# Patient Record
Sex: Female | Born: 1955 | Race: White | Hispanic: No | Marital: Married | Smoking: Never smoker
Health system: Southern US, Community
[De-identification: ages and names within clinical notes are randomized; demographics above are authoritative.]

## PROBLEM LIST (undated history)

## (undated) DIAGNOSIS — E785 Hyperlipidemia, unspecified: Secondary | ICD-10-CM

## (undated) DIAGNOSIS — Z87442 Personal history of urinary calculi: Secondary | ICD-10-CM

## (undated) DIAGNOSIS — I10 Essential (primary) hypertension: Secondary | ICD-10-CM

## (undated) DIAGNOSIS — N6310 Unspecified lump in the right breast, unspecified quadrant: Secondary | ICD-10-CM

## (undated) DIAGNOSIS — E119 Type 2 diabetes mellitus without complications: Secondary | ICD-10-CM

## (undated) HISTORY — PX: TUBAL LIGATION: SHX77

## (undated) HISTORY — DX: Essential (primary) hypertension: I10

## (undated) HISTORY — PX: ABDOMINAL HYSTERECTOMY: SHX81

## (undated) HISTORY — PX: OTHER SURGICAL HISTORY: SHX169

## (undated) HISTORY — DX: Hyperlipidemia, unspecified: E78.5

---

## 2004-06-15 ENCOUNTER — Other Ambulatory Visit: Admission: RE | Admit: 2004-06-15 | Discharge: 2004-06-15 | Payer: Self-pay | Admitting: Obstetrics & Gynecology

## 2012-08-21 ENCOUNTER — Other Ambulatory Visit: Payer: Self-pay

## 2012-08-21 MED ORDER — HYDROCHLOROTHIAZIDE 25 MG PO TABS: 25.0000 mg | ORAL_TABLET | Freq: Every day | ORAL | Status: DC

## 2012-12-21 ENCOUNTER — Ambulatory Visit: Payer: Self-pay | Admitting: General Practice

## 2012-12-25 ENCOUNTER — Other Ambulatory Visit: Payer: Self-pay | Admitting: Family Medicine

## 2012-12-26 NOTE — Telephone Encounter (Signed)
RX called in for HCTZ for one refill Pt must be seen for further refills

## 2012-12-26 NOTE — Telephone Encounter (Signed)
This is okay refill x1. She needs to make an appointment to be seen by a provider here.

## 2012-12-26 NOTE — Telephone Encounter (Signed)
Last seen 06/25/12   DWM 

## 2013-01-22 ENCOUNTER — Other Ambulatory Visit: Payer: Self-pay | Admitting: Family Medicine

## 2013-01-24 NOTE — Telephone Encounter (Signed)
Last seen 06/25/12   DWM

## 2013-01-24 NOTE — Telephone Encounter (Signed)
The patient needs to make an appointment for followup by a provider here in this practice

## 2013-01-25 NOTE — Telephone Encounter (Signed)
[facility] notified of rx for HCTZ

## 2013-02-07 ENCOUNTER — Ambulatory Visit: Payer: Self-pay | Admitting: General Practice

## 2013-02-08 ENCOUNTER — Encounter (INDEPENDENT_AMBULATORY_CARE_PROVIDER_SITE_OTHER): Payer: Self-pay

## 2013-02-08 ENCOUNTER — Encounter: Payer: Self-pay | Admitting: General Practice

## 2013-02-08 ENCOUNTER — Ambulatory Visit (INDEPENDENT_AMBULATORY_CARE_PROVIDER_SITE_OTHER): Payer: BC Managed Care – PPO | Admitting: General Practice

## 2013-02-08 VITALS — BP 116/72 | HR 77 | Temp 97.0°F | Ht 64.0 in | Wt 234.0 lb

## 2013-02-08 DIAGNOSIS — I10 Essential (primary) hypertension: Secondary | ICD-10-CM

## 2013-02-08 DIAGNOSIS — K219 Gastro-esophageal reflux disease without esophagitis: Secondary | ICD-10-CM

## 2013-02-08 DIAGNOSIS — M5430 Sciatica, unspecified side: Secondary | ICD-10-CM

## 2013-02-08 DIAGNOSIS — E785 Hyperlipidemia, unspecified: Secondary | ICD-10-CM

## 2013-02-08 DIAGNOSIS — M5431 Sciatica, right side: Secondary | ICD-10-CM

## 2013-02-08 DIAGNOSIS — M2550 Pain in unspecified joint: Secondary | ICD-10-CM

## 2013-02-08 MED ORDER — METHYLPREDNISOLONE ACETATE 80 MG/ML IJ SUSP
80.0000 mg | Freq: Once | INTRAMUSCULAR | Status: AC
  Administered 2013-02-08: 80 mg via INTRAMUSCULAR

## 2013-02-08 MED ORDER — RAMIPRIL 10 MG PO CAPS: 10.0000 mg | ORAL_CAPSULE | Freq: Every day | ORAL | Status: DC

## 2013-02-08 MED ORDER — SIMVASTATIN 80 MG PO TABS: 40.0000 mg | ORAL_TABLET | Freq: Every day | ORAL | Status: DC

## 2013-02-08 MED ORDER — HYDROCHLOROTHIAZIDE 25 MG PO TABS
25.0000 mg | ORAL_TABLET | Freq: Every day | ORAL | Status: DC
Start: 2013-02-08 — End: 2013-07-18

## 2013-02-08 NOTE — Progress Notes (Signed)
  Subjective:    Patient ID: Alexandria Tucker, female    DOB: 1955/10/09, 57 y.o.   MRN: [id number]  HPI Patient presents today for chronic health check. She has a history of hypertension and hyperlipidemia. Reports taking medications as prescribed. Reports lower back began hurting last night. She denies known injury and denies OTC medications. Reports ibuprofen alleviates the pain and taken as directed, OTC.    Review of Systems  Constitutional: Negative for fever and chills.  HENT: Negative for congestion.   Respiratory: Negative for cough, chest tightness, shortness of breath and wheezing.   Cardiovascular: Negative for chest pain and palpitations.  Gastrointestinal: Negative for abdominal pain, diarrhea, constipation and blood in stool.  Genitourinary: Negative for dysuria, hematuria and difficulty urinating.  Musculoskeletal: Positive for back pain. Negative for neck pain and neck stiffness.       Slight pain/tenderness  Neurological: Negative for dizziness, weakness and headaches.       Objective:   Physical Exam  Constitutional: She is oriented to person, place, and time. She appears well-developed and well-nourished.  HENT:  Head: Normocephalic and atraumatic.  Right Ear: External ear normal.  Left Ear: External ear normal.  Nose: Nose normal.  Mouth/Throat: Oropharynx is clear and moist.  Eyes: EOM are normal. Pupils are equal, round, and reactive to light.  Neck: Normal range of motion. Neck supple. No thyromegaly present.  Cardiovascular: Normal rate, regular rhythm and normal heart sounds.   Pulmonary/Chest: Effort normal and breath sounds normal. No respiratory distress. She exhibits no tenderness.  Abdominal: Soft. Bowel sounds are normal. She exhibits no distension. There is no tenderness.  Musculoskeletal: She exhibits tenderness. She exhibits no edema.  Lymphadenopathy:    She has no cervical adenopathy.  Neurological: She is alert and oriented to person, place,  and time.  Skin: Skin is warm and dry.  Psychiatric: She has a normal mood and affect.          Assessment & Plan:  1. Hypertension  - CMP14+EGFR - hydrochlorothiazide (HYDRODIURIL) 25 MG tablet; Take 1 tablet (25 mg total) by mouth daily.  Dispense: 30 tablet; Refill: 3 - ramipril (ALTACE) 10 MG capsule; Take 1 capsule (10 mg total) by mouth daily.  Dispense: 30 capsule; Refill: 3  2. Other and unspecified hyperlipidemia  - NMR, lipoprofile  3. Arthritic-like pain  - Arthritis Panel - simvastatin (ZOCOR) 80 MG tablet; Take 0.5 tablets (40 mg total) by mouth at bedtime.  Dispense: 30 tablet; Refill: 3  4. Sciatica, right  - methylPREDNISolone acetate (DEPO-MEDROL) injection 80 mg; Inject 1 mL (80 mg total) into the muscle once.  5. GERD (gastroesophageal reflux disease) -Continue all current medications Labs pending F/u in 3 months Discussed exercise and diet  Patient verbalized understanding Coralie Keens, FNP-C

## 2013-02-08 NOTE — Patient Instructions (Signed)
Sciatica °Sciatica is pain, weakness, numbness, or tingling along the path of the sciatic nerve. The nerve starts in the lower back and runs down the back of each leg. The nerve controls the muscles in the lower leg and in the back of the knee, while also providing sensation to the back of the thigh, lower leg, and the sole of your foot. Sciatica is a symptom of another medical condition. For instance, nerve damage or certain conditions, such as a herniated disk or bone spur on the spine, pinch or put pressure on the sciatic nerve. This causes the pain, weakness, or other sensations normally associated with sciatica. Generally, sciatica only affects one side of the body. °CAUSES  °· Herniated or slipped disc. °· Degenerative disk disease. °· A pain disorder involving the narrow muscle in the buttocks (piriformis syndrome). °· Pelvic injury or fracture. °· Pregnancy. °· Tumor (rare). °SYMPTOMS  °Symptoms can vary from mild to very severe. The symptoms usually travel from the low back to the buttocks and down the back of the leg. Symptoms can include: °· Mild tingling or dull aches in the lower back, leg, or hip. °· Numbness in the back of the calf or sole of the foot. °· Burning sensations in the lower back, leg, or hip. °· Sharp pains in the lower back, leg, or hip. °· Leg weakness. °· Severe back pain inhibiting movement. °These symptoms may get worse with coughing, sneezing, laughing, or prolonged sitting or standing. Also, being overweight may worsen symptoms. °DIAGNOSIS  °Your caregiver will perform a physical exam to look for common symptoms of sciatica. He or she may ask you to do certain movements or activities that would trigger sciatic nerve pain. Other tests may be performed to find the cause of the sciatica. These may include: °· Blood tests. °· X-rays. °· Imaging tests, such as an MRI or CT scan. °TREATMENT  °Treatment is directed at the cause of the sciatic pain. Sometimes, treatment is not necessary  and the pain and discomfort goes away on its own. If treatment is needed, your caregiver may suggest: °· Over-the-counter medicines to relieve pain. °· Prescription medicines, such as anti-inflammatory medicine, muscle relaxants, or narcotics. °· Applying heat or ice to the painful area. °· Steroid injections to lessen pain, irritation, and inflammation around the nerve. °· Reducing activity during periods of pain. °· Exercising and stretching to strengthen your abdomen and improve flexibility of your spine. Your caregiver may suggest losing weight if the extra weight makes the back pain worse. °· Physical therapy. °· Surgery to eliminate what is pressing or pinching the nerve, such as a bone spur or part of a herniated disk. °HOME CARE INSTRUCTIONS  °· Only take over-the-counter or prescription medicines for pain or discomfort as directed by your caregiver. °· Apply ice to the affected area for 20 minutes, 3 4 times a day for the first 48 72 hours. Then try heat in the same way. °· Exercise, stretch, or perform your usual activities if these do not aggravate your pain. °· Attend physical therapy sessions as directed by your caregiver. °· Keep all follow-up appointments as directed by your caregiver. °· Do not wear high heels or shoes that do not provide proper support. °· Check your mattress to see if it is too soft. A firm mattress may lessen your pain and discomfort. °SEEK IMMEDIATE MEDICAL CARE IF:  °· You lose control of your bowel or bladder (incontinence). °· You have increasing weakness in the lower back,   pelvis, buttocks, or legs. °· You have redness or swelling of your back. °· You have a burning sensation when you urinate. °· You have pain that gets worse when you lie down or awakens you at night. °· Your pain is worse than you have experienced in the past. °· Your pain is lasting longer than 4 weeks. °· You are suddenly losing weight without reason. °MAKE SURE YOU: °· Understand these  instructions. °· Will watch your condition. °· Will get help right away if you are not doing well or get worse. °Document Released: 04/12/2001 Document Revised: 10/18/2011 Document Reviewed: 08/28/2011 °[employer]® Patient Information ©2014 [employer]. ° °

## 2013-02-10 LAB — NMR, LIPOPROFILE
Cholesterol: 168 mg/dL (ref <200)
HDL Cholesterol by NMR: 35 mg/dL — ABNORMAL LOW (ref >=40)
HDL Particle Number: 33.2 umol/L (ref >=30.5)
LP-IR Score: 83 — ABNORMAL HIGH (ref <=45)
Small LDL Particle Number: 1691 nmol/L — ABNORMAL HIGH (ref <=527)

## 2013-02-10 LAB — CMP14+EGFR
AST: 43 IU/L — ABNORMAL HIGH (ref 0–40)
Albumin/Globulin Ratio: 1.7 (ref 1.1–2.5)
Alkaline Phosphatase: 92 IU/L (ref 39–117)
BUN/Creatinine Ratio: 25 — ABNORMAL HIGH (ref 9–23)
Creatinine, Ser: 0.73 mg/dL (ref 0.57–1.00)
GFR calc non Af Amer: 93 mL/min/{1.73_m2} (ref >59)
Globulin, Total: 2.7 g/dL (ref 1.5–4.5)
Sodium: 140 mmol/L (ref 134–144)

## 2013-02-10 LAB — ARTHRITIS PANEL: Uric Acid: 5 mg/dL (ref 2.5–7.1)

## 2013-02-15 ENCOUNTER — Telehealth: Payer: Self-pay | Admitting: *Deleted

## 2013-02-15 NOTE — Telephone Encounter (Signed)
Please review labs.  Call and leave results on vm and then mail a copy to her. Thanks.

## 2013-02-19 NOTE — Telephone Encounter (Signed)
Spoke with patient and reviewed labs. Patient verbalized understanding and denies questions.

## 2013-05-07 ENCOUNTER — Telehealth: Payer: Self-pay | Admitting: General Practice

## 2013-05-07 NOTE — Telephone Encounter (Signed)
appt for Friday with Mae given

## 2013-05-10 ENCOUNTER — Ambulatory Visit (INDEPENDENT_AMBULATORY_CARE_PROVIDER_SITE_OTHER): Payer: BC Managed Care – PPO

## 2013-05-10 ENCOUNTER — Ambulatory Visit (INDEPENDENT_AMBULATORY_CARE_PROVIDER_SITE_OTHER): Payer: BC Managed Care – PPO | Admitting: General Practice

## 2013-05-10 ENCOUNTER — Encounter: Payer: Self-pay | Admitting: General Practice

## 2013-05-10 VITALS — BP 123/78 | HR 87 | Temp 97.7°F | Wt 241.0 lb

## 2013-05-10 DIAGNOSIS — M79604 Pain in right leg: Secondary | ICD-10-CM

## 2013-05-10 DIAGNOSIS — M5430 Sciatica, unspecified side: Secondary | ICD-10-CM

## 2013-05-10 DIAGNOSIS — M549 Dorsalgia, unspecified: Secondary | ICD-10-CM

## 2013-05-10 DIAGNOSIS — N390 Urinary tract infection, site not specified: Secondary | ICD-10-CM

## 2013-05-10 DIAGNOSIS — M79609 Pain in unspecified limb: Secondary | ICD-10-CM

## 2013-05-10 DIAGNOSIS — M5431 Sciatica, right side: Secondary | ICD-10-CM

## 2013-05-10 LAB — POCT UA - MICROSCOPIC ONLY
BACTERIA, U MICROSCOPIC: NEGATIVE
Casts, Ur, LPF, POC: NEGATIVE
Crystals, Ur, HPF, POC: NEGATIVE
Mucus, UA: NEGATIVE
RBC, URINE, MICROSCOPIC: NEGATIVE

## 2013-05-10 LAB — POCT URINALYSIS DIPSTICK
BILIRUBIN UA: NEGATIVE
GLUCOSE UA: 250
KETONES UA: NEGATIVE
NITRITE UA: NEGATIVE
Protein, UA: NEGATIVE
RBC UA: NEGATIVE
Spec Grav, UA: 1.025
Urobilinogen, UA: NEGATIVE
pH, UA: 7

## 2013-05-10 MED ORDER — METAXALONE 800 MG PO TABS: 800.0000 mg | ORAL_TABLET | Freq: Three times a day (TID) | ORAL | Status: DC

## 2013-05-10 MED ORDER — PREDNISONE (PAK) 10 MG PO TABS: ORAL_TABLET | ORAL | Status: DC

## 2013-05-10 MED ORDER — CIPROFLOXACIN HCL 500 MG PO TABS: 500.0000 mg | ORAL_TABLET | Freq: Two times a day (BID) | ORAL | Status: DC

## 2013-05-10 MED ORDER — METHYLPREDNISOLONE ACETATE 80 MG/ML IJ SUSP
80.0000 mg | Freq: Once | INTRAMUSCULAR | Status: AC
  Administered 2013-05-10: 80 mg via INTRAMUSCULAR

## 2013-05-10 NOTE — Patient Instructions (Addendum)
Sciatica °Sciatica is pain, weakness, numbness, or tingling along the path of the sciatic nerve. The nerve starts in the lower back and runs down the back of each leg. The nerve controls the muscles in the lower leg and in the back of the knee, while also providing sensation to the back of the thigh, lower leg, and the sole of your foot. Sciatica is a symptom of another medical condition. For instance, nerve damage or certain conditions, such as a herniated disk or bone spur on the spine, pinch or put pressure on the sciatic nerve. This causes the pain, weakness, or other sensations normally associated with sciatica. Generally, sciatica only affects one side of the body. °CAUSES  °· Herniated or slipped disc. °· Degenerative disk disease. °· A pain disorder involving the narrow muscle in the buttocks (piriformis syndrome). °· Pelvic injury or fracture. °· Pregnancy. °· Tumor (rare). °SYMPTOMS  °Symptoms can vary from mild to very severe. The symptoms usually travel from the low back to the buttocks and down the back of the leg. Symptoms can include: °· Mild tingling or dull aches in the lower back, leg, or hip. °· Numbness in the back of the calf or sole of the foot. °· Burning sensations in the lower back, leg, or hip. °· Sharp pains in the lower back, leg, or hip. °· Leg weakness. °· Severe back pain inhibiting movement. °These symptoms may get worse with coughing, sneezing, laughing, or prolonged sitting or standing. Also, being overweight may worsen symptoms. °DIAGNOSIS  °Your caregiver will perform a physical exam to look for common symptoms of sciatica. He or she may ask you to do certain movements or activities that would trigger sciatic nerve pain. Other tests may be performed to find the cause of the sciatica. These may include: °· Blood tests. °· X-rays. °· Imaging tests, such as an MRI or CT scan. °TREATMENT  °Treatment is directed at the cause of the sciatic pain. Sometimes, treatment is not necessary  and the pain and discomfort goes away on its own. If treatment is needed, your caregiver may suggest: °· Over-the-counter medicines to relieve pain. °· Prescription medicines, such as anti-inflammatory medicine, muscle relaxants, or narcotics. °· Applying heat or ice to the painful area. °· Steroid injections to lessen pain, irritation, and inflammation around the nerve. °· Reducing activity during periods of pain. °· Exercising and stretching to strengthen your abdomen and improve flexibility of your spine. Your caregiver may suggest losing weight if the extra weight makes the back pain worse. °· Physical therapy. °· Surgery to eliminate what is pressing or pinching the nerve, such as a bone spur or part of a herniated disk. °HOME CARE INSTRUCTIONS  °· Only take over-the-counter or prescription medicines for pain or discomfort as directed by your caregiver. °· Apply ice to the affected area for 20 minutes, 3 4 times a day for the first 48 72 hours. Then try heat in the same way. °· Exercise, stretch, or perform your usual activities if these do not aggravate your pain. °· Attend physical therapy sessions as directed by your caregiver. °· Keep all follow-up appointments as directed by your caregiver. °· Do not wear high heels or shoes that do not provide proper support. °· Check your mattress to see if it is too soft. A firm mattress may lessen your pain and discomfort. °SEEK IMMEDIATE MEDICAL CARE IF:  °· You lose control of your bowel or bladder (incontinence). °· You have increasing weakness in the lower back,   pelvis, buttocks, or legs. °· You have redness or swelling of your back. °· You have a burning sensation when you urinate. °· You have pain that gets worse when you lie down or awakens you at night. °· Your pain is worse than you have experienced in the past. °· Your pain is lasting longer than 4 weeks. °· You are suddenly losing weight without reason. °MAKE SURE YOU: °· Understand these  instructions. °· Will watch your condition. °· Will get help right away if you are not doing well or get worse. °Document Released: 04/12/2001 Document Revised: 10/18/2011 Document Reviewed: 08/28/2011 °[employer]® Patient Information ©2014 [employer]. ° °

## 2013-05-10 NOTE — Progress Notes (Signed)
Subjective:    Patient ID: Alexandria Tucker, female    DOB: Sep 12, 1955, 58 y.o.   MRN: [id number]  Back Pain This is a recurrent problem. The current episode started more than 1 month ago (onset 3 months ago, worse over past couple days). The problem has been waxing and waning since onset. The pain is present in the sacro-iliac. The quality of the pain is described as aching. The pain radiates to the right thigh. The pain is the same all the time. The symptoms are aggravated by twisting. Associated symptoms include leg pain. Pertinent negatives include no bladder incontinence, bowel incontinence, chest pain, dysuria or numbness. She has tried NSAIDs and heat for the symptoms.      Review of Systems  Respiratory: Negative for chest tightness and shortness of breath.   Cardiovascular: Negative for chest pain.  Gastrointestinal: Negative for bowel incontinence.  Genitourinary: Negative for bladder incontinence and dysuria.  Musculoskeletal: Positive for back pain.  Neurological: Negative for numbness.       Objective:   Physical Exam  Constitutional: She is oriented to person, place, and time. She appears well-developed and well-nourished.  Cardiovascular: Normal rate, regular rhythm and normal heart sounds.   Pulmonary/Chest: Effort normal and breath sounds normal. No respiratory distress. She exhibits no tenderness.  Abdominal: Soft. Bowel sounds are normal. She exhibits no distension. There is no tenderness.  Neurological: She is alert and oriented to person, place, and time.  Skin: Skin is warm and dry.  Psychiatric: She has a normal mood and affect.      Results for orders placed in visit on 05/10/13  POCT URINALYSIS DIPSTICK      Result Value Range   Color, UA yellow     Clarity, UA clear     Glucose, UA 250     Bilirubin, UA neg     Ketones, UA neg     Spec Grav, UA 1.025     Blood, UA neg     pH, UA 7.0     Protein, UA neg     Urobilinogen, UA negative     Nitrite, UA neg     Leukocytes, UA moderate (2+)    POCT UA - MICROSCOPIC ONLY      Result Value Range   WBC, Ur, HPF, POC 10-15     RBC, urine, microscopic neg     Bacteria, U Microscopic neg     Mucus, UA neg     Epithelial cells, urine per micros occ     Crystals, Ur, HPF, POC neg     Casts, Ur, LPF, POC neg     Yeast, UA rare     [facility] reading (PRIMARY) by Erby Pian, FNP-C, degenerative disc disease, curvature noted.      Assessment & Plan:  1. Back pain  - POCT urinalysis dipstick - POCT UA - Microscopic Only - DG Lumbar Spine Complete; Future - metaxalone (SKELAXIN) 800 MG tablet; Take 1 tablet (800 mg total) by mouth 3 (three) times daily.  Dispense: 30 tablet; Refill: 0  2. Right leg pain  - DG Lumbar Spine Complete; Future  3. Sciatica of right side  - methylPREDNISolone acetate (DEPO-MEDROL) injection 80 mg; Inject 1 mL (80 mg total) into the muscle once. - predniSONE (STERAPRED UNI-PAK) 10 MG tablet; Start on 05/11/13  Dispense: 21 tablet; Refill: 0 -if symptoms worsen or unresolved, patient to contact her ortho specialist, will refer if needed  4. UTI (urinary tract infection)  -  ciprofloxacin (CIPRO) 500 MG tablet; Take 1 tablet (500 mg total) by mouth 2 (two) times daily.  Dispense: 20 tablet; Refill: 0 -Patient education discussed and provided (UTI, Sciatica) RTO if symptoms worsen or no improvement may seek emergency medical treatment Patient verbalized understanding Erby Pian, FNP-C

## 2013-05-16 ENCOUNTER — Telehealth: Payer: Self-pay | Admitting: General Practice

## 2013-05-16 ENCOUNTER — Other Ambulatory Visit: Payer: Self-pay | Admitting: General Practice

## 2013-05-16 DIAGNOSIS — M549 Dorsalgia, unspecified: Secondary | ICD-10-CM

## 2013-05-16 DIAGNOSIS — N390 Urinary tract infection, site not specified: Secondary | ICD-10-CM

## 2013-05-16 MED ORDER — KETOROLAC TROMETHAMINE 10 MG PO TABS: 10.0000 mg | ORAL_TABLET | Freq: Four times a day (QID) | ORAL | Status: DC | PRN

## 2013-05-16 MED ORDER — NITROFURANTOIN MONOHYD MACRO 100 MG PO CAPS: 100.0000 mg | ORAL_CAPSULE | Freq: Two times a day (BID) | ORAL | Status: DC

## 2013-05-16 NOTE — Telephone Encounter (Signed)
Spoke with patient and informed that macrobid, for uti and toradol for back pain has been sent to pharmacy. Discussed referral to ortho specialist and she verbalized, she will contact ortho specialist used in the past for appointment. She will inform me if referral is needed.

## 2013-05-17 ENCOUNTER — Telehealth: Payer: Self-pay | Admitting: General Practice

## 2013-05-17 ENCOUNTER — Other Ambulatory Visit: Payer: Self-pay | Admitting: General Practice

## 2013-05-17 DIAGNOSIS — M549 Dorsalgia, unspecified: Secondary | ICD-10-CM

## 2013-05-17 MED ORDER — NAPROXEN 500 MG PO TABS: 500.0000 mg | ORAL_TABLET | Freq: Two times a day (BID) | ORAL | Status: DC

## 2013-05-17 NOTE — Telephone Encounter (Signed)
Patient aware.

## 2013-05-17 NOTE — Telephone Encounter (Signed)
Please inform naproxen called into pharmacy for pain.

## 2013-06-02 LAB — HM MAMMOGRAPHY

## 2013-06-21 ENCOUNTER — Encounter: Payer: Self-pay | Admitting: Nurse Practitioner

## 2013-06-21 ENCOUNTER — Ambulatory Visit (INDEPENDENT_AMBULATORY_CARE_PROVIDER_SITE_OTHER): Payer: BC Managed Care – PPO | Admitting: Nurse Practitioner

## 2013-06-21 VITALS — BP 121/77 | HR 81 | Temp 97.1°F | Ht 64.0 in | Wt 241.0 lb

## 2013-06-21 DIAGNOSIS — I152 Hypertension secondary to endocrine disorders: Secondary | ICD-10-CM | POA: Insufficient documentation

## 2013-06-21 DIAGNOSIS — E1159 Type 2 diabetes mellitus with other circulatory complications: Secondary | ICD-10-CM | POA: Insufficient documentation

## 2013-06-21 DIAGNOSIS — J209 Acute bronchitis, unspecified: Secondary | ICD-10-CM

## 2013-06-21 DIAGNOSIS — E1169 Type 2 diabetes mellitus with other specified complication: Secondary | ICD-10-CM | POA: Insufficient documentation

## 2013-06-21 DIAGNOSIS — I10 Essential (primary) hypertension: Secondary | ICD-10-CM

## 2013-06-21 DIAGNOSIS — E785 Hyperlipidemia, unspecified: Secondary | ICD-10-CM

## 2013-06-21 DIAGNOSIS — J0100 Acute maxillary sinusitis, unspecified: Secondary | ICD-10-CM

## 2013-06-21 MED ORDER — HYDROCODONE-HOMATROPINE 5-1.5 MG/5ML PO SYRP: 5.0000 mL | ORAL_SOLUTION | Freq: Three times a day (TID) | ORAL | Status: DC | PRN

## 2013-06-21 MED ORDER — METHYLPREDNISOLONE ACETATE 80 MG/ML IJ SUSP
80.0000 mg | Freq: Once | INTRAMUSCULAR | Status: AC
  Administered 2013-06-21: 80 mg via INTRAMUSCULAR

## 2013-06-21 MED ORDER — AZITHROMYCIN 250 MG PO TABS: ORAL_TABLET | ORAL | Status: DC

## 2013-06-21 NOTE — Patient Instructions (Signed)

## 2013-06-21 NOTE — Progress Notes (Signed)
Subjective:    Patient ID: Alexandria Tucker, female    DOB: [birth year], 58 y.o.   MRN: [id number]  HPI  Patient in today c/o bil ear pain, sinus pressure and coughing- STarted last weekend and has gradually gotten worse.    Review of Systems  Constitutional: Negative for fever and chills.  HENT: Positive for congestion, ear pain, rhinorrhea and sinus pressure. Negative for sore throat and trouble swallowing.   Respiratory: Positive for cough (nonproductive).   Cardiovascular: Negative.   Gastrointestinal: Negative.   Genitourinary: Negative.   Musculoskeletal: Negative.   All other systems reviewed and are negative.       Objective:   Physical Exam  Constitutional: She is oriented to person, place, and time. She appears well-developed and well-nourished.  HENT:  Right Ear: Hearing, tympanic membrane, external ear and ear canal normal.  Left Ear: External ear and ear canal normal. Tympanic membrane is erythematous.  Nose: Mucosal edema and rhinorrhea present. Right sinus exhibits no maxillary sinus tenderness and no frontal sinus tenderness. Left sinus exhibits maxillary sinus tenderness. Left sinus exhibits no frontal sinus tenderness.  Mouth/Throat: Uvula is midline, oropharynx is clear and moist and mucous membranes are normal.  Eyes: Pupils are equal, round, and reactive to light.  Neck: Normal range of motion. Neck supple.  Cardiovascular: Normal rate, regular rhythm and normal heart sounds.   Pulmonary/Chest: Effort normal and breath sounds normal.  Lymphadenopathy:    She has no cervical adenopathy.  Neurological: She is alert and oriented to person, place, and time.  Skin: Skin is warm.  Psychiatric: She has a normal mood and affect. Her behavior is normal. Judgment and thought content normal.    BP 121/77  Pulse 81  Temp(Src) 97.1 F (36.2 C) (Oral)  Ht 5\' 4"  (1.626 m)  Wt 241 lb (109.317 kg)  BMI 41.35 kg/m2.       Assessment & Plan:   1. Sinusitis,  acute maxillary   2. Acute bronchitis    Meds ordered this encounter  Medications  . azithromycin (ZITHROMAX Z-PAK) 250 MG tablet    Sig: As directed    Dispense:  6 each    Refill:  0    Order Specific Question:  Supervising Provider    Answer:  Chipper Herb [[id number]]  . HYDROcodone-homatropine (HYCODAN) 5-1.5 MG/5ML syrup    Sig: Take 5 mLs by mouth every 8 (eight) hours as needed for cough.    Dispense:  120 mL    Refill:  0    Order Specific Question:  Supervising Provider    Answer:  Chipper Herb [[id number]]  . methylPREDNISolone acetate (DEPO-MEDROL) injection 80 mg    Sig:    1. Take meds as prescribed 2. Use a cool mist humidifier especially during the winter months and when heat has been humid. 3. Use saline nose sprays frequently 4. Saline irrigations of the nose can be very helpful if done frequently.  * 4X daily for 1 week*  * Use of a nettie pot can be helpful with this. Follow directions with this* 5. Drink plenty of fluids 6. Keep thermostat turn down low 7.For any cough or congestion  Use plain Mucinex- regular strength or max strength is fine   * Children- consult with Pharmacist for dosing 8. For fever or aces or pains- take tylenol or ibuprofen appropriate for age and weight.  * for fevers greater than 101 orally you may alternate ibuprofen and tylenol every  3 hours.  Mary-Margaret Hassell Done, FNP

## 2013-07-18 ENCOUNTER — Other Ambulatory Visit: Payer: Self-pay | Admitting: *Deleted

## 2013-07-18 DIAGNOSIS — I10 Essential (primary) hypertension: Secondary | ICD-10-CM

## 2013-07-18 MED ORDER — HYDROCHLOROTHIAZIDE 25 MG PO TABS: 25.0000 mg | ORAL_TABLET | Freq: Every day | ORAL | Status: DC

## 2013-07-23 ENCOUNTER — Encounter: Payer: Self-pay | Admitting: *Deleted

## 2013-08-07 ENCOUNTER — Ambulatory Visit: Payer: BC Managed Care – PPO | Admitting: General Practice

## 2013-08-08 ENCOUNTER — Encounter: Payer: Self-pay | Admitting: *Deleted

## 2013-08-09 ENCOUNTER — Ambulatory Visit: Payer: BC Managed Care – PPO | Admitting: General Practice

## 2013-08-11 ENCOUNTER — Other Ambulatory Visit: Payer: Self-pay | Admitting: General Practice

## 2013-08-13 ENCOUNTER — Telehealth: Payer: Self-pay | Admitting: General Practice

## 2013-08-28 ENCOUNTER — Ambulatory Visit: Payer: BC Managed Care – PPO | Admitting: General Practice

## 2013-09-06 ENCOUNTER — Ambulatory Visit: Payer: BC Managed Care – PPO | Admitting: General Practice

## 2013-09-09 ENCOUNTER — Other Ambulatory Visit: Payer: Self-pay | Admitting: General Practice

## 2013-10-20 ENCOUNTER — Other Ambulatory Visit: Payer: Self-pay | Admitting: Nurse Practitioner

## 2013-11-05 ENCOUNTER — Other Ambulatory Visit: Payer: Self-pay | Admitting: Nurse Practitioner

## 2013-11-07 NOTE — Telephone Encounter (Signed)
Last ov 1/15. 

## 2013-11-07 NOTE — Telephone Encounter (Signed)
Patient NTBS for follow up and lab work  

## 2013-11-24 ENCOUNTER — Other Ambulatory Visit: Payer: Self-pay | Admitting: Family Medicine

## 2013-12-07 ENCOUNTER — Other Ambulatory Visit: Payer: Self-pay | Admitting: General Practice

## 2013-12-09 NOTE — Telephone Encounter (Signed)
Cannot fill statin until patient has blood work

## 2013-12-09 NOTE — Telephone Encounter (Signed)
Last seen 06/03/13  MMM Last lipid 02/08/13

## 2013-12-22 ENCOUNTER — Other Ambulatory Visit: Payer: Self-pay | Admitting: General Practice

## 2013-12-27 ENCOUNTER — Encounter: Payer: Self-pay | Admitting: Family

## 2013-12-27 ENCOUNTER — Ambulatory Visit (INDEPENDENT_AMBULATORY_CARE_PROVIDER_SITE_OTHER): Payer: BC Managed Care – PPO | Admitting: Family

## 2013-12-27 VITALS — BP 120/73 | HR 76 | Temp 97.0°F | Ht 64.0 in | Wt 236.2 lb

## 2013-12-27 DIAGNOSIS — E785 Hyperlipidemia, unspecified: Secondary | ICD-10-CM

## 2013-12-27 DIAGNOSIS — I10 Essential (primary) hypertension: Secondary | ICD-10-CM

## 2013-12-27 DIAGNOSIS — Z1321 Encounter for screening for nutritional disorder: Secondary | ICD-10-CM

## 2013-12-27 DIAGNOSIS — L719 Rosacea, unspecified: Secondary | ICD-10-CM

## 2013-12-27 MED ORDER — RAMIPRIL 10 MG PO CAPS: ORAL_CAPSULE | ORAL | Status: DC

## 2013-12-27 MED ORDER — SIMVASTATIN 40 MG PO TABS: 40.0000 mg | ORAL_TABLET | Freq: Every day | ORAL | Status: DC

## 2013-12-27 MED ORDER — METRONIDAZOLE 0.75 % EX CREA: TOPICAL_CREAM | Freq: Two times a day (BID) | CUTANEOUS | Status: DC

## 2013-12-27 MED ORDER — HYDROCHLOROTHIAZIDE 25 MG PO TABS: ORAL_TABLET | ORAL | Status: DC

## 2013-12-27 NOTE — Progress Notes (Signed)
Subjective:    Patient ID: Alexandria Tucker, female    DOB: 31-Jul-1955, 58 y.o.   MRN: [id number]  Hypertension This is a chronic problem. The current episode started more than 1 year ago. The problem has been resolved since onset. The problem is controlled. Pertinent negatives include no anxiety, headaches, malaise/fatigue, palpitations, peripheral edema or shortness of breath. Risk factors for coronary artery disease include dyslipidemia, post-menopausal state, family history and obesity. Past treatments include ACE inhibitors and diuretics. The current treatment provides significant improvement. There is no history of kidney disease, CAD/MI, CVA, heart failure or a thyroid problem. There is no history of sleep apnea.  Hyperlipidemia This is a chronic problem. The current episode started more than 1 year ago. The problem is uncontrolled. Recent lipid tests were reviewed and are high. Exacerbating diseases include obesity. She has no history of hypothyroidism or liver disease. Pertinent negatives include no leg pain, myalgias or shortness of breath. Current antihyperlipidemic treatment includes statins. The current treatment provides mild improvement of lipids. Risk factors for coronary artery disease include dyslipidemia, family history, hypertension and obesity.      Review of Systems  Constitutional: Negative.  Negative for malaise/fatigue.  HENT: Negative.   Eyes: Negative.   Respiratory: Negative.  Negative for shortness of breath.   Cardiovascular: Negative.  Negative for palpitations.  Gastrointestinal: Negative.   Endocrine: Negative.   Genitourinary: Negative.   Musculoskeletal: Negative.  Negative for myalgias.  Neurological: Negative.  Negative for headaches.  Hematological: Negative.   Psychiatric/Behavioral: Negative.   All other systems reviewed and are negative.      Objective:   Physical Exam  Vitals reviewed. Constitutional: She is oriented to person, place, and  time. She appears well-developed and well-nourished. No distress.  HENT:  Head: Normocephalic and atraumatic.  Right Ear: External ear normal.  Mouth/Throat: Oropharynx is clear and moist.  Eyes: Pupils are equal, round, and reactive to light.  Neck: Normal range of motion. Neck supple. No thyromegaly present.  Cardiovascular: Normal rate, regular rhythm, normal heart sounds and intact distal pulses.   No murmur heard. Pulmonary/Chest: Effort normal and breath sounds normal. No respiratory distress. She has no wheezes.  Abdominal: Soft. Bowel sounds are normal. She exhibits no distension. There is no tenderness.  Musculoskeletal: Normal range of motion. She exhibits no edema and no tenderness.  Neurological: She is alert and oriented to person, place, and time. She has normal reflexes. No cranial nerve deficit.  Skin: Skin is warm and dry. There is erythema.  Psychiatric: She has a normal mood and affect. Her behavior is normal. Judgment and thought content normal.    BP 120/73  Pulse 76  Temp(Src) 97 F (36.1 C) (Oral)  Ht '5\' 4"'  (1.626 m)  Wt 236 lb 3.2 oz (107.14 kg)  BMI 40.52 kg/m2       Assessment & Plan:  1. Hyperlipidemia with target LDL less than 100 - Lipid panel - simvastatin (ZOCOR) 40 MG tablet; Take 1 tablet (40 mg total) by mouth at bedtime.  Dispense: 90 tablet; Refill: 3  2. Essential hypertension - CMP14+EGFR - ramipril (ALTACE) 10 MG capsule; TAKE ONE CAPSULE BY MOUTH ONCE DAILY  Dispense: 90 capsule; Refill: 3 - hydrochlorothiazide (HYDRODIURIL) 25 MG tablet; TAKE ONE TABLET BY MOUTH ONCE DAILY  Dispense: 90 tablet; Refill: 3  3. Encounter for vitamin deficiency screening - Vit D  25 hydroxy (rtn osteoporosis monitoring)  4. Rosacea - Sunscreen metroNIDAZOLE (METROCREAM) 0.75 % cream; Apply topically 2 (  two) times daily.  Dispense: 45 g; Refill: 0   Continue all meds Labs pending Health Maintenance reviewed Diet and exercise encouraged RTO 6  months  Evelina Dun, FNP

## 2013-12-27 NOTE — Patient Instructions (Signed)
Health Maintenance Adopting a healthy lifestyle and getting preventive care can go a long way to promote health and wellness. Talk with your health care provider about what schedule of regular examinations is right for you. This is a good chance for you to check in with your provider about disease prevention and staying healthy. In between checkups, there are plenty of things you can do on your own. Experts have done a lot of research about which lifestyle changes and preventive measures are most likely to keep you healthy. Ask your health care provider for more information. WEIGHT AND DIET  Eat a healthy diet  Be sure to include plenty of vegetables, fruits, low-fat dairy products, and lean protein.  Do not eat a lot of foods high in solid fats, added sugars, or salt.  Get regular exercise. This is one of the most important things you can do for your health.  Most adults should exercise for at least 150 minutes each week. The exercise should increase your heart rate and make you sweat (moderate-intensity exercise).  Most adults should also do strengthening exercises at least twice a week. This is in addition to the moderate-intensity exercise.  Maintain a healthy weight  Body mass index (BMI) is a measurement that can be used to identify possible weight problems. It estimates body fat based on height and weight. Your health care provider can help determine your BMI and help you achieve or maintain a healthy weight.  For females 25 years of age and older:   A BMI below 18.5 is considered underweight.  A BMI of 18.5 to 24.9 is normal.  A BMI of 25 to 29.9 is considered overweight.  A BMI of 30 and above is considered obese.  Watch levels of cholesterol and blood lipids  You should start having your blood tested for lipids and cholesterol at 56 years of age, then have this test every 5 years.  You may need to have your cholesterol levels checked more often if:  Your lipid or  cholesterol levels are high.  You are older than 56 years of age.  You are at high risk for heart disease.  CANCER SCREENING   Lung Cancer  Lung cancer screening is recommended for adults 97-92 years old who are at high risk for lung cancer because of a history of smoking.  A yearly low-dose CT scan of the lungs is recommended for people who:  Currently smoke.  Have quit within the past 15 years.  Have at least a 30-pack-year history of smoking. A pack year is smoking an average of one pack of cigarettes a day for 1 year.  Yearly screening should continue until it has been 15 years since you quit.  Yearly screening should stop if you develop a health problem that would prevent you from having lung cancer treatment.  Breast Cancer  Practice breast self-awareness. This means understanding how your breasts normally appear and feel.  It also means doing regular breast self-exams. Let your health care provider know about any changes, no matter how small.  If you are in your 20s or 30s, you should have a clinical breast exam (CBE) by a health care provider every 1-3 years as part of a regular health exam.  If you are 76 or older, have a CBE every year. Also consider having a breast X-ray (mammogram) every year.  If you have a family history of breast cancer, talk to your health care provider about genetic screening.  If you are  at high risk for breast cancer, talk to your health care provider about having an MRI and a mammogram every year.  Breast cancer gene (BRCA) assessment is recommended for women who have family members with BRCA-related cancers. BRCA-related cancers include:  Breast.  Ovarian.  Tubal.  Peritoneal cancers.  Results of the assessment will determine the need for genetic counseling and BRCA1 and BRCA2 testing. Cervical Cancer Routine pelvic examinations to screen for cervical cancer are no longer recommended for nonpregnant women who are considered low  risk for cancer of the pelvic organs (ovaries, uterus, and vagina) and who do not have symptoms. A pelvic examination may be necessary if you have symptoms including those associated with pelvic infections. Ask your health care provider if a screening pelvic exam is right for you.   The Pap test is the screening test for cervical cancer for women who are considered at risk.  If you had a hysterectomy for a problem that was not cancer or a condition that could lead to cancer, then you no longer need Pap tests.  If you are older than 65 years, and you have had normal Pap tests for the past 10 years, you no longer need to have Pap tests.  If you have had past treatment for cervical cancer or a condition that could lead to cancer, you need Pap tests and screening for cancer for at least 20 years after your treatment.  If you no longer get a Pap test, assess your risk factors if they change (such as having a new sexual partner). This can affect whether you should start being screened again.  Some women have medical problems that increase their chance of getting cervical cancer. If this is the case for you, your health care provider may recommend more frequent screening and Pap tests.  The human papillomavirus (HPV) test is another test that may be used for cervical cancer screening. The HPV test looks for the virus that can cause cell changes in the cervix. The cells collected during the Pap test can be tested for HPV.  The HPV test can be used to screen women 30 years of age and older. Getting tested for HPV can extend the interval between normal Pap tests from three to five years.  An HPV test also should be used to screen women of any age who have unclear Pap test results.  After 56 years of age, women should have HPV testing as often as Pap tests.  Colorectal Cancer  This type of cancer can be detected and often prevented.  Routine colorectal cancer screening usually begins at 56 years of  age and continues through 56 years of age.  Your health care provider may recommend screening at an earlier age if you have risk factors for colon cancer.  Your health care provider may also recommend using home test kits to check for hidden blood in the stool.  A small camera at the end of a tube can be used to examine your colon directly (sigmoidoscopy or colonoscopy). This is done to check for the earliest forms of colorectal cancer.  Routine screening usually begins at age 50.  Direct examination of the colon should be repeated every 5-10 years through 56 years of age. However, you may need to be screened more often if early forms of precancerous polyps or small growths are found. Skin Cancer  Check your skin from head to toe regularly.  Tell your health care provider about any new moles or changes in   moles, especially if there is a change in a mole's shape or color.  Also tell your health care provider if you have a mole that is larger than the size of a pencil eraser.  Always use sunscreen. Apply sunscreen liberally and repeatedly throughout the day.  Protect yourself by wearing long sleeves, pants, a wide-brimmed hat, and sunglasses whenever you are outside. HEART DISEASE, DIABETES, AND HIGH BLOOD PRESSURE   Have your blood pressure checked at least every 1-2 years. High blood pressure causes heart disease and increases the risk of stroke.  If you are between 75 years and 42 years old, ask your health care provider if you should take aspirin to prevent strokes.  Have regular diabetes screenings. This involves taking a blood sample to check your fasting blood sugar level.  If you are at a normal weight and have a low risk for diabetes, have this test once every three years after 56 years of age.  If you are overweight and have a high risk for diabetes, consider being tested at a younger age or more often. PREVENTING INFECTION  Hepatitis B  If you have a higher risk for  hepatitis B, you should be screened for this virus. You are considered at high risk for hepatitis B if:  You were born in a country where hepatitis B is common. Ask your health care provider which countries are considered high risk.  Your parents were born in a high-risk country, and you have not been immunized against hepatitis B (hepatitis B vaccine).  You have HIV or AIDS.  You use needles to inject street drugs.  You live with someone who has hepatitis B.  You have had sex with someone who has hepatitis B.  You get hemodialysis treatment.  You take certain medicines for conditions, including cancer, organ transplantation, and autoimmune conditions. Hepatitis C  Blood testing is recommended for:  Everyone born from [birth year] through 1963.  Anyone with known risk factors for hepatitis C. Sexually transmitted infections (STIs)  You should be screened for sexually transmitted infections (STIs) including gonorrhea and chlamydia if:  You are sexually active and are younger than 56 years of age.  You are older than 56 years of age and your health care provider tells you that you are at risk for this type of infection.  Your sexual activity has changed since you were last screened and you are at an increased risk for chlamydia or gonorrhea. Ask your health care provider if you are at risk.  If you do not have HIV, but are at risk, it may be recommended that you take a prescription medicine daily to prevent HIV infection. This is called pre-exposure prophylaxis (PrEP). You are considered at risk if:  You are sexually active and do not regularly use condoms or know the HIV status of your partner(s).  You take drugs by injection.  You are sexually active with a partner who has HIV. Talk with your health care provider about whether you are at high risk of being infected with HIV. If you choose to begin PrEP, you should first be tested for HIV. You should then be tested every 3 months for  as long as you are taking PrEP.  PREGNANCY   If you are premenopausal and you may become pregnant, ask your health care provider about preconception counseling.  If you may become pregnant, take 400 to 800 micrograms (mcg) of folic acid every day.  If you want to prevent pregnancy, talk to your  health care provider about birth control (contraception). OSTEOPOROSIS AND MENOPAUSE   Osteoporosis is a disease in which the bones lose minerals and strength with aging. This can result in serious bone fractures. Your risk for osteoporosis can be identified using a bone density scan.  If you are 68 years of age or older, or if you are at risk for osteoporosis and fractures, ask your health care provider if you should be screened.  Ask your health care provider whether you should take a calcium or vitamin D supplement to lower your risk for osteoporosis.  Menopause may have certain physical symptoms and risks.  Hormone replacement therapy may reduce some of these symptoms and risks. Talk to your health care provider about whether hormone replacement therapy is right for you.  HOME CARE INSTRUCTIONS   Schedule regular health, dental, and eye exams.  Stay current with your immunizations.   Do not use any tobacco products including cigarettes, chewing tobacco, or electronic cigarettes.  If you are pregnant, do not drink alcohol.  If you are breastfeeding, limit how much and how often you drink alcohol.  Limit alcohol intake to no more than 1 drink per day for nonpregnant women. One drink equals 12 ounces of beer, 5 ounces of wine, or 1 ounces of hard liquor.  Do not use street drugs.  Do not share needles.  Ask your health care provider for help if you need support or information about quitting drugs.  Tell your health care provider if you often feel depressed.  Tell your health care provider if you have ever been abused or do not feel safe at home. Document Released: 11/01/2010  Document Revised: 09/02/2013 Document Reviewed: 03/20/2013 [facility] Patient Information 2015 [place], [place]. This information is not intended to replace advice given to you by your health care provider. Make sure you discuss any questions you have with your health care provider. Rosacea Rosacea is a long-term (chronic) condition that affects the skin of the face (cheeks, nose, brow, and chin) and sometimes the eyes. Rosacea causes the blood vessels near the surface of the skin to enlarge, resulting in redness. This condition usually begins after age 101. It occurs most often in light-skinned women. Without treatment, rosacea tends to get worse over time. There is no cure for rosacea, but treatment can help control your symptoms. CAUSES  The cause is unknown. It is thought that some people may inherit a tendency to develop rosacea. Certain triggers can make your rosacea worse, including:  Hot baths.  Exercise.  Sunlight.  Very hot or cold temperatures.  Hot or spicy foods and drinks.  Drinking alcohol.  Stress.  Taking blood pressure medicine.  Long-term use of topical steroids on the face. SYMPTOMS   Redness of the face.  Red bumps or pimples on the face.  Red, enlarged nose (rhinophyma).  Blushing easily.  Red lines on the skin.  Irritated or burning feeling in the eyes.  Swollen eyelids. DIAGNOSIS  Your caregiver can usually tell what is wrong by asking about your symptoms and performing a physical exam. TREATMENT  Avoiding triggers is an important part of treatment. You will also need to see a skin specialist (dermatologist) who can develop a treatment plan for you. The goals of treatment are to control your condition and to improve the appearance of your skin. It may take several weeks or months of treatment before you notice an improvement in your skin. Even after your skin improves, you will likely need to continue treatment  to prevent your rosacea from coming back.  Treatment methods may include:  Using sunscreen or sunblock daily to protect the skin.  Antibiotic medicine, such as metronidazole, applied directly to the skin.  Antibiotics taken by mouth. This is usually prescribed if you have eye problems from your rosacea.  Laser surgery to improve the appearance of the skin. This surgery can reduce the appearance of red lines on the skin and can remove excess tissue from the nose to reduce its size. HOME CARE INSTRUCTIONS  Avoid things that seem to trigger your flare-ups.  If you are given antibiotics, take them as directed. Finish them even if you start to feel better.  Use a gentle facial cleanser that does not contain alcohol.  You may use a mild facial moisturizer.  Use a sunscreen or sunblock with SPF 30 or greater.  Wear a green-tinted foundation powder to conceal redness, if needed. Choose cosmetics that are noncomedogenic. This means they do not block your pores.  If your eyelids are affected, apply warm compresses to the eyelids. Do this up to 4 times a day or as directed by your caregiver. SEEK MEDICAL CARE IF:  Your skin problems get worse.  You feel depressed.  You lose your appetite.  You have trouble concentrating.  You have problems with your eyes, such as redness or itching. MAKE SURE YOU:  Understand these instructions.  Will watch your condition.  Will get help right away if you are not doing well or get worse. Document Released: 05/26/2004 Document Revised: 10/18/2011 Document Reviewed: 03/29/2011 [facility] Patient Information 2015 [place], [place]. This information is not intended to replace advice given to you by your health care provider. Make sure you discuss any questions you have with your health care provider.

## 2013-12-28 LAB — LIPID PANEL
CHOL/HDL RATIO: 6.3 ratio — AB (ref 0.0–4.4)
Cholesterol, Total: 165 mg/dL (ref 100–199)
HDL: 26 mg/dL — ABNORMAL LOW (ref >39)
LDL Calculated: 100 mg/dL — ABNORMAL HIGH (ref 0–99)
Triglycerides: 196 mg/dL — ABNORMAL HIGH (ref 0–149)
VLDL CHOLESTEROL CAL: 39 mg/dL (ref 5–40)

## 2013-12-28 LAB — CMP14+EGFR
ALK PHOS: 87 IU/L (ref 39–117)
ALT: 82 IU/L — ABNORMAL HIGH (ref 0–32)
AST: 54 IU/L — ABNORMAL HIGH (ref 0–40)
Albumin/Globulin Ratio: 1.8 (ref 1.1–2.5)
Albumin: 4.4 g/dL (ref 3.5–5.5)
BILIRUBIN TOTAL: 0.5 mg/dL (ref 0.0–1.2)
BUN / CREAT RATIO: 29 — AB (ref 9–23)
BUN: 19 mg/dL (ref 6–24)
CHLORIDE: 93 mmol/L — AB (ref 97–108)
CO2: 24 mmol/L (ref 18–29)
Calcium: 9.4 mg/dL (ref 8.7–10.2)
Creatinine, Ser: 0.65 mg/dL (ref 0.57–1.00)
GFR, EST AFRICAN AMERICAN: 115 mL/min/{1.73_m2} (ref >59)
GFR, EST NON AFRICAN AMERICAN: 100 mL/min/{1.73_m2} (ref >59)
Globulin, Total: 2.4 g/dL (ref 1.5–4.5)
Glucose: 248 mg/dL — ABNORMAL HIGH (ref 65–99)
Potassium: 3.6 mmol/L (ref 3.5–5.2)
Sodium: 135 mmol/L (ref 134–144)
Total Protein: 6.8 g/dL (ref 6.0–8.5)

## 2013-12-28 LAB — VITAMIN D 25 HYDROXY (VIT D DEFICIENCY, FRACTURES): Vit D, 25-Hydroxy: 42.4 ng/mL (ref 30.0–100.0)

## 2013-12-30 ENCOUNTER — Other Ambulatory Visit: Payer: Self-pay | Admitting: Family

## 2013-12-30 DIAGNOSIS — R739 Hyperglycemia, unspecified: Secondary | ICD-10-CM

## 2013-12-30 MED ORDER — ATORVASTATIN CALCIUM 40 MG PO TABS: 40.0000 mg | ORAL_TABLET | Freq: Every day | ORAL | Status: DC

## 2013-12-31 ENCOUNTER — Other Ambulatory Visit (INDEPENDENT_AMBULATORY_CARE_PROVIDER_SITE_OTHER): Payer: BC Managed Care – PPO

## 2013-12-31 DIAGNOSIS — R739 Hyperglycemia, unspecified: Secondary | ICD-10-CM

## 2013-12-31 DIAGNOSIS — R7309 Other abnormal glucose: Secondary | ICD-10-CM

## 2013-12-31 LAB — POCT GLYCOSYLATED HEMOGLOBIN (HGB A1C): Hemoglobin A1C: 8.7

## 2013-12-31 NOTE — Progress Notes (Signed)
Lab only 

## 2014-01-02 ENCOUNTER — Other Ambulatory Visit: Payer: Self-pay | Admitting: Family

## 2014-01-02 MED ORDER — METFORMIN HCL 500 MG PO TABS: 500.0000 mg | ORAL_TABLET | Freq: Two times a day (BID) | ORAL | Status: DC

## 2014-05-13 ENCOUNTER — Encounter: Payer: Self-pay | Admitting: *Deleted

## 2014-07-04 ENCOUNTER — Encounter: Payer: Self-pay | Admitting: Family

## 2014-07-04 ENCOUNTER — Ambulatory Visit (INDEPENDENT_AMBULATORY_CARE_PROVIDER_SITE_OTHER): Payer: Managed Care, Other (non HMO) | Admitting: Family

## 2014-07-04 VITALS — BP 119/66 | HR 73 | Temp 96.5°F | Ht 64.0 in | Wt 226.0 lb

## 2014-07-04 DIAGNOSIS — Z23 Encounter for immunization: Secondary | ICD-10-CM

## 2014-07-04 DIAGNOSIS — J069 Acute upper respiratory infection, unspecified: Secondary | ICD-10-CM | POA: Diagnosis not present

## 2014-07-04 DIAGNOSIS — E785 Hyperlipidemia, unspecified: Secondary | ICD-10-CM | POA: Diagnosis not present

## 2014-07-04 DIAGNOSIS — Z1321 Encounter for screening for nutritional disorder: Secondary | ICD-10-CM

## 2014-07-04 DIAGNOSIS — I10 Essential (primary) hypertension: Secondary | ICD-10-CM

## 2014-07-04 DIAGNOSIS — E119 Type 2 diabetes mellitus without complications: Secondary | ICD-10-CM

## 2014-07-04 LAB — POCT UA - MICROALBUMIN: Microalbumin Ur, POC: 20 mg/L

## 2014-07-04 LAB — POCT GLYCOSYLATED HEMOGLOBIN (HGB A1C): Hemoglobin A1C: 7.4

## 2014-07-04 MED ORDER — ATORVASTATIN CALCIUM 40 MG PO TABS: 40.0000 mg | ORAL_TABLET | Freq: Every day | ORAL | Status: DC

## 2014-07-04 MED ORDER — RAMIPRIL 10 MG PO CAPS: ORAL_CAPSULE | ORAL | Status: DC

## 2014-07-04 MED ORDER — BENZONATATE 200 MG PO CAPS: 200.0000 mg | ORAL_CAPSULE | Freq: Three times a day (TID) | ORAL | Status: DC | PRN

## 2014-07-04 MED ORDER — AZITHROMYCIN 250 MG PO TABS: ORAL_TABLET | ORAL | Status: DC

## 2014-07-04 MED ORDER — METFORMIN HCL 500 MG PO TABS: 500.0000 mg | ORAL_TABLET | Freq: Two times a day (BID) | ORAL | Status: DC

## 2014-07-04 MED ORDER — FEXOFENADINE HCL 180 MG PO TABS
180.0000 mg | ORAL_TABLET | Freq: Every day | ORAL | Status: DC
Start: 2014-07-04 — End: 2016-01-27

## 2014-07-04 MED ORDER — HYDROCHLOROTHIAZIDE 25 MG PO TABS: ORAL_TABLET | ORAL | Status: DC

## 2014-07-04 NOTE — Progress Notes (Signed)
Subjective:    Patient ID: Alexandria Tucker, female    DOB: Mar 24, 1956, 59 y.o.   MRN: [id number]  Hypertension This is a chronic problem. The current episode started more than 1 year ago. The problem has been resolved since onset. The problem is controlled. Pertinent negatives include no anxiety, blurred vision, headaches, malaise/fatigue, palpitations, peripheral edema or shortness of breath. Risk factors for coronary artery disease include dyslipidemia, obesity, post-menopausal state and family history. Past treatments include ACE inhibitors and diuretics. The current treatment provides significant improvement. There is no history of kidney disease, CAD/MI, CVA, heart failure or a thyroid problem. There is no history of sleep apnea.  Hyperlipidemia This is a chronic problem. The current episode started more than 1 year ago. The problem is controlled. Recent lipid tests were reviewed and are normal. Exacerbating diseases include diabetes. She has no history of hypothyroidism. Factors aggravating her hyperlipidemia include fatty foods. Pertinent negatives include no leg pain, myalgias or shortness of breath. Current antihyperlipidemic treatment includes statins. The current treatment provides moderate improvement of lipids. Risk factors for coronary artery disease include diabetes mellitus, dyslipidemia, family history, hypertension, obesity and a sedentary lifestyle.  Diabetes She presents for her follow-up diabetic visit. She has type 2 diabetes mellitus. Her disease course has been stable. Pertinent negatives for hypoglycemia include no confusion, headaches, mood changes or pallor. Pertinent negatives for diabetes include no blurred vision, no foot paresthesias, no foot ulcerations and no visual change. Pertinent negatives for hypoglycemia complications include no blackouts and no hospitalization. Symptoms are stable. Pertinent negatives for diabetic complications include no CVA, heart disease,  nephropathy or peripheral neuropathy. Risk factors for coronary artery disease include diabetes mellitus, dyslipidemia, family history, hypertension, obesity and post-menopausal. Current diabetic treatment includes oral agent (monotherapy). She is compliant with treatment all of the time. Her breakfast blood glucose range is generally 110-130 mg/dl. An ACE inhibitor/angiotensin II receptor blocker is being taken.  Cough This is a new problem. The current episode started in the past 7 days. The problem has been unchanged. The problem occurs constantly. The cough is productive of sputum. Pertinent negatives include no headaches, myalgias or shortness of breath. The treatment provided moderate relief. There is no history of asthma or COPD.      Review of Systems  Constitutional: Negative.  Negative for malaise/fatigue.  HENT: Negative.   Eyes: Negative.  Negative for blurred vision.  Respiratory: Positive for cough. Negative for shortness of breath.   Cardiovascular: Negative.  Negative for palpitations.  Gastrointestinal: Negative.   Endocrine: Negative.   Genitourinary: Negative.   Musculoskeletal: Negative.  Negative for myalgias.  Skin: Negative for pallor.  Neurological: Negative.  Negative for headaches.  Hematological: Negative.   Psychiatric/Behavioral: Negative.  Negative for confusion.  All other systems reviewed and are negative.      Objective:   Physical Exam  Constitutional: She is oriented to person, place, and time. She appears well-developed and well-nourished. No distress.  HENT:  Head: Normocephalic and atraumatic.  Right Ear: External ear normal.  Left Ear: External ear normal.  Nose: Nose normal.  Mouth/Throat: Oropharynx is clear and moist.  Eyes: Pupils are equal, round, and reactive to light.  Neck: Normal range of motion. Neck supple. No thyromegaly present.  Cardiovascular: Normal rate, regular rhythm, normal heart sounds and intact distal pulses.   No  murmur heard. Pulmonary/Chest: Effort normal and breath sounds normal. No respiratory distress. She has no wheezes.  Abdominal: Soft. Bowel sounds are normal. She exhibits  no distension. There is no tenderness.  Musculoskeletal: Normal range of motion. She exhibits no edema or tenderness.  Neurological: She is alert and oriented to person, place, and time. She has normal reflexes. No cranial nerve deficit.  Skin: Skin is warm and dry.  Psychiatric: She has a normal mood and affect. Her behavior is normal. Judgment and thought content normal.  Vitals reviewed.   See diabetic foot note  BP 119/66 mmHg  Pulse 73  Temp(Src) 96.5 F (35.8 C) (Oral)  Ht 5' 4" (1.626 m)  Wt 226 lb (102.513 kg)  BMI 38.77 kg/m2     Assessment & Plan:  1. Essential hypertension - CMP14+EGFR - ramipril (ALTACE) 10 MG capsule; TAKE ONE CAPSULE BY MOUTH ONCE DAILY  Dispense: 90 capsule; Refill: 3 - hydrochlorothiazide (HYDRODIURIL) 25 MG tablet; TAKE ONE TABLET BY MOUTH ONCE DAILY  Dispense: 90 tablet; Refill: 3  2. Hyperlipidemia with target LDL less than 100 - CMP14+EGFR - Lipid panel - atorvastatin (LIPITOR) 40 MG tablet; Take 1 tablet (40 mg total) by mouth daily.  Dispense: 90 tablet; Refill: 3  3. Type 2 diabetes mellitus without complication - POCT glycosylated hemoglobin (Hb A1C) - CMP14+EGFR - POCT UA - Microalbumin - metFORMIN (GLUCOPHAGE) 500 MG tablet; Take 1 tablet (500 mg total) by mouth 2 (two) times daily with a meal.  Dispense: 180 tablet; Refill: 3  4. Encounter for vitamin deficiency screening - Vit D  25 hydroxy (rtn osteoporosis monitoring)   Continue all meds Labs pending Health Maintenance reviewed-Flu and pneumonia vaccine given today Diet and exercise encouraged RTO 6 months  Evelina Dun, FNP

## 2014-07-04 NOTE — Addendum Note (Signed)
Addended by: Earlene Plater on: 07/04/2014 05:12 PM   Modules accepted: Orders

## 2014-07-04 NOTE — Patient Instructions (Signed)

## 2014-07-04 NOTE — Addendum Note (Signed)
Addended by: Rolena Infante on: 07/04/2014 12:10 PM   Modules accepted: Orders

## 2014-07-05 ENCOUNTER — Other Ambulatory Visit: Payer: Self-pay | Admitting: Family

## 2014-07-05 LAB — CMP14+EGFR
A/G RATIO: 1.4 (ref 1.1–2.5)
ALBUMIN: 4.1 g/dL (ref 3.5–5.5)
ALT: 23 IU/L (ref 0–32)
AST: 20 IU/L (ref 0–40)
Alkaline Phosphatase: 102 IU/L (ref 39–117)
BUN/Creatinine Ratio: 27 — ABNORMAL HIGH (ref 9–23)
BUN: 18 mg/dL (ref 6–24)
Bilirubin Total: 0.6 mg/dL (ref 0.0–1.2)
CO2: 25 mmol/L (ref 18–29)
Calcium: 9.7 mg/dL (ref 8.7–10.2)
Chloride: 99 mmol/L (ref 97–108)
Creatinine, Ser: 0.66 mg/dL (ref 0.57–1.00)
GFR calc Af Amer: 113 mL/min/{1.73_m2} (ref >59)
GFR calc non Af Amer: 98 mL/min/{1.73_m2} (ref >59)
GLOBULIN, TOTAL: 3 g/dL (ref 1.5–4.5)
Glucose: 117 mg/dL — ABNORMAL HIGH (ref 65–99)
Potassium: 4.4 mmol/L (ref 3.5–5.2)
Sodium: 141 mmol/L (ref 134–144)
Total Protein: 7.1 g/dL (ref 6.0–8.5)

## 2014-07-05 LAB — LIPID PANEL
CHOL/HDL RATIO: 3.9 ratio (ref 0.0–4.4)
Cholesterol, Total: 138 mg/dL (ref 100–199)
HDL: 35 mg/dL — ABNORMAL LOW (ref >39)
LDL CALC: 78 mg/dL (ref 0–99)
Triglycerides: 123 mg/dL (ref 0–149)
VLDL Cholesterol Cal: 25 mg/dL (ref 5–40)

## 2014-07-05 LAB — MICROALBUMIN, URINE: Microalbumin, Urine: <3 ug/mL (ref 0.0–17.0)

## 2014-07-05 LAB — VITAMIN D 25 HYDROXY (VIT D DEFICIENCY, FRACTURES): Vit D, 25-Hydroxy: 41.7 ng/mL (ref 30.0–100.0)

## 2014-07-05 MED ORDER — CANAGLIFLOZIN 100 MG PO TABS: 100.0000 mg | ORAL_TABLET | Freq: Every day | ORAL | Status: DC

## 2014-07-07 ENCOUNTER — Encounter: Payer: Self-pay | Admitting: Family

## 2014-07-09 ENCOUNTER — Telehealth: Payer: Self-pay | Admitting: Family

## 2014-07-09 NOTE — Telephone Encounter (Signed)
Called pt with results Verbalizes understanding Copy of labs mailed to pt

## 2014-09-05 LAB — HM DIABETES EYE EXAM

## 2014-09-22 ENCOUNTER — Encounter: Payer: Self-pay | Admitting: *Deleted

## 2014-11-24 ENCOUNTER — Encounter: Payer: Self-pay | Admitting: Family Medicine

## 2014-11-24 ENCOUNTER — Ambulatory Visit (INDEPENDENT_AMBULATORY_CARE_PROVIDER_SITE_OTHER): Payer: Managed Care, Other (non HMO) | Admitting: Family Medicine

## 2014-11-24 VITALS — BP 112/64 | HR 90 | Temp 97.0°F | Ht 64.0 in | Wt 222.0 lb

## 2014-11-24 DIAGNOSIS — J302 Other seasonal allergic rhinitis: Secondary | ICD-10-CM | POA: Diagnosis not present

## 2014-11-24 DIAGNOSIS — H6982 Other specified disorders of Eustachian tube, left ear: Secondary | ICD-10-CM

## 2014-11-24 MED ORDER — FLUTICASONE PROPIONATE 50 MCG/ACT NA SUSP: NASAL | Status: DC

## 2014-11-24 MED ORDER — AZITHROMYCIN 250 MG PO TABS: ORAL_TABLET | ORAL | Status: DC

## 2014-11-24 NOTE — Progress Notes (Signed)
Subjective:    Patient ID: Alexandria Tucker, female    DOB: 20-Nov-1955, 59 y.o.   MRN: [id number]  HPI Patient here tonight for ear pain. The left is worse than the right.        Patient Active Problem List   Diagnosis Date Noted  . Diabetes mellitus 07/04/2014  . Hyperlipidemia with target LDL less than 100 06/21/2013  . Hypertension 06/21/2013   Outpatient Encounter Prescriptions as of 11/24/2014  Medication Sig  . aspirin EC 81 MG tablet Take 81 mg by mouth daily.  Marland Kitchen atorvastatin (LIPITOR) 40 MG tablet Take 1 tablet (40 mg total) by mouth daily.  . canagliflozin (INVOKANA) 100 MG TABS tablet Take 1 tablet (100 mg total) by mouth daily.  . fexofenadine (ALLEGRA) 180 MG tablet Take 1 tablet (180 mg total) by mouth daily.  . hydrochlorothiazide (HYDRODIURIL) 25 MG tablet TAKE ONE TABLET BY MOUTH ONCE DAILY  . metFORMIN (GLUCOPHAGE) 500 MG tablet Take 1 tablet (500 mg total) by mouth 2 (two) times daily with a meal.  . ramipril (ALTACE) 10 MG capsule TAKE ONE CAPSULE BY MOUTH ONCE DAILY  . [DISCONTINUED] azithromycin (ZITHROMAX) 250 MG tablet Take 500 mg once, then 250 mg for four days  . [DISCONTINUED] benzonatate (TESSALON) 200 MG capsule Take 1 capsule (200 mg total) by mouth 3 (three) times daily as needed.  . [DISCONTINUED] etodolac (LODINE) 400 MG tablet   . [DISCONTINUED] metroNIDAZOLE (METROCREAM) 0.75 % cream Apply topically 2 (two) times daily.   No facility-administered encounter medications on file as of 11/24/2014.     Review of Systems  Constitutional: Negative.   HENT: Positive for ear pain (left is worse than right -- pain and pressure).   Eyes: Negative.   Respiratory: Negative.   Cardiovascular: Negative.   Gastrointestinal: Negative.   Endocrine: Negative.   Genitourinary: Negative.   Musculoskeletal: Negative.   Skin: Negative.   Allergic/Immunologic: Negative.   Neurological: Negative.   Hematological: Negative.   Psychiatric/Behavioral: Negative.         Objective:   Physical Exam  Constitutional: She is oriented to person, place, and time. She appears well-developed and well-nourished. No distress.  HENT:  Head: Atraumatic.  Right Ear: External ear normal.  Left Ear: External ear normal.  Mouth/Throat: Oropharynx is clear and moist. No oropharyngeal exudate.  No sinus tenderness to palpation Nasal congestion left greater than right  Eyes: Conjunctivae and EOM are normal. Pupils are equal, round, and reactive to light. Right eye exhibits no discharge. Left eye exhibits no discharge. No scleral icterus.  Neck: Normal range of motion. Neck supple. No thyromegaly present.  Slight anterior cervical tenderness on the left  Cardiovascular: Normal rate, regular rhythm and normal heart sounds.   No murmur heard. Pulmonary/Chest: Effort normal and breath sounds normal. No respiratory distress. She has no wheezes. She has no rales. She exhibits no tenderness.  Abdominal: Bowel sounds are normal.  Musculoskeletal: Normal range of motion.  Lymphadenopathy:    She has no cervical adenopathy.  Neurological: She is alert and oriented to person, place, and time.  Skin: Skin is warm and dry. No rash noted.  Psychiatric: She has a normal mood and affect. Her behavior is normal. Judgment and thought content normal.  Nursing note and vitals reviewed.  BP 112/64 mmHg  Pulse 90  Temp(Src) 97 F (36.1 C) (Oral)  Ht 5\' 4"  (1.626 m)  Wt 222 lb (100.699 kg)  BMI 38.09 kg/m2  Assessment & Plan:  1. Other seasonal allergic rhinitis -Continue with nasal saline and Allegra - fluticasone (FLONASE) 50 MCG/ACT nasal spray; One to 2 sprays each nostril at bedtime  Dispense: 16 g; Refill: 6  2. Eustachian tube dysfunction, left -Continue with nasal saline and Allegra - azithromycin (ZITHROMAX) 250 MG tablet; 2 pills the first day then one daily for infection until completed  Dispense: 6 tablet; Refill: 0  Patient Instructions  Continue  with Allegra as you have been doing Continue with nasal saline except use it in each nostril 3 or 4 times daily Start Flonase 1-2 sprays each nostril at bedtime Take antibiotic as directed   Arrie Senate MD

## 2014-11-24 NOTE — Patient Instructions (Signed)
Continue with Allegra as you have been doing Continue with nasal saline except use it in each nostril 3 or 4 times daily Start Flonase 1-2 sprays each nostril at bedtime Take antibiotic as directed

## 2014-12-03 ENCOUNTER — Encounter: Payer: Self-pay | Admitting: *Deleted

## 2014-12-31 ENCOUNTER — Other Ambulatory Visit: Payer: Self-pay | Admitting: *Deleted

## 2014-12-31 DIAGNOSIS — E785 Hyperlipidemia, unspecified: Secondary | ICD-10-CM

## 2014-12-31 DIAGNOSIS — E119 Type 2 diabetes mellitus without complications: Secondary | ICD-10-CM

## 2014-12-31 MED ORDER — METFORMIN HCL 500 MG PO TABS: 500.0000 mg | ORAL_TABLET | Freq: Two times a day (BID) | ORAL | Status: DC

## 2014-12-31 MED ORDER — ATORVASTATIN CALCIUM 40 MG PO TABS: 40.0000 mg | ORAL_TABLET | Freq: Every day | ORAL | Status: DC

## 2015-01-02 ENCOUNTER — Encounter: Payer: Self-pay | Admitting: Family

## 2015-01-02 ENCOUNTER — Ambulatory Visit (INDEPENDENT_AMBULATORY_CARE_PROVIDER_SITE_OTHER): Payer: Managed Care, Other (non HMO) | Admitting: Family

## 2015-01-02 VITALS — BP 121/74 | HR 76 | Temp 97.0°F | Ht 64.0 in | Wt 221.6 lb

## 2015-01-02 DIAGNOSIS — I10 Essential (primary) hypertension: Secondary | ICD-10-CM

## 2015-01-02 DIAGNOSIS — J309 Allergic rhinitis, unspecified: Secondary | ICD-10-CM | POA: Diagnosis not present

## 2015-01-02 DIAGNOSIS — E785 Hyperlipidemia, unspecified: Secondary | ICD-10-CM

## 2015-01-02 DIAGNOSIS — L689 Hypertrichosis, unspecified: Secondary | ICD-10-CM | POA: Diagnosis not present

## 2015-01-02 DIAGNOSIS — R5383 Other fatigue: Secondary | ICD-10-CM

## 2015-01-02 DIAGNOSIS — E119 Type 2 diabetes mellitus without complications: Secondary | ICD-10-CM | POA: Diagnosis not present

## 2015-01-02 LAB — POCT GLYCOSYLATED HEMOGLOBIN (HGB A1C): Hemoglobin A1C: 6.5

## 2015-01-02 MED ORDER — ATORVASTATIN CALCIUM 40 MG PO TABS: 40.0000 mg | ORAL_TABLET | Freq: Every day | ORAL | Status: DC

## 2015-01-02 MED ORDER — CANAGLIFLOZIN 100 MG PO TABS: 100.0000 mg | ORAL_TABLET | Freq: Every day | ORAL | Status: DC

## 2015-01-02 MED ORDER — METFORMIN HCL 500 MG PO TABS: 500.0000 mg | ORAL_TABLET | Freq: Two times a day (BID) | ORAL | Status: DC

## 2015-01-02 MED ORDER — HYDROCHLOROTHIAZIDE 25 MG PO TABS: ORAL_TABLET | ORAL | Status: DC

## 2015-01-02 MED ORDER — RAMIPRIL 10 MG PO CAPS: ORAL_CAPSULE | ORAL | Status: DC

## 2015-01-02 NOTE — Patient Instructions (Signed)

## 2015-01-02 NOTE — Progress Notes (Signed)
Subjective:    Patient ID: Alexandria Tucker, female    DOB: 1956-01-09, 59 y.o.   MRN: [id number]  Pt presents to the office today for chronic follow up.  Diabetes She presents for her follow-up diabetic visit. She has type 2 diabetes mellitus. Her disease course has been stable. Pertinent negatives for hypoglycemia include no confusion, mood changes or pallor. Pertinent negatives for diabetes include no blurred vision, no foot paresthesias, no foot ulcerations and no visual change. Pertinent negatives for hypoglycemia complications include no blackouts and no hospitalization. Symptoms are stable. Pertinent negatives for diabetic complications include no CVA, heart disease, nephropathy or peripheral neuropathy. Risk factors for coronary artery disease include diabetes mellitus, dyslipidemia, family history, hypertension, obesity and post-menopausal. Current diabetic treatment includes oral agent (monotherapy). She is compliant with treatment all of the time. (Pt does not test blood sugars ) An ACE inhibitor/angiotensin II receptor blocker is being taken. Eye exam is current (May 2016).  Hypertension This is a chronic problem. The current episode started more than 1 year ago. The problem has been resolved since onset. The problem is controlled. Pertinent negatives include no anxiety, blurred vision, malaise/fatigue, palpitations or peripheral edema. Risk factors for coronary artery disease include dyslipidemia, obesity, post-menopausal state and family history. Past treatments include ACE inhibitors and diuretics. The current treatment provides significant improvement. There is no history of kidney disease, CAD/MI, CVA, heart failure or a thyroid problem. There is no history of sleep apnea.  Hyperlipidemia This is a chronic problem. The current episode started more than 1 year ago. The problem is controlled. Recent lipid tests were reviewed and are normal. Exacerbating diseases include diabetes. She has  no history of hypothyroidism. Factors aggravating her hyperlipidemia include fatty foods. Pertinent negatives include no leg pain. Current antihyperlipidemic treatment includes statins. The current treatment provides moderate improvement of lipids. Risk factors for coronary artery disease include diabetes mellitus, dyslipidemia, family history, hypertension, obesity and a sedentary lifestyle.      Review of Systems  Constitutional: Negative.  Negative for malaise/fatigue.  HENT: Negative.   Eyes: Negative.  Negative for blurred vision.  Cardiovascular: Negative.  Negative for palpitations.  Gastrointestinal: Negative.   Endocrine: Negative.   Genitourinary: Negative.   Musculoskeletal: Negative.   Skin: Negative for pallor.  Neurological: Negative.   Hematological: Negative.   Psychiatric/Behavioral: Negative.  Negative for confusion.  All other systems reviewed and are negative.      Objective:   Physical Exam  Constitutional: She is oriented to person, place, and time. She appears well-developed and well-nourished. No distress.  HENT:  Head: Normocephalic and atraumatic.  Right Ear: External ear normal.  Mouth/Throat: Oropharynx is clear and moist.  Eyes: Pupils are equal, round, and reactive to light.  Neck: Normal range of motion. Neck supple. No thyromegaly present.  Cardiovascular: Normal rate, regular rhythm, normal heart sounds and intact distal pulses.   No murmur heard. Pulmonary/Chest: Effort normal and breath sounds normal. No respiratory distress. She has no wheezes.  Abdominal: Soft. Bowel sounds are normal. She exhibits no distension. There is no tenderness.  Musculoskeletal: Normal range of motion. She exhibits no edema or tenderness.  Neurological: She is alert and oriented to person, place, and time. She has normal reflexes. No cranial nerve deficit.  Skin: Skin is warm and dry.  Psychiatric: She has a normal mood and affect. Her behavior is normal. Judgment  and thought content normal.  Vitals reviewed.     BP 121/74 mmHg  Pulse 76  Temp(Src) 97 F (36.1 C) (Oral)  Ht '5\' 4"'  (1.626 m)  Wt 221 lb 9.6 oz (100.517 kg)  BMI 38.02 kg/m2     Assessment & Plan:  1. Hyperlipidemia with target LDL less than 100 - CMP14+EGFR - Lipid panel - atorvastatin (LIPITOR) 40 MG tablet; Take 1 tablet (40 mg total) by mouth daily.  Dispense: 90 tablet; Refill: 3  2. Type 2 diabetes mellitus without complication - POCT glycosylated hemoglobin (Hb A1C) - CMP14+EGFR - metFORMIN (GLUCOPHAGE) 500 MG tablet; Take 1 tablet (500 mg total) by mouth 2 (two) times daily with a meal.  Dispense: 180 tablet; Refill: 3 - canagliflozin (INVOKANA) 100 MG TABS tablet; Take 1 tablet (100 mg total) by mouth daily.  Dispense: 90 tablet; Refill: 4  3. Essential hypertension - CMP14+EGFR - ramipril (ALTACE) 10 MG capsule; TAKE ONE CAPSULE BY MOUTH ONCE DAILY  Dispense: 90 capsule; Refill: 3 - hydrochlorothiazide (HYDRODIURIL) 25 MG tablet; TAKE ONE TABLET BY MOUTH ONCE DAILY  Dispense: 90 tablet; Refill: 3  4. Allergic rhinitis, unspecified allergic rhinitis type - CMP14+EGFR  5. Other fatigue - CMP14+EGFR - Thyroid Panel With TSH - Vit D  25 hydroxy (rtn osteoporosis monitoring) - FSH/LH  6. Excessive hair growth - FSH/LH   Continue all meds Labs pending Health Maintenance reviewed Diet and exercise encouraged RTO 6 months   Evelina Dun, FNP

## 2015-01-03 LAB — CMP14+EGFR
A/G RATIO: 1.7 (ref 1.1–2.5)
ALT: 24 IU/L (ref 0–32)
AST: 18 IU/L (ref 0–40)
Albumin: 4.5 g/dL (ref 3.5–5.5)
Alkaline Phosphatase: 84 IU/L (ref 39–117)
BUN/Creatinine Ratio: 31 — ABNORMAL HIGH (ref 9–23)
BUN: 21 mg/dL (ref 6–24)
Bilirubin Total: 0.6 mg/dL (ref 0.0–1.2)
CALCIUM: 9.6 mg/dL (ref 8.7–10.2)
CO2: 24 mmol/L (ref 18–29)
Chloride: 98 mmol/L (ref 97–108)
Creatinine, Ser: 0.67 mg/dL (ref 0.57–1.00)
GFR, EST AFRICAN AMERICAN: 113 mL/min/{1.73_m2} (ref >59)
GFR, EST NON AFRICAN AMERICAN: 98 mL/min/{1.73_m2} (ref >59)
Globulin, Total: 2.6 g/dL (ref 1.5–4.5)
Glucose: 112 mg/dL — ABNORMAL HIGH (ref 65–99)
POTASSIUM: 4.5 mmol/L (ref 3.5–5.2)
Sodium: 139 mmol/L (ref 134–144)
Total Protein: 7.1 g/dL (ref 6.0–8.5)

## 2015-01-03 LAB — LIPID PANEL
CHOL/HDL RATIO: 4.1 ratio (ref 0.0–4.4)
Cholesterol, Total: 144 mg/dL (ref 100–199)
HDL: 35 mg/dL — AB (ref >39)
LDL CALC: 83 mg/dL (ref 0–99)
Triglycerides: 131 mg/dL (ref 0–149)
VLDL CHOLESTEROL CAL: 26 mg/dL (ref 5–40)

## 2015-01-03 LAB — FSH/LH
FSH: 60.5 m[IU]/mL
LH: 28.9 m[IU]/mL

## 2015-01-03 LAB — THYROID PANEL WITH TSH
Free Thyroxine Index: 1.9 (ref 1.2–4.9)
T3 Uptake Ratio: 22 % — ABNORMAL LOW (ref 24–39)
T4, Total: 8.6 ug/dL (ref 4.5–12.0)
TSH: 3.71 u[IU]/mL (ref 0.450–4.500)

## 2015-01-03 LAB — VITAMIN D 25 HYDROXY (VIT D DEFICIENCY, FRACTURES): Vit D, 25-Hydroxy: 43.6 ng/mL (ref 30.0–100.0)

## 2015-01-09 ENCOUNTER — Ambulatory Visit: Payer: Managed Care, Other (non HMO) | Admitting: Family

## 2015-03-16 ENCOUNTER — Telehealth: Payer: Self-pay

## 2015-03-16 NOTE — Telephone Encounter (Signed)
Invokana non preferred for her insurance   Must try Janumet Jeuntoduo XR Januvia, and Byetta

## 2015-03-17 MED ORDER — SITAGLIPTIN PHOSPHATE 100 MG PO TABS: 100.0000 mg | ORAL_TABLET | Freq: Every day | ORAL | Status: DC

## 2015-03-17 NOTE — Telephone Encounter (Signed)
Insurance denied Invokana. Januvia Prescription sent to pharmacy

## 2015-03-17 NOTE — Telephone Encounter (Signed)
x

## 2015-03-17 NOTE — Telephone Encounter (Signed)
Left detailed message stating new rx sent to pharmacy and to [facility] with any further questions or concerns.

## 2015-03-31 ENCOUNTER — Telehealth: Payer: Self-pay | Admitting: Family

## 2015-04-10 ENCOUNTER — Ambulatory Visit (INDEPENDENT_AMBULATORY_CARE_PROVIDER_SITE_OTHER): Payer: BLUE CROSS/BLUE SHIELD

## 2015-04-10 DIAGNOSIS — Z23 Encounter for immunization: Secondary | ICD-10-CM

## 2015-04-10 NOTE — Telephone Encounter (Signed)
scheduled

## 2015-05-03 HISTORY — PX: BREAST EXCISIONAL BIOPSY: SUR124

## 2015-07-03 ENCOUNTER — Encounter: Payer: Self-pay | Admitting: Family

## 2015-07-03 ENCOUNTER — Ambulatory Visit (INDEPENDENT_AMBULATORY_CARE_PROVIDER_SITE_OTHER): Payer: BLUE CROSS/BLUE SHIELD | Admitting: Family

## 2015-07-03 VITALS — BP 132/82 | HR 82 | Temp 97.0°F | Ht 64.0 in | Wt 234.0 lb

## 2015-07-03 DIAGNOSIS — Z8249 Family history of ischemic heart disease and other diseases of the circulatory system: Secondary | ICD-10-CM | POA: Diagnosis not present

## 2015-07-03 DIAGNOSIS — E785 Hyperlipidemia, unspecified: Secondary | ICD-10-CM

## 2015-07-03 DIAGNOSIS — J309 Allergic rhinitis, unspecified: Secondary | ICD-10-CM | POA: Diagnosis not present

## 2015-07-03 DIAGNOSIS — I10 Essential (primary) hypertension: Secondary | ICD-10-CM | POA: Diagnosis not present

## 2015-07-03 DIAGNOSIS — E119 Type 2 diabetes mellitus without complications: Secondary | ICD-10-CM | POA: Diagnosis not present

## 2015-07-03 LAB — POCT GLYCOSYLATED HEMOGLOBIN (HGB A1C): HEMOGLOBIN A1C: 6.6

## 2015-07-03 NOTE — Addendum Note (Signed)
Addended by: Shelbie Ammons on: 07/03/2015 08:59 AM   Modules accepted: Orders, SmartSet

## 2015-07-03 NOTE — Patient Instructions (Signed)
Health Maintenance, Female Adopting a healthy lifestyle and getting preventive care can go a long way to promote health and wellness. Talk with your health care provider about what schedule of regular examinations is right for you. This is a good chance for you to check in with your provider about disease prevention and staying healthy. In between checkups, there are plenty of things you can do on your own. Experts have done a lot of research about which lifestyle changes and preventive measures are most likely to keep you healthy. Ask your health care provider for more information. WEIGHT AND DIET  Eat a healthy diet  Be sure to include plenty of vegetables, fruits, low-fat dairy products, and lean protein.  Do not eat a lot of foods high in solid fats, added sugars, or salt.  Get regular exercise. This is one of the most important things you can do for your health.  Most adults should exercise for at least 150 minutes each week. The exercise should increase your heart rate and make you sweat (moderate-intensity exercise).  Most adults should also do strengthening exercises at least twice a week. This is in addition to the moderate-intensity exercise.  Maintain a healthy weight  Body mass index (BMI) is a measurement that can be used to identify possible weight problems. It estimates body fat based on height and weight. Your health care provider can help determine your BMI and help you achieve or maintain a healthy weight.  For females 20 years of age and older:   A BMI below 18.5 is considered underweight.  A BMI of 18.5 to 24.9 is normal.  A BMI of 25 to 29.9 is considered overweight.  A BMI of 30 and above is considered obese.  Watch levels of cholesterol and blood lipids  You should start having your blood tested for lipids and cholesterol at 58 years of age, then have this test every 5 years.  You may need to have your cholesterol levels checked more often if:  Your lipid  or cholesterol levels are high.  You are older than 58 years of age.  You are at high risk for heart disease.  CANCER SCREENING   Lung Cancer  Lung cancer screening is recommended for adults 55-80 years old who are at high risk for lung cancer because of a history of smoking.  A yearly low-dose CT scan of the lungs is recommended for people who:  Currently smoke.  Have quit within the past 15 years.  Have at least a 30-pack-year history of smoking. A pack year is smoking an average of one pack of cigarettes a day for 1 year.  Yearly screening should continue until it has been 15 years since you quit.  Yearly screening should stop if you develop a health problem that would prevent you from having lung cancer treatment.  Breast Cancer  Practice breast self-awareness. This means understanding how your breasts normally appear and feel.  It also means doing regular breast self-exams. Let your health care provider know about any changes, no matter how small.  If you are in your 20s or 30s, you should have a clinical breast exam (CBE) by a health care provider every 1-3 years as part of a regular health exam.  If you are 40 or older, have a CBE every year. Also consider having a breast X-ray (mammogram) every year.  If you have a family history of breast cancer, talk to your health care provider about genetic screening.  If you   are at high risk for breast cancer, talk to your health care provider about having an MRI and a mammogram every year.  Breast cancer gene (BRCA) assessment is recommended for women who have family members with BRCA-related cancers. BRCA-related cancers include:  Breast.  Ovarian.  Tubal.  Peritoneal cancers.  Results of the assessment will determine the need for genetic counseling and BRCA1 and BRCA2 testing. Cervical Cancer Your health care provider may recommend that you be screened regularly for cancer of the pelvic organs (ovaries, uterus, and  vagina). This screening involves a pelvic examination, including checking for microscopic changes to the surface of your cervix (Pap test). You may be encouraged to have this screening done every 3 years, beginning at age 21.  For women ages 30-65, health care providers may recommend pelvic exams and Pap testing every 3 years, or they may recommend the Pap and pelvic exam, combined with testing for human papilloma virus (HPV), every 5 years. Some types of HPV increase your risk of cervical cancer. Testing for HPV may also be done on women of any age with unclear Pap test results.  Other health care providers may not recommend any screening for nonpregnant women who are considered low risk for pelvic cancer and who do not have symptoms. Ask your health care provider if a screening pelvic exam is right for you.  If you have had past treatment for cervical cancer or a condition that could lead to cancer, you need Pap tests and screening for cancer for at least 20 years after your treatment. If Pap tests have been discontinued, your risk factors (such as having a new sexual partner) need to be reassessed to determine if screening should resume. Some women have medical problems that increase the chance of getting cervical cancer. In these cases, your health care provider may recommend more frequent screening and Pap tests. Colorectal Cancer  This type of cancer can be detected and often prevented.  Routine colorectal cancer screening usually begins at 58 years of age and continues through 58 years of age.  Your health care provider may recommend screening at an earlier age if you have risk factors for colon cancer.  Your health care provider may also recommend using home test kits to check for hidden blood in the stool.  A small camera at the end of a tube can be used to examine your colon directly (sigmoidoscopy or colonoscopy). This is done to check for the earliest forms of colorectal  cancer.  Routine screening usually begins at age 50.  Direct examination of the colon should be repeated every 5-10 years through 58 years of age. However, you may need to be screened more often if early forms of precancerous polyps or small growths are found. Skin Cancer  Check your skin from head to toe regularly.  Tell your health care provider about any new moles or changes in moles, especially if there is a change in a mole's shape or color.  Also tell your health care provider if you have a mole that is larger than the size of a pencil eraser.  Always use sunscreen. Apply sunscreen liberally and repeatedly throughout the day.  Protect yourself by wearing long sleeves, pants, a wide-brimmed hat, and sunglasses whenever you are outside. HEART DISEASE, DIABETES, AND HIGH BLOOD PRESSURE   High blood pressure causes heart disease and increases the risk of stroke. High blood pressure is more likely to develop in:  People who have blood pressure in the high end   of the normal range (130-139/85-89 mm Hg).  People who are overweight or obese.  People who are African American.  If you are 38-23 years of age, have your blood pressure checked every 3-5 years. If you are 61 years of age or older, have your blood pressure checked every year. You should have your blood pressure measured twice--once when you are at a hospital or clinic, and once when you are not at a hospital or clinic. Record the average of the two measurements. To check your blood pressure when you are not at a hospital or clinic, you can use:  An automated blood pressure machine at a pharmacy.  A home blood pressure monitor.  If you are between 45 years and 39 years old, ask your health care provider if you should take aspirin to prevent strokes.  Have regular diabetes screenings. This involves taking a blood sample to check your fasting blood sugar level.  If you are at a normal weight and have a low risk for diabetes,  have this test once every three years after 58 years of age.  If you are overweight and have a high risk for diabetes, consider being tested at a younger age or more often. PREVENTING INFECTION  Hepatitis B  If you have a higher risk for hepatitis B, you should be screened for this virus. You are considered at high risk for hepatitis B if:  You were born in a country where hepatitis B is common. Ask your health care provider which countries are considered high risk.  Your parents were born in a high-risk country, and you have not been immunized against hepatitis B (hepatitis B vaccine).  You have HIV or AIDS.  You use needles to inject street drugs.  You live with someone who has hepatitis B.  You have had sex with someone who has hepatitis B.  You get hemodialysis treatment.  You take certain medicines for conditions, including cancer, organ transplantation, and autoimmune conditions. Hepatitis C  Blood testing is recommended for:  Everyone born from [birth year] through 1963.  Anyone with known risk factors for hepatitis C. Sexually transmitted infections (STIs)  You should be screened for sexually transmitted infections (STIs) including gonorrhea and chlamydia if:  You are sexually active and are younger than 58 years of age.  You are older than 58 years of age and your health care provider tells you that you are at risk for this type of infection.  Your sexual activity has changed since you were last screened and you are at an increased risk for chlamydia or gonorrhea. Ask your health care provider if you are at risk.  If you do not have HIV, but are at risk, it may be recommended that you take a prescription medicine daily to prevent HIV infection. This is called pre-exposure prophylaxis (PrEP). You are considered at risk if:  You are sexually active and do not regularly use condoms or know the HIV status of your partner(s).  You take drugs by injection.  You are sexually  active with a partner who has HIV. Talk with your health care provider about whether you are at high risk of being infected with HIV. If you choose to begin PrEP, you should first be tested for HIV. You should then be tested every 3 months for as long as you are taking PrEP.  PREGNANCY   If you are premenopausal and you may become pregnant, ask your health care provider about preconception counseling.  If you may  become pregnant, take 400 to 800 micrograms (mcg) of folic acid every day.  If you want to prevent pregnancy, talk to your health care provider about birth control (contraception). OSTEOPOROSIS AND MENOPAUSE   Osteoporosis is a disease in which the bones lose minerals and strength with aging. This can result in serious bone fractures. Your risk for osteoporosis can be identified using a bone density scan.  If you are 61 years of age or older, or if you are at risk for osteoporosis and fractures, ask your health care provider if you should be screened.  Ask your health care provider whether you should take a calcium or vitamin D supplement to lower your risk for osteoporosis.  Menopause may have certain physical symptoms and risks.  Hormone replacement therapy may reduce some of these symptoms and risks. Talk to your health care provider about whether hormone replacement therapy is right for you.  HOME CARE INSTRUCTIONS   Schedule regular health, dental, and eye exams.  Stay current with your immunizations.   Do not use any tobacco products including cigarettes, chewing tobacco, or electronic cigarettes.  If you are pregnant, do not drink alcohol.  If you are breastfeeding, limit how much and how often you drink alcohol.  Limit alcohol intake to no more than 1 drink per day for nonpregnant women. One drink equals 12 ounces of beer, 5 ounces of wine, or 1 ounces of hard liquor.  Do not use street drugs.  Do not share needles.  Ask your health care provider for help if  you need support or information about quitting drugs.  Tell your health care provider if you often feel depressed.  Tell your health care provider if you have ever been abused or do not feel safe at home.   This information is not intended to replace advice given to you by your health care provider. Make sure you discuss any questions you have with your health care provider.   Document Released: 11/01/2010 Document Revised: 05/09/2014 Document Reviewed: 03/20/2013 Elsevier Interactive Patient Education [employer].

## 2015-07-03 NOTE — Progress Notes (Signed)
Subjective:    Patient ID: Alexandria Tucker, female    DOB: 09-15-1955, 60 y.o.   MRN: [id number]  Pt presents to the office today for chronic follow up.  Diabetes She presents for her follow-up diabetic visit. She has type 2 diabetes mellitus. Her disease course has been stable. Pertinent negatives for hypoglycemia include no confusion, mood changes or pallor. Pertinent negatives for diabetes include no blurred vision, no foot paresthesias, no foot ulcerations and no visual change. Pertinent negatives for hypoglycemia complications include no blackouts and no hospitalization. Symptoms are stable. Pertinent negatives for diabetic complications include no CVA, heart disease, nephropathy or peripheral neuropathy. Risk factors for coronary artery disease include diabetes mellitus, dyslipidemia, family history, hypertension, obesity and post-menopausal. Current diabetic treatment includes oral agent (monotherapy). She is compliant with treatment all of the time. (Pt does not test blood sugars ) An ACE inhibitor/angiotensin II receptor blocker is being taken. Eye exam is current (May 2016).  Hypertension This is a chronic problem. The current episode started more than 1 year ago. The problem has been resolved since onset. The problem is controlled. Pertinent negatives include no anxiety, blurred vision, malaise/fatigue, palpitations or peripheral edema. Risk factors for coronary artery disease include dyslipidemia, obesity, post-menopausal state and family history. Past treatments include ACE inhibitors and diuretics. The current treatment provides significant improvement. There is no history of kidney disease, CAD/MI, CVA, heart failure or a thyroid problem. There is no history of sleep apnea.  Hyperlipidemia This is a chronic problem. The current episode started more than 1 year ago. The problem is controlled. Recent lipid tests were reviewed and are normal. Exacerbating diseases include diabetes. She has  no history of hypothyroidism. Factors aggravating her hyperlipidemia include fatty foods. Pertinent negatives include no leg pain. Current antihyperlipidemic treatment includes statins. The current treatment provides moderate improvement of lipids. Risk factors for coronary artery disease include diabetes mellitus, dyslipidemia, family history, hypertension, obesity and a sedentary lifestyle.      Review of Systems  Constitutional: Negative.  Negative for malaise/fatigue.  HENT: Negative.   Eyes: Negative.  Negative for blurred vision.  Cardiovascular: Negative.  Negative for palpitations.  Gastrointestinal: Negative.   Endocrine: Negative.   Genitourinary: Negative.   Musculoskeletal: Negative.   Skin: Negative for pallor.  Neurological: Negative.   Hematological: Negative.   Psychiatric/Behavioral: Negative.  Negative for confusion.  All other systems reviewed and are negative.      Objective:   Physical Exam  Constitutional: She is oriented to person, place, and time. She appears well-developed and well-nourished. No distress.  HENT:  Head: Normocephalic and atraumatic.  Right Ear: External ear normal.  Left Ear: External ear normal.  Nose: Nose normal.  Mouth/Throat: Oropharynx is clear and moist.  Eyes: Pupils are equal, round, and reactive to light.  Neck: Normal range of motion. Neck supple. No thyromegaly present.  Cardiovascular: Normal rate, regular rhythm, normal heart sounds and intact distal pulses.   No murmur heard. Pulmonary/Chest: Effort normal and breath sounds normal. No respiratory distress. She has no wheezes.  Abdominal: Soft. Bowel sounds are normal. She exhibits no distension. There is no tenderness.  Musculoskeletal: Normal range of motion. She exhibits no edema or tenderness.  Neurological: She is alert and oriented to person, place, and time. She has normal reflexes. No cranial nerve deficit.  Skin: Skin is warm and dry.  Psychiatric: She has a  normal mood and affect. Her behavior is normal. Judgment and thought content normal.  Vitals reviewed.  EKG-   BP 132/82 mmHg  Pulse 82  Temp(Src) 97 F (36.1 C) (Oral)  Ht '5\' 4"'  (1.626 m)  Wt 234 lb (106.142 kg)  BMI 40.15 kg/m2     Assessment & Plan:  1. Essential hypertension - CMP14+EGFR - EKG 12-Lead - Ambulatory referral to Cardiology  2. Allergic rhinitis, unspecified allergic rhinitis type - CMP14+EGFR  3. Type 2 diabetes mellitus without complication, without long-term current use of insulin (HCC) - CMP14+EGFR - POCT glycosylated hemoglobin (Hb A1C) - EKG 12-Lead - Ambulatory referral to Cardiology  4. Hyperlipidemia with target LDL less than 100 - CMP14+EGFR - Lipid panel - EKG 12-Lead - Ambulatory referral to Cardiology  5. Family history of heart disease - Ambulatory referral to Cardiology   Continue all meds Labs pending Health Maintenance reviewed Diet and exercise encouraged RTO 6 months  Evelina Dun, FNP

## 2015-07-04 LAB — CMP14+EGFR
ALT: 41 IU/L — AB (ref 0–32)
AST: 28 IU/L (ref 0–40)
Albumin/Globulin Ratio: 1.7 (ref 1.1–2.5)
Albumin: 4.5 g/dL (ref 3.5–5.5)
Alkaline Phosphatase: 79 IU/L (ref 39–117)
BILIRUBIN TOTAL: 0.5 mg/dL (ref 0.0–1.2)
BUN/Creatinine Ratio: 24 — ABNORMAL HIGH (ref 9–23)
BUN: 17 mg/dL (ref 6–24)
CALCIUM: 9.6 mg/dL (ref 8.7–10.2)
CHLORIDE: 98 mmol/L (ref 96–106)
CO2: 21 mmol/L (ref 18–29)
Creatinine, Ser: 0.71 mg/dL (ref 0.57–1.00)
GFR calc non Af Amer: 94 mL/min/{1.73_m2} (ref >59)
GFR, EST AFRICAN AMERICAN: 109 mL/min/{1.73_m2} (ref >59)
Globulin, Total: 2.6 g/dL (ref 1.5–4.5)
Glucose: 124 mg/dL — ABNORMAL HIGH (ref 65–99)
Potassium: 4.3 mmol/L (ref 3.5–5.2)
Sodium: 141 mmol/L (ref 134–144)
TOTAL PROTEIN: 7.1 g/dL (ref 6.0–8.5)

## 2015-07-04 LAB — LIPID PANEL
Chol/HDL Ratio: 6.8 ratio units — ABNORMAL HIGH (ref 0.0–4.4)
Cholesterol, Total: 216 mg/dL — ABNORMAL HIGH (ref 100–199)
HDL: 32 mg/dL — AB (ref >39)
LDL Calculated: 146 mg/dL — ABNORMAL HIGH (ref 0–99)
TRIGLYCERIDES: 188 mg/dL — AB (ref 0–149)
VLDL CHOLESTEROL CAL: 38 mg/dL (ref 5–40)

## 2015-07-06 ENCOUNTER — Telehealth: Payer: Self-pay | Admitting: Family

## 2015-07-06 NOTE — Telephone Encounter (Signed)
lmtcb

## 2015-07-07 MED ORDER — SIMVASTATIN 20 MG PO TABS: 20.0000 mg | ORAL_TABLET | Freq: Every day | ORAL | Status: DC

## 2015-07-07 NOTE — Telephone Encounter (Signed)
Patient had not taken any cholesterol medications since December.  The lipitor had caused leg pain and weakness so she stopped taking it.   With the new lab results, are you going to put her on another cholesterol medication?   Willing to try crestor or other suggestions unless cost or side effects are an issue. Please let her know if a new script is sent to pharmacy.

## 2015-07-07 NOTE — Telephone Encounter (Signed)
Patient informed that Zocor was sent to [facility] and advised her to contact us if she develops any myalgias.  We also mailed patient a [place] coupon she requested for her husband.

## 2015-07-07 NOTE — Telephone Encounter (Signed)
-----   Message from Sharion Balloon, [place] sent at 07/06/2015  9:33 AM EST ----- HgbA1C WNL Kidney and liver function stable Cholesterol levels elevated- PT needs to be on low fat diet and exercise, will increase medication if elevated on next lab work

## 2015-07-07 NOTE — Telephone Encounter (Signed)
Zocor rx sent to pharmacy. PT needs to be on low fat diet and exercise. Pt to call if she develops muscle aches or side effects.

## 2015-08-07 ENCOUNTER — Ambulatory Visit (INDEPENDENT_AMBULATORY_CARE_PROVIDER_SITE_OTHER): Payer: BLUE CROSS/BLUE SHIELD | Admitting: Cardiology

## 2015-08-07 ENCOUNTER — Encounter: Payer: Self-pay | Admitting: Cardiology

## 2015-08-07 VITALS — BP 128/78 | HR 84 | Ht 64.0 in | Wt 236.0 lb

## 2015-08-07 DIAGNOSIS — I10 Essential (primary) hypertension: Secondary | ICD-10-CM

## 2015-08-07 DIAGNOSIS — R072 Precordial pain: Secondary | ICD-10-CM | POA: Diagnosis not present

## 2015-08-07 DIAGNOSIS — E785 Hyperlipidemia, unspecified: Secondary | ICD-10-CM | POA: Diagnosis not present

## 2015-08-07 NOTE — Patient Instructions (Addendum)
Your physician recommends that you schedule a follow-up appointment in: 1 month   Your physician has requested that you have an exercise tolerance test. For further information please visit [email or web address]. Please also follow instruction sheet, as given.  Hold Januvia and metformin the morning of test  Your physician recommends that you continue on your current medications as directed. Please refer to the Current Medication list given to you today.   Thank you for choosing [facility] !

## 2015-08-07 NOTE — Progress Notes (Signed)
Patient ID: Alexandria Tucker, female   DOB: [birth year], 58 y.o.   MRN: [id number]     Clinical Summary Alexandria Tucker is a 60 y.o.female seen today as a new patient, she is referred by NP Evelina Dun  60 y.o.  1. Chest pain - off and on for 1 year. Can occur at rest or with exertion. Occurs left chest, tingling left arm. 8-9/10 in severity. No other associated symptoms. Lasts for 5-10 minutes. Occurs 1-2 times a month. Notes some increase frequency, no change or severity - can have some DOE which is new. - can get a different pain while bending over, left breast. Better with sitting up.   CAD risk factors: DM2, HTN, hyperlipidemia, smokeing 8 years and quit 32 years ago. Father MI 73, brother has a defibrillator, one brother with 4 stents age 32.  2. HTN - compliant with meds  3. Hyperlipidemia - lipitor caused leg pains. Recently changed go zocor.      Past Medical History  Diagnosis Date  . Hypertension   . Hyperlipidemia      Allergies  Allergen Reactions  . Augmentin [Amoxicillin-Pot Clavulanate]     Thrush  . Niaspan [Niacin Er]     Burning, itching skin.     Current Outpatient Prescriptions  Medication Sig Dispense Refill  . aspirin EC 81 MG tablet Take 81 mg by mouth daily.    . fexofenadine (ALLEGRA) 180 MG tablet Take 1 tablet (180 mg total) by mouth daily. 90 tablet 4  . fluticasone (FLONASE) 50 MCG/ACT nasal spray One to 2 sprays each nostril at bedtime 16 g 6  . hydrochlorothiazide (HYDRODIURIL) 25 MG tablet TAKE ONE TABLET BY MOUTH ONCE DAILY 90 tablet 3  . metFORMIN (GLUCOPHAGE) 500 MG tablet Take 1 tablet (500 mg total) by mouth 2 (two) times daily with a meal. 180 tablet 3  . ramipril (ALTACE) 10 MG capsule TAKE ONE CAPSULE BY MOUTH ONCE DAILY 90 capsule 3  . simvastatin (ZOCOR) 20 MG tablet Take 1 tablet (20 mg total) by mouth at bedtime. 90 tablet 3  . sitaGLIPtin (JANUVIA) 100 MG tablet Take 1 tablet (100 mg total) by mouth daily. 90 tablet 1   No current  facility-administered medications for this visit.     Past Surgical History  Procedure Laterality Date  . Kidney stones    . Cesarean section    . Tubal ligation    . Abdominal hysterectomy       Allergies  Allergen Reactions  . Augmentin [Amoxicillin-Pot Clavulanate]     Thrush  . Niaspan [Niacin Er]     Burning, itching skin.      Family History  Problem Relation Age of Onset  . Hypertension Mother   . Heart disease Father   . Heart disease Brother   . Hypertension Brother   . Hypertension Brother      Social History Ms. Milkovich reports that she has never smoked. She does not have any smokeless tobacco history on file. Ms. Schurman reports that she does not drink alcohol.   Review of Systems CONSTITUTIONAL: No weight loss, fever, chills, weakness or fatigue.  HEENT: Eyes: No visual loss, blurred vision, double vision or yellow sclerae.No hearing loss, sneezing, congestion, runny nose or sore throat.  SKIN: No rash or itching.  CARDIOVASCULAR: per HPI RESPIRATORY: No shortness of breath, cough or sputum.  GASTROINTESTINAL: No anorexia, nausea, vomiting or diarrhea. No abdominal pain or blood.  GENITOURINARY: No burning on urination, no polyuria NEUROLOGICAL: No headache, dizziness,  syncope, paralysis, ataxia, numbness or tingling in the extremities. No change in bowel or bladder control.  MUSCULOSKELETAL: No muscle, back pain, joint pain or stiffness.  LYMPHATICS: No enlarged nodes. No history of splenectomy.  PSYCHIATRIC: No history of depression or anxiety.  ENDOCRINOLOGIC: No reports of sweating, cold or heat intolerance. No polyuria or polydipsia.  Marland Kitchen   Physical Examination Filed Vitals:   08/07/15 0914  BP: 128/78  Pulse: 84   Filed Vitals:   08/07/15 0914  Height: 5\' 4"  (1.626 m)  Weight: 236 lb (107.049 kg)    Gen: resting comfortably, no acute distress HEENT: no scleral icterus, pupils equal round and reactive, no palptable cervical  adenopathy,  CV: RRR, no m/r/g, no jvd Resp: Clear to auscultation bilaterally GI: abdomen is soft, non-tender, non-distended, normal bowel sounds, no hepatosplenomegaly MSK: extremities are warm, no edema.  Skin: warm, no rash Neuro:  no focal deficits Psych: appropriate affect      Assessment and Plan  1. Chest pain - multiple CAD risk factors. Baseline EKG shows NSR, no ischemic changes  - we will plan for a GXT to better evaluate   2. HTN - at goal, continue current meds  3. Hyperlipidemia - muscle aches on lipitor, seems to be tolerating simvastatin 20mg  daily. Continue current statin        Arnoldo Lenis, M.D.

## 2015-08-28 ENCOUNTER — Ambulatory Visit (HOSPITAL_COMMUNITY)
Admission: RE | Admit: 2015-08-28 | Discharge: 2015-08-28 | Disposition: A | Discharge status: Home / Self Care | Payer: BLUE CROSS/BLUE SHIELD | Source: Ambulatory Visit | Attending: Cardiology | Admitting: Cardiology

## 2015-08-28 DIAGNOSIS — Z6839 Body mass index (BMI) 39.0-39.9, adult: Secondary | ICD-10-CM | POA: Diagnosis not present

## 2015-08-28 DIAGNOSIS — R9439 Abnormal result of other cardiovascular function study: Secondary | ICD-10-CM | POA: Insufficient documentation

## 2015-08-28 DIAGNOSIS — R072 Precordial pain: Secondary | ICD-10-CM

## 2015-08-28 DIAGNOSIS — Z01419 Encounter for gynecological examination (general) (routine) without abnormal findings: Secondary | ICD-10-CM | POA: Diagnosis not present

## 2015-08-28 DIAGNOSIS — Z1231 Encounter for screening mammogram for malignant neoplasm of breast: Secondary | ICD-10-CM | POA: Diagnosis not present

## 2015-08-28 DIAGNOSIS — R32 Unspecified urinary incontinence: Secondary | ICD-10-CM | POA: Diagnosis not present

## 2015-08-28 LAB — EXERCISE TOLERANCE TEST
CSEPED: 6 min
CSEPEDS: 24 s
CSEPPHR: 151 {beats}/min
Estimated workload: 8.2 METS
MPHR: 162 {beats}/min
Percent HR: 93 %
RPE: 13
Rest HR: 77 {beats}/min

## 2015-08-31 ENCOUNTER — Telehealth: Payer: Self-pay

## 2015-08-31 DIAGNOSIS — R072 Precordial pain: Secondary | ICD-10-CM

## 2015-08-31 NOTE — Telephone Encounter (Signed)
Pt made aware,lexiscan ordered,pt aware to Hold all diabetic meds the am of test

## 2015-08-31 NOTE — Telephone Encounter (Signed)
-----   Message from Arnoldo Lenis, MD sent at 08/31/2015 11:37 AM EDT ----- Stress test showed mixed findings, does not conclusively show the presence or absence of significant coronary artery disease. Would like to do a follow up test to get more detailed information, please order a lexiscan, does not need to hold any meds prior  Zandra Abts MD

## 2015-09-04 ENCOUNTER — Other Ambulatory Visit: Payer: Self-pay | Admitting: Obstetrics

## 2015-09-04 DIAGNOSIS — R928 Other abnormal and inconclusive findings on diagnostic imaging of breast: Secondary | ICD-10-CM

## 2015-09-11 ENCOUNTER — Encounter (HOSPITAL_COMMUNITY)
Admission: RE | Admit: 2015-09-11 | Discharge: 2015-09-11 | Disposition: A | Discharge status: Home / Self Care | Payer: BLUE CROSS/BLUE SHIELD | Source: Ambulatory Visit | Attending: Cardiology | Admitting: Cardiology

## 2015-09-11 ENCOUNTER — Ambulatory Visit
Admission: RE | Admit: 2015-09-11 | Discharge: 2015-09-11 | Disposition: A | Discharge status: Home / Self Care | Payer: BLUE CROSS/BLUE SHIELD | Source: Ambulatory Visit | Attending: Obstetrics | Admitting: Obstetrics

## 2015-09-11 ENCOUNTER — Inpatient Hospital Stay (HOSPITAL_COMMUNITY): Admission: RE | Admit: 2015-09-11 | Payer: BLUE CROSS/BLUE SHIELD | Source: Ambulatory Visit

## 2015-09-11 ENCOUNTER — Other Ambulatory Visit: Payer: Self-pay | Admitting: Obstetrics

## 2015-09-11 ENCOUNTER — Encounter (HOSPITAL_COMMUNITY): Payer: Self-pay

## 2015-09-11 DIAGNOSIS — R072 Precordial pain: Secondary | ICD-10-CM | POA: Diagnosis not present

## 2015-09-11 DIAGNOSIS — R928 Other abnormal and inconclusive findings on diagnostic imaging of breast: Secondary | ICD-10-CM

## 2015-09-11 DIAGNOSIS — I5189 Other ill-defined heart diseases: Secondary | ICD-10-CM | POA: Diagnosis not present

## 2015-09-11 DIAGNOSIS — N63 Unspecified lump in breast: Secondary | ICD-10-CM | POA: Diagnosis not present

## 2015-09-11 HISTORY — DX: Type 2 diabetes mellitus without complications: E11.9

## 2015-09-11 LAB — NM MYOCAR MULTI W/SPECT W/WALL MOTION / EF
CHL CUP NUCLEAR SDS: 3
CHL CUP NUCLEAR SRS: 0
CHL CUP NUCLEAR SSS: 3
CHL CUP RESTING HR STRESS: 76 {beats}/min
LV sys vol: 35 mL
LVDIAVOL: 62 mL (ref 46–106)
Peak HR: 116 {beats}/min
RATE: 0.41
TID: 1.07

## 2015-09-11 MED ORDER — REGADENOSON 0.4 MG/5ML IV SOLN
INTRAVENOUS | Status: AC
  Administered 2015-09-11: 0.4 mg via INTRAVENOUS
  Filled 2015-09-11: qty 5

## 2015-09-11 MED ORDER — TECHNETIUM TC 99M SESTAMIBI - CARDIOLITE
10.0000 | Freq: Once | INTRAVENOUS | Status: AC | PRN
  Administered 2015-09-11: 08:00:00 10.4 via INTRAVENOUS

## 2015-09-11 MED ORDER — SODIUM CHLORIDE 0.9% FLUSH
INTRAVENOUS | Status: AC
  Administered 2015-09-11: 10 mL via INTRAVENOUS
  Filled 2015-09-11: qty 10

## 2015-09-11 MED ORDER — TECHNETIUM TC 99M SESTAMIBI GENERIC - CARDIOLITE
30.0000 | Freq: Once | INTRAVENOUS | Status: AC | PRN
  Administered 2015-09-11: 31.5 via INTRAVENOUS

## 2015-09-15 ENCOUNTER — Telehealth: Payer: Self-pay | Admitting: *Deleted

## 2015-09-15 DIAGNOSIS — I10 Essential (primary) hypertension: Secondary | ICD-10-CM

## 2015-09-15 NOTE — Telephone Encounter (Signed)
Called patient with test results. No answer. Left message to call back.  

## 2015-09-15 NOTE — Telephone Encounter (Signed)
-----   Message from Arnoldo Lenis, MD sent at 09/14/2015  3:40 PM EDT ----- Stress test shows no evidence of any artery blockages. The test does suggest that the pumping function of her heart may be a little bit decrease. While this test is only average at measuring heart pumping function, we should get an echo to better evaluate to verify that her heart pumping function is normal.   Zandra Abts MD

## 2015-09-18 ENCOUNTER — Ambulatory Visit (HOSPITAL_COMMUNITY)
Admission: RE | Admit: 2015-09-18 | Discharge: 2015-09-18 | Disposition: A | Discharge status: Home / Self Care | Payer: BLUE CROSS/BLUE SHIELD | Source: Ambulatory Visit | Attending: Cardiology | Admitting: Cardiology

## 2015-09-18 DIAGNOSIS — E119 Type 2 diabetes mellitus without complications: Secondary | ICD-10-CM | POA: Insufficient documentation

## 2015-09-18 DIAGNOSIS — I10 Essential (primary) hypertension: Secondary | ICD-10-CM | POA: Diagnosis not present

## 2015-09-18 DIAGNOSIS — E785 Hyperlipidemia, unspecified: Secondary | ICD-10-CM | POA: Diagnosis not present

## 2015-09-26 ENCOUNTER — Other Ambulatory Visit: Payer: Self-pay | Admitting: Family

## 2015-10-03 ENCOUNTER — Encounter: Payer: Self-pay | Admitting: Family

## 2015-10-03 ENCOUNTER — Ambulatory Visit (INDEPENDENT_AMBULATORY_CARE_PROVIDER_SITE_OTHER): Payer: BLUE CROSS/BLUE SHIELD | Admitting: Family

## 2015-10-03 VITALS — BP 125/72 | HR 81 | Temp 96.8°F | Ht 64.0 in | Wt 236.4 lb

## 2015-10-03 DIAGNOSIS — H6122 Impacted cerumen, left ear: Secondary | ICD-10-CM

## 2015-10-03 NOTE — Patient Instructions (Signed)
Cerumen Impaction The structures of the external ear canal secrete a waxy substance known as cerumen. Excess cerumen can build up in the ear canal, causing a condition known as cerumen impaction. Cerumen impaction can cause ear pain and disrupt the function of the ear. The rate of cerumen production differs for each individual. In certain individuals, the configuration of the ear canal may decrease his or her ability to naturally remove cerumen. CAUSES Cerumen impaction is caused by excessive cerumen production or buildup. RISK FACTORS  Frequent use of swabs to clean ears.  Having narrow ear canals.  Having eczema.  Being dehydrated. SIGNS AND SYMPTOMS  Diminished hearing.  Ear drainage.  Ear pain.  Ear itch. TREATMENT Treatment may involve:  Over-the-counter or prescription ear drops to soften the cerumen.  Removal of cerumen by a health care provider. This may be done with:  Irrigation with warm water. This is the most common method of removal.  Ear curettes and other instruments.  Surgery. This may be done in severe cases. HOME CARE INSTRUCTIONS  Take medicines only as directed by your health care provider.  Do not insert objects into the ear with the intent of cleaning the ear. PREVENTION  Do not insert objects into the ear, even with the intent of cleaning the ear. Removing cerumen as a part of normal hygiene is not necessary, and the use of swabs in the ear canal is not recommended.  Drink enough water to keep your urine clear or pale yellow.  Control your eczema if you have it. SEEK MEDICAL CARE IF:  You develop ear pain.  You develop bleeding from the ear.  The cerumen does not clear after you use ear drops as directed.   This information is not intended to replace advice given to you by your health care provider. Make sure you discuss any questions you have with your health care provider.   Document Released: 05/26/2004 Document Revised: 05/09/2014  Document Reviewed: 12/03/2014 Elsevier Interactive Patient Education 2016 [employer]  

## 2015-10-03 NOTE — Progress Notes (Signed)
   Subjective:    Patient ID: Alexandria Tucker, female    DOB: 05-May-1955, 60 y.o.   MRN: [id number]  Ear Fullness  There is pain in both ears. This is a new problem. The current episode started in the past 7 days. The problem occurs constantly. The problem has been unchanged. There has been no fever. The pain is at a severity of 4/10. The pain is mild. Associated symptoms include hearing loss. Pertinent negatives include no coughing, ear discharge, headaches or sore throat. She has tried ear drops for the symptoms. The treatment provided mild relief.  Sinus Problem Pertinent negatives include no coughing, headaches, shortness of breath or sore throat.      Review of Systems  Constitutional: Negative.   HENT: Positive for hearing loss. Negative for ear discharge and sore throat.   Eyes: Negative.   Respiratory: Negative.  Negative for cough and shortness of breath.   Cardiovascular: Negative.  Negative for palpitations.  Gastrointestinal: Negative.   Endocrine: Negative.   Genitourinary: Negative.   Musculoskeletal: Negative.   Neurological: Negative.  Negative for headaches.  Hematological: Negative.   Psychiatric/Behavioral: Negative.   All other systems reviewed and are negative.      Objective:   Physical Exam  Constitutional: She is oriented to person, place, and time. She appears well-developed and well-nourished. No distress.  HENT:  Head: Normocephalic and atraumatic.  Right Ear: External ear normal.  Mouth/Throat: Oropharynx is clear and moist.  Left ear mild cerumen impaction    Eyes: Pupils are equal, round, and reactive to light.  Neck: Normal range of motion. Neck supple. No thyromegaly present.  Cardiovascular: Normal rate, regular rhythm, normal heart sounds and intact distal pulses.   No murmur heard. Pulmonary/Chest: Effort normal and breath sounds normal. No respiratory distress. She has no wheezes.  Abdominal: Soft. Bowel sounds are normal. She exhibits no  distension. There is no tenderness.  Musculoskeletal: Normal range of motion. She exhibits no edema or tenderness.  Neurological: She is alert and oriented to person, place, and time.  Skin: Skin is warm and dry.  Psychiatric: She has a normal mood and affect. Her behavior is normal. Judgment and thought content normal.  Vitals reviewed.   Left ear cleaned with peroxide and water, TM WNL  BP 125/72 mmHg  Pulse 81  Temp(Src) 96.8 F (36 C) (Oral)  Ht 5\' 4"  (1.626 m)  Wt 236 lb 6.4 oz (107.23 kg)  BMI 40.56 kg/m2     Assessment & Plan:  1. Cerumen impaction, left -Keep ears clean and dry -Continue Flonase daily -Wax drops OTC -RTO Prn   Evelina Dun, FNP

## 2015-10-07 ENCOUNTER — Telehealth: Payer: Self-pay | Admitting: Family

## 2015-10-07 MED ORDER — AZITHROMYCIN 250 MG PO TABS: ORAL_TABLET | ORAL | Status: DC

## 2015-10-07 NOTE — Telephone Encounter (Signed)
Prescription sent to pharmacy.

## 2015-10-07 NOTE — Telephone Encounter (Signed)
Detailed message left that rx has been sent to pharmacy.  

## 2015-10-16 ENCOUNTER — Ambulatory Visit: Payer: BLUE CROSS/BLUE SHIELD | Admitting: Cardiology

## 2015-10-30 ENCOUNTER — Ambulatory Visit: Payer: BLUE CROSS/BLUE SHIELD | Admitting: Cardiology

## 2015-11-28 ENCOUNTER — Other Ambulatory Visit: Payer: Self-pay | Admitting: Obstetrics

## 2015-11-28 DIAGNOSIS — N6452 Nipple discharge: Secondary | ICD-10-CM

## 2015-12-04 ENCOUNTER — Ambulatory Visit
Admission: RE | Admit: 2015-12-04 | Discharge: 2015-12-04 | Disposition: A | Discharge status: Home / Self Care | Payer: Commercial Managed Care - PPO | Source: Ambulatory Visit | Attending: Obstetrics | Admitting: Obstetrics

## 2015-12-04 ENCOUNTER — Other Ambulatory Visit: Payer: Self-pay | Admitting: Obstetrics

## 2015-12-04 DIAGNOSIS — N6452 Nipple discharge: Secondary | ICD-10-CM

## 2015-12-04 DIAGNOSIS — N63 Unspecified lump in unspecified breast: Secondary | ICD-10-CM

## 2015-12-11 ENCOUNTER — Other Ambulatory Visit: Payer: Commercial Managed Care - PPO

## 2015-12-18 ENCOUNTER — Ambulatory Visit
Admission: RE | Admit: 2015-12-18 | Discharge: 2015-12-18 | Disposition: A | Discharge status: Home / Self Care | Payer: Commercial Managed Care - PPO | Source: Ambulatory Visit | Attending: Obstetrics | Admitting: Obstetrics

## 2015-12-18 ENCOUNTER — Other Ambulatory Visit: Payer: Self-pay | Admitting: Obstetrics

## 2015-12-18 ENCOUNTER — Other Ambulatory Visit: Payer: Commercial Managed Care - PPO

## 2015-12-18 DIAGNOSIS — N63 Unspecified lump in unspecified breast: Secondary | ICD-10-CM

## 2015-12-24 ENCOUNTER — Other Ambulatory Visit: Payer: Self-pay | Admitting: Family

## 2016-01-01 ENCOUNTER — Other Ambulatory Visit: Payer: Self-pay | Admitting: Family

## 2016-01-01 NOTE — Telephone Encounter (Signed)
Patient aware that we do not have samples.

## 2016-01-03 ENCOUNTER — Other Ambulatory Visit: Payer: Self-pay | Admitting: Family

## 2016-01-03 DIAGNOSIS — E119 Type 2 diabetes mellitus without complications: Secondary | ICD-10-CM

## 2016-01-05 NOTE — Telephone Encounter (Signed)
Last refill without being seen 

## 2016-01-22 ENCOUNTER — Ambulatory Visit (INDEPENDENT_AMBULATORY_CARE_PROVIDER_SITE_OTHER): Payer: Commercial Managed Care - PPO | Admitting: Family

## 2016-01-22 ENCOUNTER — Encounter: Payer: Self-pay | Admitting: Family

## 2016-01-22 VITALS — BP 124/78 | HR 83 | Temp 97.0°F | Ht 64.0 in | Wt 239.6 lb

## 2016-01-22 DIAGNOSIS — E669 Obesity, unspecified: Secondary | ICD-10-CM | POA: Insufficient documentation

## 2016-01-22 DIAGNOSIS — E785 Hyperlipidemia, unspecified: Secondary | ICD-10-CM | POA: Diagnosis not present

## 2016-01-22 DIAGNOSIS — M2550 Pain in unspecified joint: Secondary | ICD-10-CM

## 2016-01-22 DIAGNOSIS — J309 Allergic rhinitis, unspecified: Secondary | ICD-10-CM | POA: Diagnosis not present

## 2016-01-22 DIAGNOSIS — E663 Overweight: Secondary | ICD-10-CM | POA: Insufficient documentation

## 2016-01-22 DIAGNOSIS — I10 Essential (primary) hypertension: Secondary | ICD-10-CM | POA: Diagnosis not present

## 2016-01-22 DIAGNOSIS — E119 Type 2 diabetes mellitus without complications: Secondary | ICD-10-CM

## 2016-01-22 DIAGNOSIS — Z23 Encounter for immunization: Secondary | ICD-10-CM | POA: Diagnosis not present

## 2016-01-22 LAB — BAYER DCA HB A1C WAIVED: HB A1C: 6.9 % (ref <7.0)

## 2016-01-22 NOTE — Progress Notes (Signed)
Subjective:    Patient ID: Alexandria Tucker, female    DOB: [birth year], 58 y.o.   MRN: [id number]  Pt presents to the office today for chronic follow up. PT is scheduled to have a benign mass removed in right breast in next month. PT complaining of generalized joint pain in bilateral knees, elbow, back, and shoulder. PT states she has intermittent pain of 10 out 10.  Diabetes  She presents for her follow-up diabetic visit. She has type 2 diabetes mellitus. Her disease course has been stable. Pertinent negatives for hypoglycemia include no confusion, mood changes or pallor. Pertinent negatives for diabetes include no blurred vision, no foot paresthesias, no foot ulcerations and no visual change. Pertinent negatives for hypoglycemia complications include no blackouts and no hospitalization. Symptoms are stable. Pertinent negatives for diabetic complications include no CVA, heart disease, nephropathy or peripheral neuropathy. Risk factors for coronary artery disease include diabetes mellitus, dyslipidemia, family history, hypertension, obesity and post-menopausal. Current diabetic treatment includes oral agent (monotherapy). She is compliant with treatment all of the time. (Pt does not test blood sugars ) An ACE inhibitor/angiotensin II receptor blocker is being taken. Eye exam is current (May 2016).  Hypertension  This is a chronic problem. The current episode started more than 1 year ago. The problem has been resolved since onset. The problem is controlled. Pertinent negatives include no anxiety, blurred vision, malaise/fatigue, palpitations or peripheral edema. Risk factors for coronary artery disease include dyslipidemia, obesity, post-menopausal state and family history. Past treatments include ACE inhibitors and diuretics. The current treatment provides significant improvement. There is no history of kidney disease, CAD/MI, CVA, heart failure or a thyroid problem. There is no history of sleep apnea.    Hyperlipidemia  This is a chronic problem. The current episode started more than 1 year ago. The problem is uncontrolled. Recent lipid tests were reviewed and are high. Exacerbating diseases include diabetes and obesity. She has no history of hypothyroidism. Factors aggravating her hyperlipidemia include fatty foods. Pertinent negatives include no leg pain. Current antihyperlipidemic treatment includes statins. The current treatment provides moderate improvement of lipids. Risk factors for coronary artery disease include diabetes mellitus, dyslipidemia, family history, hypertension, obesity and a sedentary lifestyle.      Review of Systems  Constitutional: Negative.  Negative for malaise/fatigue.  HENT: Negative.   Eyes: Negative.  Negative for blurred vision.  Cardiovascular: Negative.  Negative for palpitations.  Gastrointestinal: Negative.   Endocrine: Negative.   Genitourinary: Negative.   Musculoskeletal: Negative.   Skin: Negative for pallor.  Neurological: Negative.   Hematological: Negative.   Psychiatric/Behavioral: Negative.  Negative for confusion.  All other systems reviewed and are negative.      Objective:   Physical Exam  Constitutional: She is oriented to person, place, and time. She appears well-developed and well-nourished. No distress.  Morbid obesity   HENT:  Head: Normocephalic and atraumatic.  Right Ear: External ear normal.  Left Ear: External ear normal.  Nose: Nose normal.  Mouth/Throat: Oropharynx is clear and moist.  Eyes: Pupils are equal, round, and reactive to light.  Neck: Normal range of motion. Neck supple. No thyromegaly present.  Cardiovascular: Normal rate, regular rhythm, normal heart sounds and intact distal pulses.   No murmur heard. Pulmonary/Chest: Effort normal and breath sounds normal. No respiratory distress. She has no wheezes.  Abdominal: Soft. Bowel sounds are normal. She exhibits no distension. There is no tenderness.   Musculoskeletal: Normal range of motion. She exhibits no edema or tenderness.  Neurological: She is alert and oriented to person, place, and time. She has normal reflexes. No cranial nerve deficit.  Skin: Skin is warm and dry.  Psychiatric: She has a normal mood and affect. Her behavior is normal. Judgment and thought content normal.  Vitals reviewed.    BP 124/78   Pulse 83   Temp 97 F (36.1 C) (Oral)   Ht _0  (1.626 m)   Wt 239 lb 9.6 oz (108.7 kg)   BMI 41.13 kg/m      Assessment & Plan:  1. Essential hypertension - CMP14+EGFR  2. Allergic rhinitis, unspecified allergic rhinitis type - CMP14+EGFR  3. Type 2 diabetes mellitus without complication, without long-term current use of insulin (HCC) - Bayer DCA Hb A1c Waived - CMP14+EGFR - Microalbumin / creatinine urine ratio  4. Hyperlipidemia with target LDL less than 100 - CMP14+EGFR - Lipid panel  5. Obesity, morbid, BMI 40.0-49.9 (HCC) - CMP14+EGFR  6. Joint pain - Arthritis Panel   Continue all meds Labs pending Health Maintenance reviewed Diet and exercise encouraged RTO 3 months  Evelina Dun, FNP

## 2016-01-22 NOTE — Patient Instructions (Signed)
Health Maintenance, Female Adopting a healthy lifestyle and getting preventive care can go a long way to promote health and wellness. Talk with your health care provider about what schedule of regular examinations is right for you. This is a good chance for you to check in with your provider about disease prevention and staying healthy. In between checkups, there are plenty of things you can do on your own. Experts have done a lot of research about which lifestyle changes and preventive measures are most likely to keep you healthy. Ask your health care provider for more information. WEIGHT AND DIET  Eat a healthy diet  Be sure to include plenty of vegetables, fruits, low-fat dairy products, and lean protein.  Do not eat a lot of foods high in solid fats, added sugars, or salt.  Get regular exercise. This is one of the most important things you can do for your health.  Most adults should exercise for at least 150 minutes each week. The exercise should increase your heart rate and make you sweat (moderate-intensity exercise).  Most adults should also do strengthening exercises at least twice a week. This is in addition to the moderate-intensity exercise.  Maintain a healthy weight  Body mass index (BMI) is a measurement that can be used to identify possible weight problems. It estimates body fat based on height and weight. Your health care provider can help determine your BMI and help you achieve or maintain a healthy weight.  For females 20 years of age and older:   A BMI below 18.5 is considered underweight.  A BMI of 18.5 to 24.9 is normal.  A BMI of 25 to 29.9 is considered overweight.  A BMI of 30 and above is considered obese.  Watch levels of cholesterol and blood lipids  You should start having your blood tested for lipids and cholesterol at 58 years of age, then have this test every 5 years.  You may need to have your cholesterol levels checked more often if:  Your lipid  or cholesterol levels are high.  You are older than 58 years of age.  You are at high risk for heart disease.  CANCER SCREENING   Lung Cancer  Lung cancer screening is recommended for adults 55-80 years old who are at high risk for lung cancer because of a history of smoking.  A yearly low-dose CT scan of the lungs is recommended for people who:  Currently smoke.  Have quit within the past 15 years.  Have at least a 30-pack-year history of smoking. A pack year is smoking an average of one pack of cigarettes a day for 1 year.  Yearly screening should continue until it has been 15 years since you quit.  Yearly screening should stop if you develop a health problem that would prevent you from having lung cancer treatment.  Breast Cancer  Practice breast self-awareness. This means understanding how your breasts normally appear and feel.  It also means doing regular breast self-exams. Let your health care provider know about any changes, no matter how small.  If you are in your 20s or 30s, you should have a clinical breast exam (CBE) by a health care provider every 1-3 years as part of a regular health exam.  If you are 40 or older, have a CBE every year. Also consider having a breast X-ray (mammogram) every year.  If you have a family history of breast cancer, talk to your health care provider about genetic screening.  If you   are at high risk for breast cancer, talk to your health care provider about having an MRI and a mammogram every year.  Breast cancer gene (BRCA) assessment is recommended for women who have family members with BRCA-related cancers. BRCA-related cancers include:  Breast.  Ovarian.  Tubal.  Peritoneal cancers.  Results of the assessment will determine the need for genetic counseling and BRCA1 and BRCA2 testing. Cervical Cancer Your health care provider may recommend that you be screened regularly for cancer of the pelvic organs (ovaries, uterus, and  vagina). This screening involves a pelvic examination, including checking for microscopic changes to the surface of your cervix (Pap test). You may be encouraged to have this screening done every 3 years, beginning at age 21.  For women ages 30-65, health care providers may recommend pelvic exams and Pap testing every 3 years, or they may recommend the Pap and pelvic exam, combined with testing for human papilloma virus (HPV), every 5 years. Some types of HPV increase your risk of cervical cancer. Testing for HPV may also be done on women of any age with unclear Pap test results.  Other health care providers may not recommend any screening for nonpregnant women who are considered low risk for pelvic cancer and who do not have symptoms. Ask your health care provider if a screening pelvic exam is right for you.  If you have had past treatment for cervical cancer or a condition that could lead to cancer, you need Pap tests and screening for cancer for at least 20 years after your treatment. If Pap tests have been discontinued, your risk factors (such as having a new sexual partner) need to be reassessed to determine if screening should resume. Some women have medical problems that increase the chance of getting cervical cancer. In these cases, your health care provider may recommend more frequent screening and Pap tests. Colorectal Cancer  This type of cancer can be detected and often prevented.  Routine colorectal cancer screening usually begins at 58 years of age and continues through 58 years of age.  Your health care provider may recommend screening at an earlier age if you have risk factors for colon cancer.  Your health care provider may also recommend using home test kits to check for hidden blood in the stool.  A small camera at the end of a tube can be used to examine your colon directly (sigmoidoscopy or colonoscopy). This is done to check for the earliest forms of colorectal  cancer.  Routine screening usually begins at age 50.  Direct examination of the colon should be repeated every 5-10 years through 58 years of age. However, you may need to be screened more often if early forms of precancerous polyps or small growths are found. Skin Cancer  Check your skin from head to toe regularly.  Tell your health care provider about any new moles or changes in moles, especially if there is a change in a mole's shape or color.  Also tell your health care provider if you have a mole that is larger than the size of a pencil eraser.  Always use sunscreen. Apply sunscreen liberally and repeatedly throughout the day.  Protect yourself by wearing long sleeves, pants, a wide-brimmed hat, and sunglasses whenever you are outside. HEART DISEASE, DIABETES, AND HIGH BLOOD PRESSURE   High blood pressure causes heart disease and increases the risk of stroke. High blood pressure is more likely to develop in:  People who have blood pressure in the high end   of the normal range (130-139/85-89 mm Hg).  People who are overweight or obese.  People who are African American.  If you are 38-23 years of age, have your blood pressure checked every 3-5 years. If you are 61 years of age or older, have your blood pressure checked every year. You should have your blood pressure measured twice--once when you are at a hospital or clinic, and once when you are not at a hospital or clinic. Record the average of the two measurements. To check your blood pressure when you are not at a hospital or clinic, you can use:  An automated blood pressure machine at a pharmacy.  A home blood pressure monitor.  If you are between 45 years and 39 years old, ask your health care provider if you should take aspirin to prevent strokes.  Have regular diabetes screenings. This involves taking a blood sample to check your fasting blood sugar level.  If you are at a normal weight and have a low risk for diabetes,  have this test once every three years after 58 years of age.  If you are overweight and have a high risk for diabetes, consider being tested at a younger age or more often. PREVENTING INFECTION  Hepatitis B  If you have a higher risk for hepatitis B, you should be screened for this virus. You are considered at high risk for hepatitis B if:  You were born in a country where hepatitis B is common. Ask your health care provider which countries are considered high risk.  Your parents were born in a high-risk country, and you have not been immunized against hepatitis B (hepatitis B vaccine).  You have HIV or AIDS.  You use needles to inject street drugs.  You live with someone who has hepatitis B.  You have had sex with someone who has hepatitis B.  You get hemodialysis treatment.  You take certain medicines for conditions, including cancer, organ transplantation, and autoimmune conditions. Hepatitis C  Blood testing is recommended for:  Everyone born from [birth year] through 1963.  Anyone with known risk factors for hepatitis C. Sexually transmitted infections (STIs)  You should be screened for sexually transmitted infections (STIs) including gonorrhea and chlamydia if:  You are sexually active and are younger than 58 years of age.  You are older than 58 years of age and your health care provider tells you that you are at risk for this type of infection.  Your sexual activity has changed since you were last screened and you are at an increased risk for chlamydia or gonorrhea. Ask your health care provider if you are at risk.  If you do not have HIV, but are at risk, it may be recommended that you take a prescription medicine daily to prevent HIV infection. This is called pre-exposure prophylaxis (PrEP). You are considered at risk if:  You are sexually active and do not regularly use condoms or know the HIV status of your partner(s).  You take drugs by injection.  You are sexually  active with a partner who has HIV. Talk with your health care provider about whether you are at high risk of being infected with HIV. If you choose to begin PrEP, you should first be tested for HIV. You should then be tested every 3 months for as long as you are taking PrEP.  PREGNANCY   If you are premenopausal and you may become pregnant, ask your health care provider about preconception counseling.  If you may  become pregnant, take 400 to 800 micrograms (mcg) of folic acid every day.  If you want to prevent pregnancy, talk to your health care provider about birth control (contraception). OSTEOPOROSIS AND MENOPAUSE   Osteoporosis is a disease in which the bones lose minerals and strength with aging. This can result in serious bone fractures. Your risk for osteoporosis can be identified using a bone density scan.  If you are 61 years of age or older, or if you are at risk for osteoporosis and fractures, ask your health care provider if you should be screened.  Ask your health care provider whether you should take a calcium or vitamin D supplement to lower your risk for osteoporosis.  Menopause may have certain physical symptoms and risks.  Hormone replacement therapy may reduce some of these symptoms and risks. Talk to your health care provider about whether hormone replacement therapy is right for you.  HOME CARE INSTRUCTIONS   Schedule regular health, dental, and eye exams.  Stay current with your immunizations.   Do not use any tobacco products including cigarettes, chewing tobacco, or electronic cigarettes.  If you are pregnant, do not drink alcohol.  If you are breastfeeding, limit how much and how often you drink alcohol.  Limit alcohol intake to no more than 1 drink per day for nonpregnant women. One drink equals 12 ounces of beer, 5 ounces of wine, or 1 ounces of hard liquor.  Do not use street drugs.  Do not share needles.  Ask your health care provider for help if  you need support or information about quitting drugs.  Tell your health care provider if you often feel depressed.  Tell your health care provider if you have ever been abused or do not feel safe at home.   This information is not intended to replace advice given to you by your health care provider. Make sure you discuss any questions you have with your health care provider.   Document Released: 11/01/2010 Document Revised: 05/09/2014 Document Reviewed: 03/20/2013 Elsevier Interactive Patient Education [employer].

## 2016-01-23 LAB — ARTHRITIS PANEL
BASOS ABS: 0 10*3/uL (ref 0.0–0.2)
Basos: 1 %
EOS (ABSOLUTE): 0.3 10*3/uL (ref 0.0–0.4)
Eos: 3 %
Hematocrit: 38.6 % (ref 34.0–46.6)
Hemoglobin: 13 g/dL (ref 11.1–15.9)
IMMATURE GRANS (ABS): 0.1 10*3/uL (ref 0.0–0.1)
IMMATURE GRANULOCYTES: 1 %
LYMPHS: 18 %
Lymphocytes Absolute: 1.6 10*3/uL (ref 0.7–3.1)
MCH: 29.5 pg (ref 26.6–33.0)
MCHC: 33.7 g/dL (ref 31.5–35.7)
MCV: 88 fL (ref 79–97)
MONOS ABS: 0.5 10*3/uL (ref 0.1–0.9)
Monocytes: 6 %
NEUTROS PCT: 71 %
Neutrophils Absolute: 6.4 10*3/uL (ref 1.4–7.0)
PLATELETS: 317 10*3/uL (ref 150–379)
RBC: 4.41 x10E6/uL (ref 3.77–5.28)
RDW: 14.4 % (ref 12.3–15.4)
Sed Rate: 6 mm/hr (ref 0–40)
URIC ACID: 5.4 mg/dL (ref 2.5–7.1)
WBC: 8.8 10*3/uL (ref 3.4–10.8)

## 2016-01-23 LAB — LIPID PANEL
CHOLESTEROL TOTAL: 167 mg/dL (ref 100–199)
Chol/HDL Ratio: 5.1 ratio units — ABNORMAL HIGH (ref 0.0–4.4)
HDL: 33 mg/dL — AB (ref >39)
LDL CALC: 92 mg/dL (ref 0–99)
TRIGLYCERIDES: 208 mg/dL — AB (ref 0–149)
VLDL Cholesterol Cal: 42 mg/dL — ABNORMAL HIGH (ref 5–40)

## 2016-01-23 LAB — CMP14+EGFR
ALK PHOS: 76 IU/L (ref 39–117)
ALT: 99 IU/L — AB (ref 0–32)
AST: 79 IU/L — AB (ref 0–40)
Albumin/Globulin Ratio: 1.5 (ref 1.2–2.2)
Albumin: 4.3 g/dL (ref 3.5–5.5)
BUN/Creatinine Ratio: 24 — ABNORMAL HIGH (ref 9–23)
BUN: 16 mg/dL (ref 6–24)
Bilirubin Total: 0.5 mg/dL (ref 0.0–1.2)
CALCIUM: 9.6 mg/dL (ref 8.7–10.2)
CO2: 25 mmol/L (ref 18–29)
CREATININE: 0.67 mg/dL (ref 0.57–1.00)
Chloride: 97 mmol/L (ref 96–106)
GFR calc Af Amer: 112 mL/min/{1.73_m2} (ref >59)
GFR, EST NON AFRICAN AMERICAN: 97 mL/min/{1.73_m2} (ref >59)
GLOBULIN, TOTAL: 2.9 g/dL (ref 1.5–4.5)
Glucose: 142 mg/dL — ABNORMAL HIGH (ref 65–99)
POTASSIUM: 4.1 mmol/L (ref 3.5–5.2)
SODIUM: 137 mmol/L (ref 134–144)
Total Protein: 7.2 g/dL (ref 6.0–8.5)

## 2016-01-23 LAB — MICROALBUMIN / CREATININE URINE RATIO
Creatinine, Urine: 55 mg/dL
MICROALB/CREAT RATIO: 9.6 mg/g{creat} (ref 0.0–30.0)
Microalbumin, Urine: 5.3 ug/mL

## 2016-01-26 ENCOUNTER — Other Ambulatory Visit: Payer: Self-pay | Admitting: Family

## 2016-01-26 DIAGNOSIS — R748 Abnormal levels of other serum enzymes: Secondary | ICD-10-CM

## 2016-01-27 ENCOUNTER — Other Ambulatory Visit: Payer: Self-pay

## 2016-01-27 ENCOUNTER — Telehealth: Payer: Self-pay | Admitting: Family

## 2016-01-27 DIAGNOSIS — J302 Other seasonal allergic rhinitis: Secondary | ICD-10-CM

## 2016-01-27 DIAGNOSIS — I10 Essential (primary) hypertension: Secondary | ICD-10-CM

## 2016-01-27 DIAGNOSIS — E119 Type 2 diabetes mellitus without complications: Secondary | ICD-10-CM

## 2016-01-27 MED ORDER — RAMIPRIL 10 MG PO CAPS: ORAL_CAPSULE | ORAL | 1 refills | Status: DC

## 2016-01-27 MED ORDER — SIMVASTATIN 20 MG PO TABS: 20.0000 mg | ORAL_TABLET | Freq: Every day | ORAL | 1 refills | Status: DC

## 2016-01-27 MED ORDER — HYDROCHLOROTHIAZIDE 25 MG PO TABS: ORAL_TABLET | ORAL | 1 refills | Status: DC

## 2016-01-27 MED ORDER — FLUTICASONE PROPIONATE 50 MCG/ACT NA SUSP: NASAL | 5 refills | Status: DC

## 2016-01-27 MED ORDER — SITAGLIPTIN PHOSPHATE 100 MG PO TABS: 100.0000 mg | ORAL_TABLET | Freq: Every day | ORAL | 1 refills | Status: DC

## 2016-01-27 MED ORDER — METFORMIN HCL 500 MG PO TABS: 500.0000 mg | ORAL_TABLET | Freq: Two times a day (BID) | ORAL | 1 refills | Status: DC

## 2016-01-27 MED ORDER — FEXOFENADINE HCL 180 MG PO TABS: 180.0000 mg | ORAL_TABLET | Freq: Every day | ORAL | 1 refills | Status: AC

## 2016-01-27 NOTE — Telephone Encounter (Signed)
All meds refilled to pharmacy per patients request

## 2016-01-29 ENCOUNTER — Ambulatory Visit: Payer: Self-pay | Admitting: General Surgery

## 2016-01-29 DIAGNOSIS — D241 Benign neoplasm of right breast: Secondary | ICD-10-CM

## 2016-02-02 ENCOUNTER — Ambulatory Visit (HOSPITAL_COMMUNITY): Payer: Commercial Managed Care - PPO

## 2016-02-05 ENCOUNTER — Ambulatory Visit (HOSPITAL_COMMUNITY)
Admission: RE | Admit: 2016-02-05 | Discharge: 2016-02-05 | Disposition: A | Discharge status: Home / Self Care | Payer: Commercial Managed Care - PPO | Source: Ambulatory Visit | Attending: Family | Admitting: Family

## 2016-02-05 DIAGNOSIS — R748 Abnormal levels of other serum enzymes: Secondary | ICD-10-CM | POA: Insufficient documentation

## 2016-02-05 DIAGNOSIS — K760 Fatty (change of) liver, not elsewhere classified: Secondary | ICD-10-CM | POA: Insufficient documentation

## 2016-02-05 DIAGNOSIS — K8020 Calculus of gallbladder without cholecystitis without obstruction: Secondary | ICD-10-CM | POA: Insufficient documentation

## 2016-02-18 ENCOUNTER — Other Ambulatory Visit: Payer: Self-pay | Admitting: General Surgery

## 2016-02-18 DIAGNOSIS — N6310 Unspecified lump in the right breast, unspecified quadrant: Secondary | ICD-10-CM

## 2016-02-18 DIAGNOSIS — D241 Benign neoplasm of right breast: Secondary | ICD-10-CM

## 2016-02-25 ENCOUNTER — Encounter: Payer: Self-pay | Admitting: *Deleted

## 2016-03-23 ENCOUNTER — Encounter: Payer: Self-pay | Admitting: Family

## 2016-03-23 ENCOUNTER — Ambulatory Visit (INDEPENDENT_AMBULATORY_CARE_PROVIDER_SITE_OTHER): Payer: Commercial Managed Care - PPO | Admitting: Family

## 2016-03-23 VITALS — BP 129/70 | HR 91 | Temp 97.4°F | Ht 64.0 in | Wt 238.0 lb

## 2016-03-23 DIAGNOSIS — J0100 Acute maxillary sinusitis, unspecified: Secondary | ICD-10-CM | POA: Diagnosis not present

## 2016-03-23 MED ORDER — DOXYCYCLINE HYCLATE 100 MG PO TABS: 100.0000 mg | ORAL_TABLET | Freq: Two times a day (BID) | ORAL | 0 refills | Status: DC

## 2016-03-23 NOTE — Patient Instructions (Signed)

## 2016-03-23 NOTE — Progress Notes (Signed)
   Subjective:    Patient ID: Alexandria Tucker, female    DOB: 1955/11/08, 60 y.o.   MRN: [id number]  Sinusitis  This is a new problem. The current episode started in the past 7 days. The problem has been gradually worsening since onset. There has been no fever. Her pain is at a severity of 8/10. The pain is mild. Associated symptoms include congestion, coughing, headaches, a hoarse voice, sinus pressure, sneezing, a sore throat and swollen glands. Pertinent negatives include no ear pain. Past treatments include oral decongestants, lying down and acetaminophen. The treatment provided mild relief.  Cough  Associated symptoms include headaches and a sore throat. Pertinent negatives include no ear pain.      Review of Systems  HENT: Positive for congestion, hoarse voice, sinus pressure, sneezing and sore throat. Negative for ear pain.   Respiratory: Positive for cough.   Neurological: Positive for headaches.  All other systems reviewed and are negative.      Objective:   Physical Exam  Constitutional: She is oriented to person, place, and time. She appears well-developed and well-nourished. No distress.  HENT:  Head: Normocephalic and atraumatic.  Right Ear: External ear normal.  Left Ear: External ear normal.  Nose: Mucosal edema and rhinorrhea present. Right sinus exhibits maxillary sinus tenderness. Left sinus exhibits maxillary sinus tenderness.  Mouth/Throat: Posterior oropharyngeal erythema present.  Eyes: Pupils are equal, round, and reactive to light.  Neck: Normal range of motion. Neck supple. No thyromegaly present.  Cardiovascular: Normal rate, regular rhythm, normal heart sounds and intact distal pulses.   No murmur heard. Pulmonary/Chest: Effort normal and breath sounds normal. No respiratory distress. She has no wheezes.  Abdominal: Soft. Bowel sounds are normal. She exhibits no distension. There is no tenderness.  Musculoskeletal: Normal range of motion. She exhibits no  edema or tenderness.  Neurological: She is alert and oriented to person, place, and time.  Skin: Skin is warm and dry.  Psychiatric: She has a normal mood and affect. Her behavior is normal. Judgment and thought content normal.  Vitals reviewed.     BP 129/70   Pulse 91   Temp 97.4 F (36.3 C) (Oral)   Ht 5\' 4"  (1.626 m)   Wt 238 lb (108 kg)   BMI 40.85 kg/m      Assessment & Plan:  1. Acute maxillary sinusitis, recurrence not specified -- Take meds as prescribed - Use a cool mist humidifier  -Use saline nose sprays frequently -Saline irrigations of the nose can be very helpful if done frequently.  * 4X daily for 1 week*  * Use of a nettie pot can be helpful with this. Follow directions with this* -Force fluids -For any cough or congestion  Use plain Mucinex- regular strength or max strength is fine   * Children- consult with Pharmacist for dosing -For fever or aces or pains- take tylenol or ibuprofen appropriate for age and weight.  * for fevers greater than 101 orally you may alternate ibuprofen and tylenol every  3 hours. -Throat lozenges if help - doxycycline (VIBRA-TABS) 100 MG tablet; Take 1 tablet (100 mg total) by mouth 2 (two) times daily.  Dispense: 20 tablet; Refill: 0  Evelina Dun, FNP

## 2016-03-29 ENCOUNTER — Encounter (HOSPITAL_BASED_OUTPATIENT_CLINIC_OR_DEPARTMENT_OTHER)
Admission: RE | Admit: 2016-03-29 | Discharge: 2016-03-29 | Disposition: A | Discharge status: Home / Self Care | Payer: Commercial Managed Care - PPO | Source: Ambulatory Visit | Attending: General Surgery | Admitting: General Surgery

## 2016-03-29 ENCOUNTER — Encounter (HOSPITAL_BASED_OUTPATIENT_CLINIC_OR_DEPARTMENT_OTHER): Payer: Self-pay | Admitting: *Deleted

## 2016-03-29 DIAGNOSIS — D241 Benign neoplasm of right breast: Secondary | ICD-10-CM | POA: Insufficient documentation

## 2016-03-29 DIAGNOSIS — E7800 Pure hypercholesterolemia, unspecified: Secondary | ICD-10-CM | POA: Diagnosis not present

## 2016-03-29 DIAGNOSIS — Z7982 Long term (current) use of aspirin: Secondary | ICD-10-CM | POA: Diagnosis not present

## 2016-03-29 DIAGNOSIS — Z7951 Long term (current) use of inhaled steroids: Secondary | ICD-10-CM | POA: Diagnosis not present

## 2016-03-29 DIAGNOSIS — N63 Unspecified lump in unspecified breast: Secondary | ICD-10-CM | POA: Diagnosis present

## 2016-03-29 DIAGNOSIS — Z87891 Personal history of nicotine dependence: Secondary | ICD-10-CM | POA: Diagnosis not present

## 2016-03-29 DIAGNOSIS — I10 Essential (primary) hypertension: Secondary | ICD-10-CM | POA: Diagnosis not present

## 2016-03-29 DIAGNOSIS — Z79899 Other long term (current) drug therapy: Secondary | ICD-10-CM | POA: Diagnosis not present

## 2016-03-29 DIAGNOSIS — Z6841 Body Mass Index (BMI) 40.0 and over, adult: Secondary | ICD-10-CM | POA: Diagnosis not present

## 2016-03-29 DIAGNOSIS — E119 Type 2 diabetes mellitus without complications: Secondary | ICD-10-CM | POA: Diagnosis not present

## 2016-03-29 DIAGNOSIS — Z7984 Long term (current) use of oral hypoglycemic drugs: Secondary | ICD-10-CM | POA: Diagnosis not present

## 2016-03-29 LAB — BASIC METABOLIC PANEL
ANION GAP: 9 (ref 5–15)
BUN: 15 mg/dL (ref 6–20)
CHLORIDE: 101 mmol/L (ref 101–111)
CO2: 28 mmol/L (ref 22–32)
Calcium: 9.5 mg/dL (ref 8.9–10.3)
Creatinine, Ser: 0.73 mg/dL (ref 0.44–1.00)
GFR calc Af Amer: >60 mL/min (ref >60)
GFR calc non Af Amer: >60 mL/min (ref >60)
GLUCOSE: 156 mg/dL — AB (ref 65–99)
POTASSIUM: 3.4 mmol/L — AB (ref 3.5–5.1)
Sodium: 138 mmol/L (ref 135–145)

## 2016-03-29 NOTE — Progress Notes (Signed)
Pt instructed to drink bottle of water before 0400 with teach back on day of surgery.

## 2016-03-31 ENCOUNTER — Ambulatory Visit
Admission: RE | Admit: 2016-03-31 | Discharge: 2016-03-31 | Disposition: A | Discharge status: Home / Self Care | Payer: Commercial Managed Care - PPO | Source: Ambulatory Visit | Attending: General Surgery | Admitting: General Surgery

## 2016-03-31 DIAGNOSIS — D241 Benign neoplasm of right breast: Secondary | ICD-10-CM

## 2016-03-31 NOTE — H&P (Signed)
History of Present Illness  The patient is a 60 year old female who presents with a breast mass. Patient is a pleasant 60 year old female referred by Dr. Shelly Bombard for recent abnormal imaging and biopsy revealing papilloma. The patient had normal mammograms in May of this year. She has no personal history of breast disease or family history of breast cancer. A couple of months ago she began having intermittent bloody right nipple discharge. This is usually just when expressing that has been spontaneous occasionally. This prompted repeat imaging including mammogram that was negative but ultrasound revealing a 5 mm retro-areolar intraductal mass at the 9 o'clock position. Large core needle biopsy was recommended and performed which has revealed intraductal papilloma with usual ductal hyperplasia. The patient is referred to consider excision of this area.   Other Problem Diabetes Mellitus High blood pressure Hypercholesterolemia  Past Surgical History  Breast Biopsy Right. Cesarean Section - 1  Diagnostic Studies History  Colonoscopy 1-5 years ago Mammogram within last year Pap Smear 1-5 years ago  Allergies Augmentin *PENICILLINS* Niaspan *ANTIHYPERLIPIDEMICS*  Medication History  Aspirin (81MG  Tablet Chewable, Oral) Active. MetFORMIN HCl (500MG  Tablet, Oral) Active. Ramipril (10MG  Capsule, Oral) Active. Simvastatin (20MG  Tablet, Oral) Active. Januvia (100MG  Tablet, Oral) Active. HydroCHLOROthiazide (25MG  Tablet, Oral) Active. Allegra (180MG  Tablet, Oral) Active. Flonase (50MCG/ACT Suspension, Nasal as needed) Active. Medications Reconciled  Social History  Alcohol use Occasional alcohol use. No caffeine use No drug use Tobacco use Former smoker.  Family History Heart Disease Brother, Father. Heart disease in female family member before age 34 Hypertension Brother, Father, Mother.  Pregnancy / Birth History  Age at menarche 14 years. Age of  menopause 49-50 Gravida 1 Irregular periods Maternal age 35-25 Para 1    Review of Systems  General Not Present- Appetite Loss, Chills, Fatigue, Fever, Night Sweats, Weight Gain and Weight Loss. Skin Not Present- Change in Wart/Mole, Dryness, Hives, Jaundice, New Lesions, Non-Healing Wounds, Rash and Ulcer. HEENT Present- Seasonal Allergies, Sinus Pain and Wears glasses/contact lenses. Not Present- Earache, Hearing Loss, Hoarseness, Nose Bleed, Oral Ulcers, Ringing in the Ears, Sore Throat, Visual Disturbances and Yellow Eyes. Breast Present- Nipple Discharge. Not Present- Breast Mass, Breast Pain and Skin Changes. Cardiovascular Present- Leg Cramps. Not Present- Chest Pain, Difficulty Breathing Lying Down, Palpitations, Rapid Heart Rate, Shortness of Breath and Swelling of Extremities. Female Genitourinary Not Present- Frequency, Nocturia, Painful Urination, Pelvic Pain and Urgency. Musculoskeletal Present- Joint Pain and Joint Stiffness. Not Present- Back Pain, Muscle Pain, Muscle Weakness and Swelling of Extremities.  Vitals   Weight: 234 lb Height: 64in Body Surface Area: 2.09 m Body Mass Index: 40.17 kg/m  Temp.: 98.19F(Temporal)  Pulse: 90 (Regular)  BP: 132/80 (Sitting, Left Arm, Standard)       Physical Exam  The physical exam findings are as follows: Note:General: Alert, moderately obese Caucasian female, in no distress Skin: Warm and dry without rash or infection. HEENT: No palpable masses or thyromegaly. Sclera nonicteric. Pupils equal round and reactive. Lymph nodes: No cervical, supraclavicular, nodes palpable. Lungs: Breath sounds clear and equal. No wheezing or increased work of breathing. Cardiovascular: Regular rate and rhythm without murmer. No JVD or edema. Extremities: No edema or joint swelling or deformity. No chronic venous stasis changes. Neurologic: Alert and fully oriented. Gait normal. No focal weakness. Psychiatric: Normal mood  and affect. Thought content appropriate with normal judgement and insight    Assessment & Plan  INTRADUCTAL PAPILLOMA OF BREAST, RIGHT (D24.1) Impression: Right nipple discharge with 5 mm mass and  biopsy showing intraductal papilloma. I discussed the diagnosis with the patient and her husband. We discussed that standard recommendation would be excisional biopsy to rule out the small chance of underlying malignancy. I discussed the option of continued observation. We discussed the surgery in detail including its nature and expected recovery as well as risks of bleeding, infection, anesthetic complications or possible need for further surgery a stone in the final diagnosis. All questions were answered. They understand and would like to proceed. Current Plans Pt Education - [facility] Breast Biopsy HCI: discussed with patient and provided information. Radioactive seed localized right breast lumpectomy under general anesthesia as an outpatient

## 2016-04-01 ENCOUNTER — Encounter (HOSPITAL_BASED_OUTPATIENT_CLINIC_OR_DEPARTMENT_OTHER): Payer: Self-pay | Admitting: *Deleted

## 2016-04-01 ENCOUNTER — Ambulatory Visit (HOSPITAL_BASED_OUTPATIENT_CLINIC_OR_DEPARTMENT_OTHER): Payer: Commercial Managed Care - PPO | Admitting: Anesthesiology

## 2016-04-01 ENCOUNTER — Encounter (HOSPITAL_BASED_OUTPATIENT_CLINIC_OR_DEPARTMENT_OTHER)
Admission: RE | Disposition: A | Discharge status: Home / Self Care | Payer: Self-pay | Source: Ambulatory Visit | Attending: General Surgery

## 2016-04-01 ENCOUNTER — Ambulatory Visit
Admission: RE | Admit: 2016-04-01 | Discharge: 2016-04-01 | Disposition: A | Discharge status: Home / Self Care | Payer: Commercial Managed Care - PPO | Source: Ambulatory Visit | Attending: General Surgery | Admitting: General Surgery

## 2016-04-01 ENCOUNTER — Ambulatory Visit (HOSPITAL_BASED_OUTPATIENT_CLINIC_OR_DEPARTMENT_OTHER)
Admission: RE | Admit: 2016-04-01 | Discharge: 2016-04-01 | Disposition: A | Discharge status: Home / Self Care | Payer: Commercial Managed Care - PPO | Source: Ambulatory Visit | Attending: General Surgery | Admitting: General Surgery

## 2016-04-01 DIAGNOSIS — Z7951 Long term (current) use of inhaled steroids: Secondary | ICD-10-CM | POA: Insufficient documentation

## 2016-04-01 DIAGNOSIS — D241 Benign neoplasm of right breast: Secondary | ICD-10-CM | POA: Diagnosis not present

## 2016-04-01 DIAGNOSIS — Z7982 Long term (current) use of aspirin: Secondary | ICD-10-CM | POA: Insufficient documentation

## 2016-04-01 DIAGNOSIS — E7800 Pure hypercholesterolemia, unspecified: Secondary | ICD-10-CM | POA: Insufficient documentation

## 2016-04-01 DIAGNOSIS — Z79899 Other long term (current) drug therapy: Secondary | ICD-10-CM | POA: Insufficient documentation

## 2016-04-01 DIAGNOSIS — Z7984 Long term (current) use of oral hypoglycemic drugs: Secondary | ICD-10-CM | POA: Insufficient documentation

## 2016-04-01 DIAGNOSIS — I10 Essential (primary) hypertension: Secondary | ICD-10-CM | POA: Insufficient documentation

## 2016-04-01 DIAGNOSIS — E119 Type 2 diabetes mellitus without complications: Secondary | ICD-10-CM | POA: Insufficient documentation

## 2016-04-01 DIAGNOSIS — Z87891 Personal history of nicotine dependence: Secondary | ICD-10-CM | POA: Insufficient documentation

## 2016-04-01 DIAGNOSIS — Z6841 Body Mass Index (BMI) 40.0 and over, adult: Secondary | ICD-10-CM | POA: Insufficient documentation

## 2016-04-01 DIAGNOSIS — N6310 Unspecified lump in the right breast, unspecified quadrant: Secondary | ICD-10-CM

## 2016-04-01 HISTORY — DX: Unspecified lump in the right breast, unspecified quadrant: N63.10

## 2016-04-01 HISTORY — PX: BREAST LUMPECTOMY WITH RADIOACTIVE SEED LOCALIZATION: SHX6424

## 2016-04-01 LAB — GLUCOSE, CAPILLARY
GLUCOSE-CAPILLARY: 149 mg/dL — AB (ref 65–99)
Glucose-Capillary: 154 mg/dL — ABNORMAL HIGH (ref 65–99)

## 2016-04-01 SURGERY — BREAST LUMPECTOMY WITH RADIOACTIVE SEED LOCALIZATION
Anesthesia: General | Site: Breast | Laterality: Right

## 2016-04-01 MED ORDER — BUPIVACAINE-EPINEPHRINE (PF) 0.25% -1:200000 IJ SOLN
INTRAMUSCULAR | Status: DC | PRN
  Administered 2016-04-01: 10 mL

## 2016-04-01 MED ORDER — ACETAMINOPHEN 500 MG PO TABS
1000.0000 mg | ORAL_TABLET | ORAL | Status: AC
  Administered 2016-04-01: 1000 mg via ORAL

## 2016-04-01 MED ORDER — GABAPENTIN 300 MG PO CAPS
300.0000 mg | ORAL_CAPSULE | ORAL | Status: AC
  Administered 2016-04-01: 300 mg via ORAL

## 2016-04-01 MED ORDER — MIDAZOLAM HCL 2 MG/2ML IJ SOLN
INTRAMUSCULAR | Status: AC
  Filled 2016-04-01: qty 2

## 2016-04-01 MED ORDER — GABAPENTIN 300 MG PO CAPS
ORAL_CAPSULE | ORAL | Status: AC
  Filled 2016-04-01: qty 1

## 2016-04-01 MED ORDER — DEXAMETHASONE SODIUM PHOSPHATE 4 MG/ML IJ SOLN
INTRAMUSCULAR | Status: DC | PRN
  Administered 2016-04-01: 5 mg via INTRAVENOUS

## 2016-04-01 MED ORDER — FENTANYL CITRATE (PF) 100 MCG/2ML IJ SOLN
INTRAMUSCULAR | Status: AC
  Filled 2016-04-01: qty 2

## 2016-04-01 MED ORDER — MIDAZOLAM HCL 2 MG/2ML IJ SOLN
1.0000 mg | INTRAMUSCULAR | Status: DC | PRN
  Administered 2016-04-01: 2 mg via INTRAVENOUS

## 2016-04-01 MED ORDER — ONDANSETRON HCL 4 MG/2ML IJ SOLN
INTRAMUSCULAR | Status: AC
  Filled 2016-04-01: qty 2

## 2016-04-01 MED ORDER — OXYCODONE HCL 5 MG PO TABS: 5.0000 mg | ORAL_TABLET | Freq: Once | ORAL | Status: DC | PRN

## 2016-04-01 MED ORDER — PROPOFOL 500 MG/50ML IV EMUL
INTRAVENOUS | Status: AC
  Filled 2016-04-01: qty 50

## 2016-04-01 MED ORDER — OXYCODONE HCL 5 MG/5ML PO SOLN: 5.0000 mg | Freq: Once | ORAL | Status: DC | PRN

## 2016-04-01 MED ORDER — CHLORHEXIDINE GLUCONATE CLOTH 2 % EX PADS: 6.0000 | MEDICATED_PAD | Freq: Once | CUTANEOUS | Status: DC

## 2016-04-01 MED ORDER — PHENYLEPHRINE HCL 10 MG/ML IJ SOLN
INTRAMUSCULAR | Status: DC | PRN
  Administered 2016-04-01: 80 ug via INTRAVENOUS

## 2016-04-01 MED ORDER — ACETAMINOPHEN 500 MG PO TABS
ORAL_TABLET | ORAL | Status: AC
  Filled 2016-04-01: qty 2

## 2016-04-01 MED ORDER — KETOROLAC TROMETHAMINE 30 MG/ML IJ SOLN
30.0000 mg | Freq: Once | INTRAMUSCULAR | Status: DC | PRN
Start: 2016-04-01 — End: 2016-04-01

## 2016-04-01 MED ORDER — HYDROCODONE-ACETAMINOPHEN 5-325 MG PO TABS: 1.0000 | ORAL_TABLET | ORAL | 0 refills | Status: DC | PRN

## 2016-04-01 MED ORDER — CELECOXIB 400 MG PO CAPS
400.0000 mg | ORAL_CAPSULE | ORAL | Status: AC
  Administered 2016-04-01: 400 mg via ORAL

## 2016-04-01 MED ORDER — LACTATED RINGERS IV SOLN
INTRAVENOUS | Status: DC
  Administered 2016-04-01: 07:00:00 via INTRAVENOUS

## 2016-04-01 MED ORDER — PROPOFOL 10 MG/ML IV BOLUS
INTRAVENOUS | Status: DC | PRN
  Administered 2016-04-01: 200 mg via INTRAVENOUS

## 2016-04-01 MED ORDER — CIPROFLOXACIN IN D5W 400 MG/200ML IV SOLN
400.0000 mg | INTRAVENOUS | Status: AC
  Administered 2016-04-01: 400 mg via INTRAVENOUS

## 2016-04-01 MED ORDER — LIDOCAINE 2% (20 MG/ML) 5 ML SYRINGE
INTRAMUSCULAR | Status: DC | PRN
  Administered 2016-04-01: 60 mg via INTRAVENOUS

## 2016-04-01 MED ORDER — FENTANYL CITRATE (PF) 100 MCG/2ML IJ SOLN
50.0000 ug | INTRAMUSCULAR | Status: DC | PRN
  Administered 2016-04-01: 100 ug via INTRAVENOUS

## 2016-04-01 MED ORDER — ONDANSETRON HCL 4 MG/2ML IJ SOLN
INTRAMUSCULAR | Status: DC | PRN
  Administered 2016-04-01: 4 mg via INTRAVENOUS

## 2016-04-01 MED ORDER — DEXAMETHASONE SODIUM PHOSPHATE 10 MG/ML IJ SOLN
INTRAMUSCULAR | Status: AC
  Filled 2016-04-01: qty 1

## 2016-04-01 MED ORDER — FENTANYL CITRATE (PF) 100 MCG/2ML IJ SOLN
25.0000 ug | INTRAMUSCULAR | Status: DC | PRN
  Administered 2016-04-01 (×3): 25 ug via INTRAVENOUS

## 2016-04-01 MED ORDER — SCOPOLAMINE 1 MG/3DAYS TD PT72: 1.0000 | MEDICATED_PATCH | Freq: Once | TRANSDERMAL | Status: DC | PRN

## 2016-04-01 MED ORDER — BUPIVACAINE-EPINEPHRINE (PF) 0.25% -1:200000 IJ SOLN
INTRAMUSCULAR | Status: AC
  Filled 2016-04-01: qty 30

## 2016-04-01 MED ORDER — PROMETHAZINE HCL 25 MG/ML IJ SOLN: 6.2500 mg | INTRAMUSCULAR | Status: DC | PRN

## 2016-04-01 MED ORDER — BUPIVACAINE HCL (PF) 0.25 % IJ SOLN
INTRAMUSCULAR | Status: AC
  Filled 2016-04-01: qty 60

## 2016-04-01 MED ORDER — CELECOXIB 200 MG PO CAPS
ORAL_CAPSULE | ORAL | Status: AC
  Filled 2016-04-01: qty 2

## 2016-04-01 MED ORDER — CIPROFLOXACIN IN D5W 400 MG/200ML IV SOLN
INTRAVENOUS | Status: AC
Start: 2016-04-01 — End: 2016-04-01
  Filled 2016-04-01: qty 200

## 2016-04-01 SURGICAL SUPPLY — 52 items
ADH SKN CLS APL DERMABOND .7 (GAUZE/BANDAGES/DRESSINGS) ×1
BINDER BREAST LRG (GAUZE/BANDAGES/DRESSINGS) IMPLANT
BINDER BREAST MEDIUM (GAUZE/BANDAGES/DRESSINGS) IMPLANT
BINDER BREAST XLRG (GAUZE/BANDAGES/DRESSINGS) IMPLANT
BINDER BREAST XXLRG (GAUZE/BANDAGES/DRESSINGS) ×1 IMPLANT
BLADE SURG 15 STRL LF DISP TIS (BLADE) ×1 IMPLANT
BLADE SURG 15 STRL SS (BLADE) ×2
CANISTER SUC SOCK COL 7IN (MISCELLANEOUS) IMPLANT
CANISTER SUCT 1200ML W/VALVE (MISCELLANEOUS) IMPLANT
CHLORAPREP W/TINT 26ML (MISCELLANEOUS) ×2 IMPLANT
CLIP TI WIDE RED SMALL 6 (CLIP) ×1 IMPLANT
COVER BACK TABLE 60X90IN (DRAPES) ×2 IMPLANT
COVER MAYO STAND STRL (DRAPES) ×2 IMPLANT
COVER PROBE W GEL 5X96 (DRAPES) ×2 IMPLANT
DECANTER SPIKE VIAL GLASS SM (MISCELLANEOUS) IMPLANT
DERMABOND ADVANCED (GAUZE/BANDAGES/DRESSINGS) ×1
DERMABOND ADVANCED .7 DNX12 (GAUZE/BANDAGES/DRESSINGS) ×1 IMPLANT
DEVICE DUBIN W/COMP PLATE 8390 (MISCELLANEOUS) ×2 IMPLANT
DRAPE LAPAROSCOPIC ABDOMINAL (DRAPES) ×2 IMPLANT
DRAPE UTILITY XL STRL (DRAPES) ×2 IMPLANT
ELECT COATED BLADE 2.86 ST (ELECTRODE) ×2 IMPLANT
ELECT REM PT RETURN 9FT ADLT (ELECTROSURGICAL) ×2
ELECTRODE REM PT RTRN 9FT ADLT (ELECTROSURGICAL) ×1 IMPLANT
GLOVE BIOGEL PI IND STRL 7.0 (GLOVE) IMPLANT
GLOVE BIOGEL PI IND STRL 8 (GLOVE) ×1 IMPLANT
GLOVE BIOGEL PI INDICATOR 7.0 (GLOVE) ×1
GLOVE BIOGEL PI INDICATOR 8 (GLOVE) ×2
GLOVE ECLIPSE 7.5 STRL STRAW (GLOVE) ×3 IMPLANT
GLOVE SURG SYN 7.5  E (GLOVE) ×1
GLOVE SURG SYN 7.5 E (GLOVE) ×1 IMPLANT
GLOVE SURG SYN 7.5 PF PI (GLOVE) IMPLANT
GOWN STRL REUS W/ TWL LRG LVL3 (GOWN DISPOSABLE) ×1 IMPLANT
GOWN STRL REUS W/ TWL XL LVL3 (GOWN DISPOSABLE) ×1 IMPLANT
GOWN STRL REUS W/TWL 2XL LVL3 (GOWN DISPOSABLE) ×1 IMPLANT
GOWN STRL REUS W/TWL LRG LVL3 (GOWN DISPOSABLE)
GOWN STRL REUS W/TWL XL LVL3 (GOWN DISPOSABLE) ×2
ILLUMINATOR WAVEGUIDE N/F (MISCELLANEOUS) IMPLANT
KIT MARKER MARGIN INK (KITS) ×2 IMPLANT
NDL HYPO 25X1 1.5 SAFETY (NEEDLE) ×1 IMPLANT
NEEDLE HYPO 25X1 1.5 SAFETY (NEEDLE) ×2 IMPLANT
NS IRRIG 1000ML POUR BTL (IV SOLUTION) IMPLANT
PACK BASIN DAY SURGERY FS (CUSTOM PROCEDURE TRAY) ×2 IMPLANT
PENCIL BUTTON HOLSTER BLD 10FT (ELECTRODE) ×2 IMPLANT
SLEEVE SCD COMPRESS KNEE MED (MISCELLANEOUS) ×2 IMPLANT
SPONGE LAP 4X18 X RAY DECT (DISPOSABLE) ×2 IMPLANT
SUT MON AB 5-0 PS2 18 (SUTURE) ×2 IMPLANT
SUT VICRYL 3-0 CR8 SH (SUTURE) ×2 IMPLANT
SYR CONTROL 10ML LL (SYRINGE) ×2 IMPLANT
TOWEL OR 17X24 6PK STRL BLUE (TOWEL DISPOSABLE) ×2 IMPLANT
TOWEL OR NON WOVEN STRL DISP B (DISPOSABLE) ×2 IMPLANT
TUBE CONNECTING 20X1/4 (TUBING) ×1 IMPLANT
YANKAUER SUCT BULB TIP NO VENT (SUCTIONS) ×1 IMPLANT

## 2016-04-01 NOTE — Discharge Instructions (Signed)
[facility] °Office Phone Number [phone] ° °BREAST BIOPSY/ PARTIAL MASTECTOMY: POST OP INSTRUCTIONS ° °Always review your discharge instruction sheet given to you by the facility where your surgery was performed. ° °IF YOU HAVE DISABILITY OR FAMILY LEAVE FORMS, YOU MUST BRING THEM TO THE OFFICE FOR PROCESSING.  DO NOT GIVE THEM TO YOUR DOCTOR. ° °1. A prescription for pain medication may be given to you upon discharge.  Take your pain medication as prescribed, if needed.  If narcotic pain medicine is not needed, then you may take acetaminophen (Tylenol) or ibuprofen (Advil) as needed. °2. Take your usually prescribed medications unless otherwise directed °3. If you need a refill on your pain medication, please contact your pharmacy.  They will contact our office to request authorization.  Prescriptions will not be filled after 5pm or on week-ends. °4. You should eat very light the first 24 hours after surgery, such as soup, crackers, pudding, etc.  Resume your normal diet the day after surgery. °5. Most patients will experience some swelling and bruising in the breast.  Ice packs and a good support bra will help.  Swelling and bruising can take several days to resolve.  °6. It is common to experience some constipation if taking pain medication after surgery.  Increasing fluid intake and taking a stool softener will usually help or prevent this problem from occurring.  A mild laxative (Milk of Magnesia or Miralax) should be taken according to package directions if there are no bowel movements after 48 hours. °7. Unless discharge instructions indicate otherwise, you may remove your bandages 24-48 hours after surgery, and you may shower at that time.  You may have steri-strips (small skin tapes) in place directly over the incision.  These strips should be left on the skin for 7-10 days.  If your surgeon used skin glue on the incision, you may shower in 24 hours.  The glue will flake off over the  next 2-3 weeks.  Any sutures or staples will be removed at the office during your follow-up visit. °8. ACTIVITIES:  You may resume regular daily activities (gradually increasing) beginning the next day.  Wearing a good support bra or sports bra minimizes pain and swelling.  You may have sexual intercourse when it is comfortable. °a. You may drive when you no longer are taking prescription pain medication, you can comfortably wear a seatbelt, and you can safely maneuver your car and apply brakes. °b. RETURN TO WORK:  ______________________________________________________________________________________ °9. You should see your doctor in the office for a follow-up appointment approximately two weeks after your surgery.  Your doctor’s nurse will typically make your follow-up appointment when she calls you with your pathology report.  Expect your pathology report 2-3 business days after your surgery.  You may call to check if you do not hear from us after three days. °10. OTHER INSTRUCTIONS: _______________________________________________________________________________________________ _____________________________________________________________________________________________________________________________________ °_____________________________________________________________________________________________________________________________________ °_____________________________________________________________________________________________________________________________________ ° °WHEN TO CALL YOUR DOCTOR: °1. Fever over 101.0 °2. Nausea and/or vomiting. °3. Extreme swelling or bruising. °4. Continued bleeding from incision. °5. Increased pain, redness, or drainage from the incision. ° °The clinic staff is available to answer your questions during regular business hours.  Please don’t hesitate to call and ask to speak to one of the nurses for clinical concerns.  If you have a medical emergency, go to the nearest  emergency room or call 911.  A surgeon from [facility] is always on call at the hospital. ° °For further questions, please visit [email or web address]  ° ° ° °  Post Anesthesia Home Care Instructions ° °Activity: °Get plenty of rest for the remainder of the day. A responsible adult should stay with you for 24 hours following the procedure.  °For the next 24 hours, DO NOT: °-Drive a car °-Operate machinery °-Drink alcoholic beverages °-Take any medication unless instructed by your physician °-Make any legal decisions or sign important papers. ° °Meals: °Start with liquid foods such as gelatin or soup. Progress to regular foods as tolerated. Avoid greasy, spicy, heavy foods. If nausea and/or vomiting occur, drink only clear liquids until the nausea and/or vomiting subsides. Call your physician if vomiting continues. ° °Special Instructions/Symptoms: °Your throat may feel dry or sore from the anesthesia or the breathing tube placed in your throat during surgery. If this causes discomfort, gargle with warm salt water. The discomfort should disappear within 24 hours. ° °If you had a scopolamine patch placed behind your ear for the management of post- operative nausea and/or vomiting: ° °1. The medication in the patch is effective for 72 hours, after which it should be removed.  Wrap patch in a tissue and discard in the trash. Wash hands thoroughly with soap and water. °2. You may remove the patch earlier than 72 hours if you experience unpleasant side effects which may include dry mouth, dizziness or visual disturbances. °3. Avoid touching the patch. Wash your hands with soap and water after contact with the patch. °  ° °

## 2016-04-01 NOTE — Transfer of Care (Signed)
Immediate Anesthesia Transfer of Care Note  Patient: Alexandria Tucker  Procedure(s) Performed: Procedure(s): RIGHT BREAST LUMPECTOMY WITH RADIOACTIVE SEED LOCALIZATION (Right)  Patient Location: PACU  Anesthesia Type:General  Level of Consciousness: awake, alert  and oriented  Airway & Oxygen Therapy: Patient Spontanous Breathing and Patient connected to face mask oxygen  Post-op Assessment: Report given to RN and Post -op Vital signs reviewed and stable  Post vital signs: Reviewed and stable  Last Vitals:  Vitals:   04/01/16 0636  BP: 132/70  Pulse: 72  Resp: 18  Temp: 36.6 C    Last Pain:  Vitals:   04/01/16 0636  TempSrc: Oral      Patients Stated Pain Goal: 0 ([id number])  Complications: No apparent anesthesia complications

## 2016-04-01 NOTE — Interval H&P Note (Signed)
History and Physical Interval Note:  04/01/2016 7:22 AM  Alexandria Tucker  has presented today for surgery, with the diagnosis of RIGHT BREAST PAPILLOMA  The various methods of treatment have been discussed with the patient and family. After consideration of risks, benefits and other options for treatment, the patient has consented to  Procedure(s): RIGHT BREAST LUMPECTOMY WITH RADIOACTIVE SEED LOCALIZATION (Right) as a surgical intervention .  The patient's history has been reviewed, patient examined, no change in status, stable for surgery.  I have reviewed the patient's chart and labs.  Questions were answered to the patient's satisfaction.     [name] T

## 2016-04-01 NOTE — Anesthesia Procedure Notes (Signed)
Procedure Name: LMA Insertion Date/Time: 04/01/2016 7:34 AM Performed by: Maryella Shivers Pre-anesthesia Checklist: Patient identified, Emergency Drugs available, Suction available and Patient being monitored Patient Re-evaluated:Patient Re-evaluated prior to inductionOxygen Delivery Method: Circle system utilized Preoxygenation: Pre-oxygenation with 100% oxygen Intubation Type: IV induction Ventilation: Mask ventilation without difficulty LMA: LMA inserted LMA Size: 4.0 Number of attempts: 1 Airway Equipment and Method: Bite block Placement Confirmation: positive ETCO2 Tube secured with: Tape Dental Injury: Teeth and Oropharynx as per pre-operative assessment

## 2016-04-01 NOTE — Anesthesia Postprocedure Evaluation (Signed)
Anesthesia Post Note  Patient: Alexandria Tucker  Procedure(s) Performed: Procedure(s) (LRB): RIGHT BREAST LUMPECTOMY WITH RADIOACTIVE SEED LOCALIZATION (Right)  Patient location during evaluation: PACU Anesthesia Type: General Level of consciousness: awake and alert Pain management: pain level controlled Vital Signs Assessment: post-procedure vital signs reviewed and stable Respiratory status: spontaneous breathing, nonlabored ventilation, respiratory function stable and patient connected to nasal cannula oxygen Cardiovascular status: blood pressure returned to baseline and stable Postop Assessment: no signs of nausea or vomiting Anesthetic complications: no    Last Vitals:  Vitals:   04/01/16 0900 04/01/16 0909  BP: 105/61 102/60  Pulse: 75 71  Resp: (!) 23 (!) 24  Temp:  36.4 C    Last Pain:  Vitals:   04/01/16 0909  TempSrc:   PainSc: 2                  Giuliana Handyside S

## 2016-04-01 NOTE — Anesthesia Preprocedure Evaluation (Signed)
Anesthesia Evaluation  Patient identified by MRN, date of birth, ID band Patient awake    Reviewed: Allergy & Precautions, NPO status , Patient's Chart, lab work & pertinent test results  Airway Mallampati: II  TM Distance: <3 FB Neck ROM: Full    Dental no notable dental hx.    Pulmonary neg pulmonary ROS,    Pulmonary exam normal breath sounds clear to auscultation       Cardiovascular hypertension, Normal cardiovascular exam Rhythm:Regular Rate:Normal     Neuro/Psych negative neurological ROS  negative psych ROS   GI/Hepatic negative GI ROS, Neg liver ROS,   Endo/Other  diabetesMorbid obesity  Renal/GU negative Renal ROS  negative genitourinary   Musculoskeletal negative musculoskeletal ROS (+)   Abdominal   Peds negative pediatric ROS (+)  Hematology negative hematology ROS (+)   Anesthesia Other Findings   Reproductive/Obstetrics negative OB ROS                             Anesthesia Physical Anesthesia Plan  ASA: III  Anesthesia Plan: General   Post-op Pain Management:    Induction: Intravenous  Airway Management Planned: LMA  Additional Equipment:   Intra-op Plan:   Post-operative Plan:   Informed Consent: I have reviewed the patients History and Physical, chart, labs and discussed the procedure including the risks, benefits and alternatives for the proposed anesthesia with the patient or authorized representative who has indicated his/her understanding and acceptance.   Dental advisory given  Plan Discussed with: CRNA and Surgeon  Anesthesia Plan Comments:         Anesthesia Quick Evaluation

## 2016-04-01 NOTE — Op Note (Signed)
Preoperative Diagnosis: RIGHT BREAST PAPILLOMA  Postoprative Diagnosis: RIGHT BREAST PAPILLOMA  Procedure: Procedure(s): RIGHT BREAST LUMPECTOMY WITH RADIOACTIVE SEED LOCALIZATION   Surgeon: Excell Seltzer T   Assistants: None  Anesthesia:  General LMA anesthesia  Indications: Patient is a 60 year old female who presents with right nipple discharge and imaging showing a small mass at the 9:00 position in the right breast. Large core biopsy has shown papilloma. We have recommended complete excision to rule out underlying malignancy. She is in agreement. We previously discussed the procedure and indications and risks detailed elsewhere.    Procedure Detail: Preoperatively the patient underwent accurate placement of a radioactive seed at the mass and marker site. Seed placement was confirmed in the holding area. She was taken to the operating room, placed in the supine position on the operating table and laryngeal mask general anesthesia induced.  She received preoperative IV antibiotics. PAS were placed. The right breast was widely sterilely prepped and draped. Patient timeout was performed and correct procedure verified. The seed was localized at the areolar border at the 9:00 position. A circumareolar incision was used and dissection carried down through the subcutaneous tissue. Using the neoprobe for guidance and approximately 1/2 cm globular specimen of breast tissue was excised around the seed. Excision was carried to the central ducts just behind the nipple. The specimen was removed and inked for margins. Specimen x-ray showed the seed and marking clip centrally located. This was sent for permanent pathology. Soft tissue was infiltrated with Marcaine. Complete hemostasis was obtained. Deep breast and simultaneous tissue was closed with interrupted 3-0 Vicryl and skin with subcuticular 5-0 Monocryl and Dermabond. Sponge needle and instrument counts were correct.    Findings: As  above  Estimated Blood Loss:  Minimal         Drains: None  Blood Given: none          Specimens: Right breast tissue        Complications:  * No complications entered in OR log *         Disposition: PACU - hemodynamically stable.         Condition: stable

## 2016-04-04 ENCOUNTER — Encounter (HOSPITAL_BASED_OUTPATIENT_CLINIC_OR_DEPARTMENT_OTHER): Payer: Self-pay | Admitting: General Surgery

## 2016-05-17 ENCOUNTER — Telehealth: Payer: Self-pay | Admitting: Family

## 2016-05-17 MED ORDER — CANAGLIFLOZIN 100 MG PO TABS: 100.0000 mg | ORAL_TABLET | Freq: Every day | ORAL | 1 refills | Status: DC

## 2016-05-17 NOTE — Telephone Encounter (Signed)
Please review and advise.

## 2016-05-17 NOTE — Telephone Encounter (Signed)
Invokana Prescription sent to pharmacy

## 2016-05-17 NOTE — Telephone Encounter (Signed)
Spoke with pt to inform of new RX Verbalizes understanding

## 2016-05-20 MED ORDER — METFORMIN HCL 1000 MG PO TABS: 1000.0000 mg | ORAL_TABLET | Freq: Two times a day (BID) | ORAL | 3 refills | Status: DC

## 2016-05-20 NOTE — Telephone Encounter (Signed)
Patient aware and verbalizes understanding. 

## 2016-05-20 NOTE — Telephone Encounter (Signed)
Metformin increased to 1000 mg BID. Take with large meal

## 2016-07-23 ENCOUNTER — Other Ambulatory Visit: Payer: Self-pay | Admitting: Family

## 2016-07-23 DIAGNOSIS — I10 Essential (primary) hypertension: Secondary | ICD-10-CM

## 2016-07-25 ENCOUNTER — Other Ambulatory Visit: Payer: Self-pay | Admitting: *Deleted

## 2016-07-25 DIAGNOSIS — I10 Essential (primary) hypertension: Secondary | ICD-10-CM

## 2016-07-25 MED ORDER — HYDROCHLOROTHIAZIDE 25 MG PO TABS: ORAL_TABLET | ORAL | 0 refills | Status: DC

## 2016-07-25 MED ORDER — RAMIPRIL 10 MG PO CAPS: ORAL_CAPSULE | ORAL | 0 refills | Status: DC

## 2016-07-28 ENCOUNTER — Ambulatory Visit: Payer: Commercial Managed Care - PPO | Admitting: Family

## 2016-07-29 ENCOUNTER — Ambulatory Visit: Payer: Commercial Managed Care - PPO | Admitting: Family

## 2016-08-05 ENCOUNTER — Encounter: Payer: Self-pay | Admitting: Family

## 2016-08-05 ENCOUNTER — Ambulatory Visit (INDEPENDENT_AMBULATORY_CARE_PROVIDER_SITE_OTHER): Payer: Commercial Managed Care - PPO | Admitting: Family

## 2016-08-05 VITALS — BP 126/80 | HR 75 | Temp 97.0°F | Ht 64.0 in | Wt 234.4 lb

## 2016-08-05 DIAGNOSIS — J301 Allergic rhinitis due to pollen: Secondary | ICD-10-CM | POA: Diagnosis not present

## 2016-08-05 DIAGNOSIS — E785 Hyperlipidemia, unspecified: Secondary | ICD-10-CM

## 2016-08-05 DIAGNOSIS — E119 Type 2 diabetes mellitus without complications: Secondary | ICD-10-CM

## 2016-08-05 DIAGNOSIS — I10 Essential (primary) hypertension: Secondary | ICD-10-CM

## 2016-08-05 LAB — BAYER DCA HB A1C WAIVED: HB A1C (BAYER DCA - WAIVED): 7.4 % — ABNORMAL HIGH (ref <7.0)

## 2016-08-05 MED ORDER — SEMAGLUTIDE (1 MG/DOSE) 2 MG/1.5ML ~~LOC~~ SOPN: 1.0000 mg | PEN_INJECTOR | SUBCUTANEOUS | 3 refills | Status: DC

## 2016-08-05 NOTE — Addendum Note (Signed)
Addended by: Evelina Dun A on: 08/05/2016 09:17 AM   Modules accepted: Orders

## 2016-08-05 NOTE — Progress Notes (Addendum)
Subjective:    Patient ID: Alexandria Tucker, female    DOB: 1955/05/11, 61 y.o.   MRN: [id number]  Pt presents to the office today for chronic follow up. P Hypertension  This is a chronic problem. The current episode started more than 1 year ago. The problem has been resolved since onset. The problem is controlled. Pertinent negatives include no anxiety, blurred vision, malaise/fatigue, palpitations or peripheral edema. Risk factors for coronary artery disease include dyslipidemia, obesity, post-menopausal state and family history. Past treatments include ACE inhibitors and diuretics. The current treatment provides significant improvement. There is no history of kidney disease, CAD/MI, CVA or heart failure. There is no history of sleep apnea or a thyroid problem.  Diabetes  She presents for her follow-up diabetic visit. She has type 2 diabetes mellitus. Her disease course has been stable. Pertinent negatives for hypoglycemia include no confusion, mood changes or pallor. Pertinent negatives for diabetes include no blurred vision, no foot paresthesias, no foot ulcerations and no visual change. Pertinent negatives for hypoglycemia complications include no blackouts and no hospitalization. Symptoms are stable. Pertinent negatives for diabetic complications include no CVA, heart disease, nephropathy or peripheral neuropathy. Risk factors for coronary artery disease include diabetes mellitus, dyslipidemia, family history, hypertension, obesity and post-menopausal. Current diabetic treatment includes oral agent (monotherapy). She is compliant with treatment all of the time. She is following a generally unhealthy diet. Her breakfast blood glucose range is generally >200 mg/dl. (Pt does not test blood sugars regularly  ) An ACE inhibitor/angiotensin II receptor blocker is being taken. Eye exam is current (May 2016).  Hyperlipidemia  This is a chronic problem. The current episode started more than 1 year ago. The  problem is uncontrolled. Recent lipid tests were reviewed and are normal. Exacerbating diseases include diabetes and obesity. She has no history of hypothyroidism. Factors aggravating her hyperlipidemia include fatty foods. Pertinent negatives include no leg pain. Current antihyperlipidemic treatment includes statins. The current treatment provides moderate improvement of lipids. Risk factors for coronary artery disease include diabetes mellitus, dyslipidemia, family history, hypertension, obesity and a sedentary lifestyle.      Review of Systems  Constitutional: Negative.  Negative for malaise/fatigue.  HENT: Negative.   Eyes: Negative.  Negative for blurred vision.  Cardiovascular: Negative.  Negative for palpitations.  Gastrointestinal: Negative.   Endocrine: Negative.   Genitourinary: Negative.   Musculoskeletal: Negative.   Skin: Negative for pallor.  Neurological: Negative.   Hematological: Negative.   Psychiatric/Behavioral: Negative.  Negative for confusion.  All other systems reviewed and are negative.      Objective:   Physical Exam  Constitutional: She is oriented to person, place, and time. She appears well-developed and well-nourished. No distress.  Morbid obesity   HENT:  Head: Normocephalic and atraumatic.  Right Ear: External ear normal.  Left Ear: External ear normal.  Nose: Nose normal.  Mouth/Throat: Oropharynx is clear and moist.  Eyes: Pupils are equal, round, and reactive to light.  Neck: Normal range of motion. Neck supple. No thyromegaly present.  Cardiovascular: Normal rate, regular rhythm, normal heart sounds and intact distal pulses.   No murmur heard. Pulmonary/Chest: Effort normal and breath sounds normal. No respiratory distress. She has no wheezes.  Abdominal: Soft. Bowel sounds are normal. She exhibits no distension. There is no tenderness.  Musculoskeletal: Normal range of motion. She exhibits no edema or tenderness.  Neurological: She is alert  and oriented to person, place, and time. She has normal reflexes. No cranial nerve deficit.  Skin: Skin is warm and dry.  Psychiatric: She has a normal mood and affect. Her behavior is normal. Judgment and thought content normal.  Vitals reviewed.    BP 126/80   Pulse 75   Temp 97 F (36.1 C) (Oral)   Ht '5\' 4"'  (1.626 m)   Wt 234 lb 6.4 oz (106.3 kg)   BMI 40.23 kg/m      Assessment & Plan:  1. Essential hypertension - CMP14+EGFR  2. Allergic rhinitis due to pollen, unspecified chronicity, unspecified seasonality - CMP14+EGFR  3. Hyperlipidemia with target LDL less than 100 - CMP14+EGFR - Lipid panel  4. Obesity, morbid, BMI 40.0-49.9 (HCC) - CMP14+EGFR  5. Type 2 diabetes mellitus without complication, without long-term current use of insulin (HCC) -Pt started on Ozempic today Two sample pens given to pt- Told take 0.25 mg Wilsey qwk X 4 weeks then increas to 0.5 mg qwk for 4 weeks then 1 mg/week - CMP14+EGFR - Bayer DCA Hb A1c Waived   Continue all meds Labs pending Health Maintenance reviewed Diet and exercise encouraged RTO 6 months   Evelina Dun, FNP

## 2016-08-05 NOTE — Patient Instructions (Signed)
Diabetes Mellitus and Food It is important for you to manage your blood sugar (glucose) level. Your blood glucose level can be greatly affected by what you eat. Eating healthier foods in the appropriate amounts throughout the day at about the same time each day will help you control your blood glucose level. It can also help slow or prevent worsening of your diabetes mellitus. Healthy eating may even help you improve the level of your blood pressure and reach or maintain a healthy weight. General recommendations for healthful eating and cooking habits include:  Eating meals and snacks regularly. Avoid going long periods of time without eating to lose weight.  Eating a diet that consists mainly of plant-based foods, such as fruits, vegetables, nuts, legumes, and whole grains.  Using low-heat cooking methods, such as baking, instead of high-heat cooking methods, such as deep frying.  Work with your dietitian to make sure you understand how to use the Nutrition Facts information on food labels. How can food affect me? Carbohydrates Carbohydrates affect your blood glucose level more than any other type of food. Your dietitian will help you determine how many carbohydrates to eat at each meal and teach you how to count carbohydrates. Counting carbohydrates is important to keep your blood glucose at a healthy level, especially if you are using insulin or taking certain medicines for diabetes mellitus. Alcohol Alcohol can cause sudden decreases in blood glucose (hypoglycemia), especially if you use insulin or take certain medicines for diabetes mellitus. Hypoglycemia can be a life-threatening condition. Symptoms of hypoglycemia (sleepiness, dizziness, and disorientation) are similar to symptoms of having too much alcohol. If your health care provider has given you approval to drink alcohol, do so in moderation and use the following guidelines:  Women should not have more than one drink per day, and men  should not have more than two drinks per day. One drink is equal to: ? 12 oz of beer. ? 5 oz of wine. ? 1 oz of hard liquor.  Do not drink on an empty stomach.  Keep yourself hydrated. Have water, diet soda, or unsweetened iced tea.  Regular soda, juice, and other mixers might contain a lot of carbohydrates and should be counted.  What foods are not recommended? As you make food choices, it is important to remember that all foods are not the same. Some foods have fewer nutrients per serving than other foods, even though they might have the same number of calories or carbohydrates. It is difficult to get your body what it needs when you eat foods with fewer nutrients. Examples of foods that you should avoid that are high in calories and carbohydrates but low in nutrients include:  Trans fats (most processed foods list trans fats on the Nutrition Facts label).  Regular soda.  Juice.  Candy.  Sweets, such as cake, pie, doughnuts, and cookies.  Fried foods.  What foods can I eat? Eat nutrient-rich foods, which will nourish your body and keep you healthy. The food you should eat also will depend on several factors, including:  The calories you need.  The medicines you take.  Your weight.  Your blood glucose level.  Your blood pressure level.  Your cholesterol level.  You should eat a variety of foods, including:  Protein. ? Lean cuts of meat. ? Proteins low in saturated fats, such as fish, egg whites, and beans. Avoid processed meats.  Fruits and vegetables. ? Fruits and vegetables that may help control blood glucose levels, such as apples,   mangoes, and yams.  Dairy products. ? Choose fat-free or low-fat dairy products, such as milk, yogurt, and cheese.  Grains, bread, pasta, and rice. ? Choose whole grain products, such as multigrain bread, whole oats, and brown rice. These foods may help control blood pressure.  Fats. ? Foods containing healthful fats, such as  nuts, avocado, olive oil, canola oil, and fish.  Does everyone with diabetes mellitus have the same meal plan? Because every person with diabetes mellitus is different, there is not one meal plan that works for everyone. It is very important that you meet with a dietitian who will help you create a meal plan that is just right for you. This information is not intended to replace advice given to you by your health care provider. Make sure you discuss any questions you have with your health care provider. Document Released: 01/13/2005 Document Revised: 09/24/2015 Document Reviewed: 03/15/2013 Elsevier Interactive Patient Education  2017 [employer]  

## 2016-08-06 LAB — CMP14+EGFR
ALBUMIN: 4.5 g/dL (ref 3.5–5.5)
ALT: 91 IU/L — AB (ref 0–32)
AST: 64 IU/L — ABNORMAL HIGH (ref 0–40)
Albumin/Globulin Ratio: 1.6 (ref 1.2–2.2)
Alkaline Phosphatase: 86 IU/L (ref 39–117)
BUN / CREAT RATIO: 24 — AB (ref 9–23)
BUN: 16 mg/dL (ref 6–24)
Bilirubin Total: 0.5 mg/dL (ref 0.0–1.2)
CALCIUM: 9.7 mg/dL (ref 8.7–10.2)
CHLORIDE: 98 mmol/L (ref 96–106)
CO2: 23 mmol/L (ref 18–29)
CREATININE: 0.68 mg/dL (ref 0.57–1.00)
GFR, EST AFRICAN AMERICAN: 111 mL/min/{1.73_m2} (ref >59)
GFR, EST NON AFRICAN AMERICAN: 96 mL/min/{1.73_m2} (ref >59)
Globulin, Total: 2.8 g/dL (ref 1.5–4.5)
Glucose: 166 mg/dL — ABNORMAL HIGH (ref 65–99)
POTASSIUM: 5.4 mmol/L — AB (ref 3.5–5.2)
SODIUM: 142 mmol/L (ref 134–144)
TOTAL PROTEIN: 7.3 g/dL (ref 6.0–8.5)

## 2016-08-06 LAB — LIPID PANEL
CHOL/HDL RATIO: 5.1 ratio — AB (ref 0.0–4.4)
Cholesterol, Total: 175 mg/dL (ref 100–199)
HDL: 34 mg/dL — ABNORMAL LOW (ref >39)
LDL Calculated: 108 mg/dL — ABNORMAL HIGH (ref 0–99)
Triglycerides: 167 mg/dL — ABNORMAL HIGH (ref 0–149)
VLDL Cholesterol Cal: 33 mg/dL (ref 5–40)

## 2016-08-08 ENCOUNTER — Other Ambulatory Visit: Payer: Self-pay | Admitting: Family

## 2016-08-08 DIAGNOSIS — E875 Hyperkalemia: Secondary | ICD-10-CM

## 2016-08-12 ENCOUNTER — Encounter: Payer: Self-pay | Admitting: Family

## 2016-08-16 ENCOUNTER — Telehealth: Payer: Self-pay

## 2016-08-17 ENCOUNTER — Other Ambulatory Visit: Payer: Self-pay | Admitting: Family

## 2016-08-17 DIAGNOSIS — I10 Essential (primary) hypertension: Secondary | ICD-10-CM

## 2016-08-17 MED ORDER — DULAGLUTIDE 0.75 MG/0.5ML ~~LOC~~ SOAJ: 0.7500 mg | SUBCUTANEOUS | 2 refills | Status: DC

## 2016-08-17 NOTE — Telephone Encounter (Signed)
Insurance denied Ozempic. Finish samples given and then start truilicity Prescription sent to pharmacy.

## 2016-08-17 NOTE — Telephone Encounter (Signed)
Patient aware and verbalizes understanding. Will let us know if she can not afford rx.

## 2016-09-20 ENCOUNTER — Encounter: Payer: Self-pay | Admitting: Family

## 2016-09-20 ENCOUNTER — Ambulatory Visit (INDEPENDENT_AMBULATORY_CARE_PROVIDER_SITE_OTHER): Payer: Commercial Managed Care - PPO | Admitting: Family

## 2016-09-20 VITALS — BP 121/78 | HR 81 | Temp 97.6°F | Ht 64.0 in | Wt 234.0 lb

## 2016-09-20 DIAGNOSIS — R319 Hematuria, unspecified: Secondary | ICD-10-CM | POA: Diagnosis not present

## 2016-09-20 DIAGNOSIS — R399 Unspecified symptoms and signs involving the genitourinary system: Secondary | ICD-10-CM

## 2016-09-20 LAB — MICROSCOPIC EXAMINATION
BACTERIA UA: NONE SEEN
RBC, UA: >30 /hpf — AB (ref 0–?)
Renal Epithel, UA: NONE SEEN /hpf

## 2016-09-20 LAB — URINALYSIS, COMPLETE
Bilirubin, UA: NEGATIVE
LEUKOCYTES UA: NEGATIVE
NITRITE UA: NEGATIVE
Specific Gravity, UA: 1.025 (ref 1.005–1.030)
UUROB: 1 mg/dL (ref 0.2–1.0)
pH, UA: 7 (ref 5.0–7.5)

## 2016-09-20 MED ORDER — CIPROFLOXACIN HCL 500 MG PO TABS: 500.0000 mg | ORAL_TABLET | Freq: Two times a day (BID) | ORAL | 0 refills | Status: DC

## 2016-09-20 NOTE — Progress Notes (Signed)
   Subjective:    Patient ID: Alexandria Tucker, female    DOB: [birth year], 61 y.o.   MRN: [id number]  Hematuria  This is a new problem. The current episode started yesterday. The problem has been gradually worsening since onset. She describes the hematuria as gross hematuria. She is experiencing no pain. Irritative symptoms include urgency. Irritative symptoms do not include frequency. Associated symptoms include flank pain. Pertinent negatives include no dysuria, nausea or vomiting.      Review of Systems  Gastrointestinal: Negative for nausea and vomiting.  Genitourinary: Positive for flank pain, hematuria and urgency. Negative for dysuria and frequency.  All other systems reviewed and are negative.      Objective:   Physical Exam  Constitutional: She is oriented to person, place, and time. She appears well-developed and well-nourished. No distress.  HENT:  Head: Normocephalic.  Eyes: Pupils are equal, round, and reactive to light.  Neck: Normal range of motion. Neck supple. No thyromegaly present.  Cardiovascular: Normal rate, regular rhythm, normal heart sounds and intact distal pulses.   No murmur heard. Pulmonary/Chest: Effort normal and breath sounds normal. No respiratory distress. She has no wheezes.  Abdominal: Soft. Bowel sounds are normal. She exhibits no distension. There is no tenderness.  Musculoskeletal: Normal range of motion. She exhibits no edema or tenderness.  Neurological: She is alert and oriented to person, place, and time.  Skin: Skin is warm and dry.  Psychiatric: She has a normal mood and affect. Her behavior is normal. Judgment and thought content normal.  Vitals reviewed.     BP 121/78   Pulse 81   Temp 97.6 F (36.4 C) (Oral)   Ht 5\' 4"  (1.626 m)   Wt 234 lb (106.1 kg)   BMI 40.17 kg/m      Assessment & Plan:  1. Hematuria, unspecified type - Urinalysis, Complete - Urine culture  2. UTI symptoms Force fluids AZO over the counter X2  days RTO prn Culture pending - Urine culture - ciprofloxacin (CIPRO) 500 MG tablet; Take 1 tablet (500 mg total) by mouth 2 (two) times daily.  Dispense: 14 tablet; Refill: 0  Culture pending- Will treat as UTI, but worrisome for stone? Force fluids RTO prn   Evelina Dun, FNP

## 2016-09-20 NOTE — Patient Instructions (Signed)

## 2016-09-22 LAB — URINE CULTURE

## 2016-10-15 ENCOUNTER — Ambulatory Visit (INDEPENDENT_AMBULATORY_CARE_PROVIDER_SITE_OTHER): Payer: Commercial Managed Care - PPO | Admitting: Family Medicine

## 2016-10-15 VITALS — BP 126/69 | HR 91 | Temp 97.3°F | Ht 64.0 in | Wt 230.0 lb

## 2016-10-15 DIAGNOSIS — R300 Dysuria: Secondary | ICD-10-CM

## 2016-10-15 DIAGNOSIS — N3090 Cystitis, unspecified without hematuria: Secondary | ICD-10-CM

## 2016-10-15 DIAGNOSIS — R310 Gross hematuria: Secondary | ICD-10-CM

## 2016-10-15 MED ORDER — CIPROFLOXACIN HCL 500 MG PO TABS: 500.0000 mg | ORAL_TABLET | Freq: Two times a day (BID) | ORAL | 0 refills | Status: DC

## 2016-10-15 NOTE — Progress Notes (Signed)
Chief Complaint  Patient presents with  . Urinary Tract Infection    some blood, lower abd and back discomfort    HPI  Patient presents today for Dark urine. She has not had burning with urination or frequency this time. She did have some blood in her urine with infection symptoms 2-3 weeks ago. Culture at that time showed mixed flora only. Took a course of Cipro and got better until 3 days ago she noticed her urine getting dark again. She's concerned about blood being present. Denies fever . No flank pain. No nausea, vomiting.   PMH: Smoking status noted ROS: Per HPI  Objective: BP 126/69 (BP Location: Right Arm)   Pulse 91   Temp 97.3 F (36.3 C) (Oral)   Ht 5\' 4"  (1.626 m)   Wt 230 lb (104.3 kg)   BMI 39.48 kg/m  Gen: NAD, alert, cooperative with exam HEENT: NCAT, EOMI, PERRL CV: RRR, good S1/S2, no murmur Resp: CTABL, no wheezes, non-labored Abd: SNTND, BS present, no guarding or organomegaly. Flanks nontender. Ext: No edema, warm Neuro: Alert and oriented, No gross deficits  Assessment and plan:  1. Dysuria   2. Cystitis   3. Gross hematuria     Meds ordered this encounter  Medications  . ciprofloxacin (CIPRO) 500 MG tablet    Sig: Take 1 tablet (500 mg total) by mouth 2 (two) times daily.    Dispense:  14 tablet    Refill:  0    Orders Placed This Encounter  Procedures  . Urine Culture  . Urinalysis    Follow up With Ms. Hawks for hematuria follow-up in about 9 days Claretta Fraise, MD

## 2016-10-16 LAB — URINE CULTURE

## 2016-10-17 LAB — URINALYSIS
BILIRUBIN UA: NEGATIVE
Glucose, UA: NEGATIVE
Ketones, UA: NEGATIVE
Nitrite, UA: NEGATIVE
PH UA: 5.5 (ref 5.0–7.5)
Specific Gravity, UA: 1.025 (ref 1.005–1.030)
UUROB: 0.2 mg/dL (ref 0.2–1.0)

## 2016-10-25 ENCOUNTER — Ambulatory Visit (INDEPENDENT_AMBULATORY_CARE_PROVIDER_SITE_OTHER): Payer: Commercial Managed Care - PPO | Admitting: Family

## 2016-10-25 ENCOUNTER — Encounter: Payer: Self-pay | Admitting: Family

## 2016-10-25 VITALS — BP 123/78 | HR 81 | Temp 97.3°F | Ht 64.0 in | Wt 229.2 lb

## 2016-10-25 DIAGNOSIS — R3989 Other symptoms and signs involving the genitourinary system: Secondary | ICD-10-CM

## 2016-10-25 LAB — MICROSCOPIC EXAMINATION: Renal Epithel, UA: NONE SEEN /hpf

## 2016-10-25 LAB — URINALYSIS, COMPLETE
Bilirubin, UA: NEGATIVE
GLUCOSE, UA: NEGATIVE
KETONES UA: NEGATIVE
LEUKOCYTES UA: NEGATIVE
Nitrite, UA: NEGATIVE
Protein, UA: NEGATIVE
SPEC GRAV UA: 1.015 (ref 1.005–1.030)
Urobilinogen, Ur: 1 mg/dL (ref 0.2–1.0)
pH, UA: 7 (ref 5.0–7.5)

## 2016-10-25 NOTE — Patient Instructions (Signed)

## 2016-10-25 NOTE — Progress Notes (Signed)
   Subjective:    Patient ID: Alexandria Tucker, female    DOB: 06-04-1955, 61 y.o.   MRN: [id number]  HPI PT presents to the office today to recheck UTI. PT was diagnosed with a UTI on 09/20/16 and was started on cipro 500 mg BID for 7 days pt then had recurrent symptoms and was diagnosed with a UTI on 10/15/16 and treated again with cipro 500 mg BID for 7days. PT reports her symptoms have resolved and is doing much better.   PT's culture was negative.    Review of Systems  All other systems reviewed and are negative.      Objective:   Physical Exam  Constitutional: She is oriented to person, place, and time. She appears well-developed and well-nourished. No distress.  HENT:  Head: Normocephalic and atraumatic.  Right Ear: External ear normal.  Left Ear: External ear normal.  Nose: Nose normal.  Mouth/Throat: Oropharynx is clear and moist.  Eyes: Pupils are equal, round, and reactive to light.  Neck: Normal range of motion. Neck supple. No thyromegaly present.  Cardiovascular: Normal rate, regular rhythm, normal heart sounds and intact distal pulses.   No murmur heard. Pulmonary/Chest: Effort normal and breath sounds normal. No respiratory distress. She has no wheezes.  Abdominal: Soft. Bowel sounds are normal. She exhibits no distension. There is no tenderness.  Musculoskeletal: Normal range of motion. She exhibits no edema or tenderness.  Neurological: She is alert and oriented to person, place, and time.  Skin: Skin is warm and dry.  Psychiatric: She has a normal mood and affect. Her behavior is normal. Judgment and thought content normal.  Vitals reviewed.   BP 123/78   Pulse 81   Temp 97.3 F (36.3 C) (Oral)   Ht 5\' 4"  (1.626 m)   Wt 229 lb 3.2 oz (104 kg)   BMI 39.34 kg/m       Assessment & Plan:  1. Urine troubles Resolved  Force fluids AZO over the counter X2 days RTO prn - Urinalysis, Complete   Blood in urine- Possible stone, forcing fluids, RTO if  increased pain or trouble urinating    Evelina Dun, FNP

## 2016-10-26 ENCOUNTER — Other Ambulatory Visit: Payer: Commercial Managed Care - PPO

## 2016-10-26 ENCOUNTER — Telehealth: Payer: Self-pay | Admitting: Family

## 2016-10-26 DIAGNOSIS — R3989 Other symptoms and signs involving the genitourinary system: Secondary | ICD-10-CM

## 2016-10-26 NOTE — Telephone Encounter (Signed)
She left a specimen for the urine culture.  Her lower back is paining her today. What do you suggest?

## 2016-10-26 NOTE — Addendum Note (Signed)
Addended by: Shelbie Ammons on: 10/26/2016 10:20 AM   Modules accepted: Orders

## 2016-10-27 LAB — URINE CULTURE

## 2016-10-27 MED ORDER — NITROFURANTOIN MONOHYD MACRO 100 MG PO CAPS: 100.0000 mg | ORAL_CAPSULE | Freq: Two times a day (BID) | ORAL | 0 refills | Status: DC

## 2016-10-27 NOTE — Telephone Encounter (Signed)
Aware. 

## 2016-10-27 NOTE — Telephone Encounter (Signed)
Macrobid Prescription sent to pharmacy, while culture is pending

## 2016-10-27 NOTE — Telephone Encounter (Signed)
Script is ready.  Left message for patient to call us .

## 2016-10-28 ENCOUNTER — Other Ambulatory Visit: Payer: Self-pay | Admitting: Family

## 2016-10-28 DIAGNOSIS — R319 Hematuria, unspecified: Secondary | ICD-10-CM

## 2016-11-18 ENCOUNTER — Telehealth: Payer: Self-pay | Admitting: Family

## 2016-11-18 NOTE — Telephone Encounter (Signed)
[facility] to x-ray 

## 2016-11-18 NOTE — Telephone Encounter (Signed)
We are checking on the referral for patient. Please advise on pain medications

## 2016-11-21 ENCOUNTER — Emergency Department (HOSPITAL_COMMUNITY): Payer: Commercial Managed Care - PPO

## 2016-11-21 ENCOUNTER — Emergency Department (HOSPITAL_COMMUNITY)
Admission: EM | Admit: 2016-11-21 | Discharge: 2016-11-21 | Disposition: A | Discharge status: Home / Self Care | Payer: Commercial Managed Care - PPO | Attending: Emergency Medicine | Admitting: Emergency Medicine

## 2016-11-21 ENCOUNTER — Encounter (HOSPITAL_COMMUNITY): Payer: Self-pay | Admitting: *Deleted

## 2016-11-21 DIAGNOSIS — Z7984 Long term (current) use of oral hypoglycemic drugs: Secondary | ICD-10-CM | POA: Diagnosis not present

## 2016-11-21 DIAGNOSIS — N201 Calculus of ureter: Secondary | ICD-10-CM

## 2016-11-21 DIAGNOSIS — Z7982 Long term (current) use of aspirin: Secondary | ICD-10-CM | POA: Diagnosis not present

## 2016-11-21 DIAGNOSIS — I10 Essential (primary) hypertension: Secondary | ICD-10-CM | POA: Diagnosis not present

## 2016-11-21 DIAGNOSIS — E1129 Type 2 diabetes mellitus with other diabetic kidney complication: Secondary | ICD-10-CM | POA: Insufficient documentation

## 2016-11-21 DIAGNOSIS — R1010 Upper abdominal pain, unspecified: Secondary | ICD-10-CM | POA: Diagnosis present

## 2016-11-21 DIAGNOSIS — Z79899 Other long term (current) drug therapy: Secondary | ICD-10-CM | POA: Diagnosis not present

## 2016-11-21 LAB — CBC WITH DIFFERENTIAL/PLATELET
BASOS ABS: 0.1 10*3/uL (ref 0.0–0.1)
BASOS PCT: 0 %
EOS ABS: 0.4 10*3/uL (ref 0.0–0.7)
Eosinophils Relative: 3 %
HEMATOCRIT: 39.9 % (ref 36.0–46.0)
HEMOGLOBIN: 13.6 g/dL (ref 12.0–15.0)
Lymphocytes Relative: 18 %
Lymphs Abs: 2.1 10*3/uL (ref 0.7–4.0)
MCH: 30 pg (ref 26.0–34.0)
MCHC: 34.1 g/dL (ref 30.0–36.0)
MCV: 87.9 fL (ref 78.0–100.0)
MONO ABS: 0.7 10*3/uL (ref 0.1–1.0)
MONOS PCT: 6 %
NEUTROS ABS: 8.5 10*3/uL — AB (ref 1.7–7.7)
NEUTROS PCT: 73 %
Platelets: 327 10*3/uL (ref 150–400)
RBC: 4.54 MIL/uL (ref 3.87–5.11)
RDW: 13.6 % (ref 11.5–15.5)
WBC: 11.7 10*3/uL — ABNORMAL HIGH (ref 4.0–10.5)

## 2016-11-21 LAB — URINALYSIS, ROUTINE W REFLEX MICROSCOPIC
BILIRUBIN URINE: NEGATIVE
GLUCOSE, UA: NEGATIVE mg/dL
Ketones, ur: NEGATIVE mg/dL
Nitrite: NEGATIVE
PH: 7 (ref 5.0–8.0)
Protein, ur: 30 mg/dL — AB
SPECIFIC GRAVITY, URINE: 1.009 (ref 1.005–1.030)

## 2016-11-21 LAB — BASIC METABOLIC PANEL
ANION GAP: 11 (ref 5–15)
BUN: 18 mg/dL (ref 6–20)
CALCIUM: 10.1 mg/dL (ref 8.9–10.3)
CO2: 27 mmol/L (ref 22–32)
CREATININE: 0.8 mg/dL (ref 0.44–1.00)
Chloride: 101 mmol/L (ref 101–111)
Glucose, Bld: 209 mg/dL — ABNORMAL HIGH (ref 65–99)
Potassium: 3.2 mmol/L — ABNORMAL LOW (ref 3.5–5.1)
Sodium: 139 mmol/L (ref 135–145)

## 2016-11-21 MED ORDER — HYDROCODONE-ACETAMINOPHEN 5-325 MG PO TABS: 1.0000 | ORAL_TABLET | ORAL | 0 refills | Status: DC | PRN

## 2016-11-21 MED ORDER — MORPHINE SULFATE (PF) 4 MG/ML IV SOLN
4.0000 mg | Freq: Once | INTRAVENOUS | Status: AC
  Administered 2016-11-21: 4 mg via INTRAVENOUS
  Filled 2016-11-21: qty 1

## 2016-11-21 MED ORDER — POTASSIUM CHLORIDE CRYS ER 20 MEQ PO TBCR
40.0000 meq | EXTENDED_RELEASE_TABLET | Freq: Once | ORAL | Status: AC
  Administered 2016-11-21: 40 meq via ORAL
  Filled 2016-11-21: qty 2

## 2016-11-21 MED ORDER — ONDANSETRON HCL 4 MG/2ML IJ SOLN
4.0000 mg | Freq: Once | INTRAMUSCULAR | Status: AC
  Administered 2016-11-21: 4 mg via INTRAVENOUS
  Filled 2016-11-21: qty 2

## 2016-11-21 NOTE — ED Notes (Signed)
Pt back from CT

## 2016-11-21 NOTE — ED Provider Notes (Signed)
[facility] DEPT Provider Note   CSN: [id number] Arrival date & time: 11/21/16  0152     History   Chief Complaint Chief Complaint  Patient presents with  . Flank Pain    HPI Alexandria Tucker is a 61 y.o. female.  HPI  61 year old female with back pain and abdominal/vaginal pressure. She has been having on and off hematuria for over one month. Last antibiotics were last month. She is due to be followed up by a urologist but has not seen them recently. She had kidney stones about 7 years ago and states this feels similar. Her PCP is trying to order a CT scan but has not been approved yet. She's been having worsening bilateral back pain for the last few days. However it's worse tonight since around 11:30 PM. Also having abdominal/pelvic pressure. No vaginal bleeding. Had hematuria a few days ago but no further. Urine is decreased and she's having urgency and frequency. No fevers. Is nauseated since arrival to the ED but she thinks that it's from being in a medical facility. Pain is severe. Has tried ibuprofen with no relief. Pain seems to come and go.  Past Medical History:  Diagnosis Date  . Breast mass, right   . Diabetes mellitus without complication ([place])   . Hyperlipidemia   . Hypertension     Patient Active Problem List   Diagnosis Date Noted  . Obesity, morbid, BMI 40.0-49.9 ([place]) 01/22/2016  . Allergic rhinitis 01/02/2015  . Diabetes mellitus ([place]) 07/04/2014  . Hyperlipidemia with target LDL less than 100 06/21/2013  . Hypertension 06/21/2013    Past Surgical History:  Procedure Laterality Date  . ABDOMINAL HYSTERECTOMY    . BREAST LUMPECTOMY WITH RADIOACTIVE SEED LOCALIZATION Right 04/01/2016   Procedure: RIGHT BREAST LUMPECTOMY WITH RADIOACTIVE SEED LOCALIZATION;  Surgeon: Excell Seltzer, MD;  Location: [facility];  Service: General;  Laterality: Right;  . CESAREAN SECTION    . Kidney stones    . TUBAL LIGATION      OB History    No data  available       Home Medications    Prior to Admission medications   Medication Sig Start Date End Date Taking? Authorizing Provider  aspirin EC 81 MG tablet Take 81 mg by mouth daily.    [provider]  Dulaglutide (TRULICITY) 4.58 KD/9.8PJ SOPN Inject 0.75 mg into the skin once a week. 08/17/16   Sharion Balloon, FNP  fexofenadine (ALLEGRA) 180 MG tablet Take 1 tablet (180 mg total) by mouth daily. 01/27/16   Sharion Balloon, FNP  fluticasone (FLONASE) 50 MCG/ACT nasal spray One to 2 sprays each nostril at bedtime 01/27/16   Evelina Dun A, FNP  hydrochlorothiazide (HYDRODIURIL) 25 MG tablet TAKE 1 TABLET BY MOUTH ONCE DAILY 08/18/16   Evelina Dun A, FNP  HYDROcodone-acetaminophen (NORCO/VICODIN) 5-325 MG tablet Take 1-2 tablets by mouth every 4 (four) hours as needed for severe pain. 11/21/16   Sherwood Gambler, MD  metFORMIN (GLUCOPHAGE) 1000 MG tablet Take 1 tablet (1,000 mg total) by mouth 2 (two) times daily with a meal. 05/20/16   Hawks, Theador Hawthorne, FNP  nitrofurantoin, macrocrystal-monohydrate, (MACROBID) 100 MG capsule Take 1 capsule (100 mg total) by mouth 2 (two) times daily. 10/27/16   Evelina Dun A, FNP  ramipril (ALTACE) 10 MG capsule TAKE 1 CAPSULE BY MOUTH ONCE DAILY **NEEDS  TO  BE  SEEN  FOR  FURTHER  REFILLS** 08/18/16   Sharion Balloon, FNP  simvastatin (  ZOCOR) 20 MG tablet Take 1 tablet (20 mg total) by mouth at bedtime. 01/27/16   Sharion Balloon, FNP    Family History Family History  Problem Relation Age of Onset  . Hypertension Mother   . Heart disease Father   . Heart disease Brother   . Hypertension Brother   . Hypertension Brother     Social History Social History  Substance Use Topics  . Smoking status: Never Smoker  . Smokeless tobacco: Never Used  . Alcohol use 0.0 oz/week     Comment: social     Allergies   Atorvastatin; Augmentin [amoxicillin-pot clavulanate]; and Niaspan [niacin er]   Review of Systems Review of Systems    Constitutional: Negative for fever.  Gastrointestinal: Positive for abdominal pain and nausea. Negative for vomiting.  Genitourinary: Positive for decreased urine volume, frequency, hematuria and urgency. Negative for dysuria.  Musculoskeletal: Positive for back pain.  All other systems reviewed and are negative.    Physical Exam Updated Vital Signs BP (!) 154/84 (BP Location: Right Arm)   Pulse 90   Temp 97.9 F (36.6 C) (Oral)   Resp 18   Ht 5\' 4"  (1.626 m)   Wt 102.5 kg (226 lb)   SpO2 100%   BMI 38.79 kg/m   Physical Exam  Constitutional: She is oriented to person, place, and time. She appears well-developed and well-nourished. No distress.  HENT:  Head: Normocephalic and atraumatic.  Right Ear: External ear normal.  Left Ear: External ear normal.  Nose: Nose normal.  Eyes: Right eye exhibits no discharge. Left eye exhibits no discharge.  Cardiovascular: Normal rate, regular rhythm and normal heart sounds.   Pulmonary/Chest: Effort normal and breath sounds normal.  Abdominal: Soft. She exhibits no distension. There is no tenderness. There is CVA tenderness (mild, bilateral).  Neurological: She is alert and oriented to person, place, and time.  Skin: Skin is warm and dry. She is not diaphoretic.  Nursing note and vitals reviewed.    ED Treatments / Results  Labs (all labs ordered are listed, but only abnormal results are displayed) Labs Reviewed  BASIC METABOLIC PANEL - Abnormal; Notable for the following:       Result Value   Potassium 3.2 (*)    Glucose, Bld 209 (*)    All other components within normal limits  CBC WITH DIFFERENTIAL/PLATELET - Abnormal; Notable for the following:    WBC 11.7 (*)    Neutro Abs 8.5 (*)    All other components within normal limits  URINALYSIS, ROUTINE W REFLEX MICROSCOPIC - Abnormal; Notable for the following:    Hgb urine dipstick LARGE (*)    Protein, ur 30 (*)    Leukocytes, UA TRACE (*)    Bacteria, UA RARE (*)     Squamous Epithelial / LPF 0-5 (*)    All other components within normal limits    EKG  EKG Interpretation None       Radiology Ct Renal Stone Study  Result Date: 11/21/2016 CLINICAL DATA:  Awoke with flank pain. Hematuria. Urinary pressure. EXAM: CT ABDOMEN AND PELVIS WITHOUT CONTRAST TECHNIQUE: Multidetector CT imaging of the abdomen and pelvis was performed following the standard protocol without IV contrast. COMPARISON:  CT 01/09/2011 FINDINGS: Lower chest: The lung bases are clear. There are coronary artery calcifications. Hepatobiliary: The liver is enlarged with steatosis. Focal fatty sparing adjacent gallbladder fossa. Calcified gallstones within physiologically distended gallbladder. No pericholecystic fluid or biliary dilatation. Pancreas: No ductal dilatation or inflammation. Spleen:  Normal in size without focal abnormality. Adrenals/Urinary Tract: Normal adrenal glands. Obstructing 6 x 6 mm stone at the right ureterovesicular junction with mild to moderate hydroureteronephrosis. Mild right perinephric edema. No additional nonobstructing stone in the right kidney. Punctate nonobstructing left nephrolithiasis. No left hydronephrosis. Probable parapelvic cysts in the lower left kidney. Small exophytic cyst from the upper left kidney. Urinary bladder is minimally distended, no bladder stone. Stomach/Bowel: Sigmoid colonic diverticulosis without inflammation. Moderate stool burden in the more proximal colon. Normal appendix. No small bowel dilatation, obstruction or wall thickening. Small hiatal hernia. Stomach physiologically distended. Vascular/Lymphatic: Aortic atherosclerosis. No abdominal or pelvic adenopathy. Reproductive: Post hysterectomy. Ovaries appear normal. No adnexal mass. Other: No free air, free fluid, or intra-abdominal fluid collection. Musculoskeletal: Degenerative change in the lumbar spine with degenerative disc disease and facet arthropathy. Peripherally calcified disc  osteophyte complex that T12-L1 causes mass effect on the spinal canal. IMPRESSION: 1. Obstructing 6 mm stone at the right ureterovesicular junction with hydronephrosis. 2. New punctate nonobstructing left nephrolithiasis. 3. Hepatomegaly and hepatic steatosis. Gallstones without gallbladder inflammation. 4. Colonic diverticulosis.  Aortic atherosclerosis. 5. Peripherally calcified disc osteophyte complex at T12-L1 causes mass effect on the spinal canal. Electronically Signed   By: Jeb Levering M.D.   On: 11/21/2016 03:03    Procedures Procedures (including critical care time)  Medications Ordered in ED Medications  potassium chloride SA (K-DUR,KLOR-CON) CR tablet 40 mEq (not administered)  morphine 4 MG/ML injection 4 mg (4 mg Intravenous Given 11/21/16 0237)  ondansetron (ZOFRAN) injection 4 mg (4 mg Intravenous Given 11/21/16 0237)     Initial Impression / Assessment and Plan / ED Course  I have reviewed the triage vital signs and the nursing notes.  Pertinent labs & imaging results that were available during my care of the patient were reviewed by me and considered in my medical decision making (see chart for details).     Patient's pain is much better after IV morphine. She has not had any dysuria and I believe her urinary symptoms are more from the ureteral stone rather than UTI. Given pain is controlled, will discharge home with continued NSAIDs and add on hydrocodone for breakthrough pain. Send urine for culture but I don't think this is an infection. Follow-up with urology. She will be given a strainer. Drink plenty of fluids. Discussed return precautions.  Final Clinical Impressions(s) / ED Diagnoses   Final diagnoses:  Right ureteral stone    New Prescriptions New Prescriptions   HYDROCODONE-ACETAMINOPHEN (NORCO/VICODIN) 5-325 MG TABLET    Take 1-2 tablets by mouth every 4 (four) hours as needed for severe pain.     Sherwood Gambler, MD 11/21/16 [phone]

## 2016-11-21 NOTE — Telephone Encounter (Signed)
Patient was seen at ED today(11-21-2016), and diagnosis was kidney Alexandria Tucker.

## 2016-11-21 NOTE — ED Triage Notes (Signed)
Pt has a hx of kidney stones and states she had lower back pain that woke her up earlier this evening; pt states she has blood in her urine and pressure to her lower abdomen and vaginal area

## 2016-11-21 NOTE — ED Notes (Signed)
Pt alert & oriented x4, stable gait. Patient given discharge instructions, paperwork & prescription(s). Patient informed not to drive, operate any equipment & handel any important documents 4 hours after taking pain medication. Patient  instructed to stop at the registration desk to finish any additional paperwork. Patient  verbalized understanding. Pt left department w/ no further questions. 

## 2016-11-23 ENCOUNTER — Encounter (HOSPITAL_COMMUNITY): Payer: Self-pay | Admitting: General Practice

## 2016-11-23 ENCOUNTER — Other Ambulatory Visit: Payer: Self-pay | Admitting: Urology

## 2016-11-23 ENCOUNTER — Ambulatory Visit (HOSPITAL_COMMUNITY)
Admission: RE | Admit: 2016-11-23 | Discharge: 2016-11-23 | Disposition: A | Discharge status: Home / Self Care | Payer: Commercial Managed Care - PPO | Source: Ambulatory Visit | Attending: Urology | Admitting: Urology

## 2016-11-23 ENCOUNTER — Ambulatory Visit (HOSPITAL_COMMUNITY): Payer: Commercial Managed Care - PPO | Admitting: Certified Registered Nurse Anesthetist

## 2016-11-23 ENCOUNTER — Ambulatory Visit (HOSPITAL_COMMUNITY): Payer: Commercial Managed Care - PPO

## 2016-11-23 ENCOUNTER — Encounter (HOSPITAL_COMMUNITY)
Admission: RE | Disposition: A | Discharge status: Home / Self Care | Payer: Self-pay | Source: Ambulatory Visit | Attending: Urology

## 2016-11-23 DIAGNOSIS — Z6838 Body mass index (BMI) 38.0-38.9, adult: Secondary | ICD-10-CM | POA: Diagnosis not present

## 2016-11-23 DIAGNOSIS — E7800 Pure hypercholesterolemia, unspecified: Secondary | ICD-10-CM | POA: Insufficient documentation

## 2016-11-23 DIAGNOSIS — M1990 Unspecified osteoarthritis, unspecified site: Secondary | ICD-10-CM | POA: Diagnosis not present

## 2016-11-23 DIAGNOSIS — Z7984 Long term (current) use of oral hypoglycemic drugs: Secondary | ICD-10-CM | POA: Diagnosis not present

## 2016-11-23 DIAGNOSIS — N201 Calculus of ureter: Secondary | ICD-10-CM | POA: Diagnosis present

## 2016-11-23 DIAGNOSIS — I10 Essential (primary) hypertension: Secondary | ICD-10-CM | POA: Insufficient documentation

## 2016-11-23 DIAGNOSIS — E119 Type 2 diabetes mellitus without complications: Secondary | ICD-10-CM | POA: Diagnosis not present

## 2016-11-23 DIAGNOSIS — Z79899 Other long term (current) drug therapy: Secondary | ICD-10-CM | POA: Insufficient documentation

## 2016-11-23 HISTORY — PX: CYSTOSCOPY/RETROGRADE/URETEROSCOPY: SHX5316

## 2016-11-23 HISTORY — DX: Personal history of urinary calculi: Z87.442

## 2016-11-23 LAB — HEMOGLOBIN: HEMOGLOBIN: 13.2 g/dL (ref 12.0–15.0)

## 2016-11-23 LAB — BASIC METABOLIC PANEL
ANION GAP: 11 (ref 5–15)
BUN: 19 mg/dL (ref 6–20)
CHLORIDE: 101 mmol/L (ref 101–111)
CO2: 28 mmol/L (ref 22–32)
Calcium: 9.9 mg/dL (ref 8.9–10.3)
Creatinine, Ser: 0.65 mg/dL (ref 0.44–1.00)
Glucose, Bld: 104 mg/dL — ABNORMAL HIGH (ref 65–99)
POTASSIUM: 3.5 mmol/L (ref 3.5–5.1)
SODIUM: 140 mmol/L (ref 135–145)

## 2016-11-23 LAB — GLUCOSE, CAPILLARY
GLUCOSE-CAPILLARY: 106 mg/dL — AB (ref 65–99)
GLUCOSE-CAPILLARY: 106 mg/dL — AB (ref 65–99)

## 2016-11-23 SURGERY — CYSTOSCOPY/RETROGRADE/URETEROSCOPY
Anesthesia: General | Laterality: Right

## 2016-11-23 MED ORDER — PROPOFOL 10 MG/ML IV BOLUS
INTRAVENOUS | Status: DC | PRN
  Administered 2016-11-23: 200 mg via INTRAVENOUS

## 2016-11-23 MED ORDER — IOHEXOL 300 MG/ML  SOLN
INTRAMUSCULAR | Status: DC | PRN
  Administered 2016-11-23: 1 mL

## 2016-11-23 MED ORDER — ONDANSETRON HCL 4 MG/2ML IJ SOLN
INTRAMUSCULAR | Status: AC
  Filled 2016-11-23: qty 2

## 2016-11-23 MED ORDER — PROMETHAZINE HCL 25 MG/ML IJ SOLN: 6.2500 mg | INTRAMUSCULAR | Status: DC | PRN

## 2016-11-23 MED ORDER — FENTANYL CITRATE (PF) 100 MCG/2ML IJ SOLN
INTRAMUSCULAR | Status: AC
  Filled 2016-11-23: qty 2

## 2016-11-23 MED ORDER — MIDAZOLAM HCL 2 MG/2ML IJ SOLN
INTRAMUSCULAR | Status: AC
  Filled 2016-11-23: qty 2

## 2016-11-23 MED ORDER — MIDAZOLAM HCL 2 MG/2ML IJ SOLN: 0.5000 mg | Freq: Once | INTRAMUSCULAR | Status: DC | PRN

## 2016-11-23 MED ORDER — HYDROCODONE-ACETAMINOPHEN 5-325 MG PO TABS: 1.0000 | ORAL_TABLET | Freq: Four times a day (QID) | ORAL | 0 refills | Status: DC | PRN

## 2016-11-23 MED ORDER — MIDAZOLAM HCL 5 MG/5ML IJ SOLN
INTRAMUSCULAR | Status: DC | PRN
  Administered 2016-11-23: 2 mg via INTRAVENOUS

## 2016-11-23 MED ORDER — SODIUM CHLORIDE 0.9 % IR SOLN
Status: DC | PRN
  Administered 2016-11-23: 4000 mL

## 2016-11-23 MED ORDER — ONDANSETRON HCL 4 MG/2ML IJ SOLN
INTRAMUSCULAR | Status: DC | PRN
  Administered 2016-11-23: 4 mg via INTRAVENOUS

## 2016-11-23 MED ORDER — LACTATED RINGERS IV SOLN
INTRAVENOUS | Status: DC
  Administered 2016-11-23: 16:00:00 via INTRAVENOUS

## 2016-11-23 MED ORDER — MEPERIDINE HCL 50 MG/ML IJ SOLN: 6.2500 mg | INTRAMUSCULAR | Status: DC | PRN

## 2016-11-23 MED ORDER — CEFAZOLIN SODIUM-DEXTROSE 2-4 GM/100ML-% IV SOLN
2.0000 g | INTRAVENOUS | Status: AC
  Administered 2016-11-23: 2 g via INTRAVENOUS
  Filled 2016-11-23: qty 100

## 2016-11-23 MED ORDER — FENTANYL CITRATE (PF) 100 MCG/2ML IJ SOLN: 25.0000 ug | INTRAMUSCULAR | Status: DC | PRN

## 2016-11-23 MED ORDER — LIDOCAINE 2% (20 MG/ML) 5 ML SYRINGE
INTRAMUSCULAR | Status: DC | PRN
  Administered 2016-11-23: 40 mg via INTRAVENOUS

## 2016-11-23 MED ORDER — PHENAZOPYRIDINE HCL 100 MG PO TABS: 100.0000 mg | ORAL_TABLET | Freq: Three times a day (TID) | ORAL | 0 refills | Status: DC | PRN

## 2016-11-23 MED ORDER — DEXAMETHASONE SODIUM PHOSPHATE 10 MG/ML IJ SOLN
INTRAMUSCULAR | Status: DC | PRN
  Administered 2016-11-23: 10 mg via INTRAVENOUS

## 2016-11-23 MED ORDER — DEXAMETHASONE SODIUM PHOSPHATE 10 MG/ML IJ SOLN
INTRAMUSCULAR | Status: AC
  Filled 2016-11-23: qty 1

## 2016-11-23 MED ORDER — FENTANYL CITRATE (PF) 100 MCG/2ML IJ SOLN
INTRAMUSCULAR | Status: DC | PRN
  Administered 2016-11-23 (×2): 50 ug via INTRAVENOUS

## 2016-11-23 MED ORDER — PROPOFOL 10 MG/ML IV BOLUS
INTRAVENOUS | Status: AC
  Filled 2016-11-23: qty 20

## 2016-11-23 SURGICAL SUPPLY — 18 items
BAG URO CATCHER STRL LF (MISCELLANEOUS) ×2 IMPLANT
BASKET ZERO TIP NITINOL 2.4FR (BASKET) ×1 IMPLANT
BSKT STON RTRVL ZERO TP 2.4FR (BASKET) ×1
CATH INTERMIT  6FR 70CM (CATHETERS) ×1 IMPLANT
CLOTH BEACON ORANGE TIMEOUT ST (SAFETY) ×2 IMPLANT
COVER SURGICAL LIGHT HANDLE (MISCELLANEOUS) ×2 IMPLANT
FIBER LASER FLEXIVA 365 (UROLOGICAL SUPPLIES) ×1 IMPLANT
FIBER LASER TRAC TIP (UROLOGICAL SUPPLIES) IMPLANT
GLOVE BIOGEL M STRL SZ7.5 (GLOVE) ×2 IMPLANT
GOWN STRL REUS W/TWL LRG LVL3 (GOWN DISPOSABLE) ×4 IMPLANT
GUIDEWIRE ANG ZIPWIRE 038X150 (WIRE) IMPLANT
GUIDEWIRE STR DUAL SENSOR (WIRE) ×2 IMPLANT
IV NS 1000ML (IV SOLUTION) ×2
IV NS 1000ML BAXH (IV SOLUTION) ×1 IMPLANT
MANIFOLD NEPTUNE II (INSTRUMENTS) ×2 IMPLANT
PACK CYSTO (CUSTOM PROCEDURE TRAY) ×2 IMPLANT
SHEATH ACCESS URETERAL 38CM (SHEATH) IMPLANT
TUBING CONNECTING 10 (TUBING) ×2 IMPLANT

## 2016-11-23 NOTE — Anesthesia Procedure Notes (Signed)
Procedure Name: LMA Insertion Performed by: West Pugh Pre-anesthesia Checklist: Patient identified, Emergency Drugs available, Suction available, Patient being monitored and Timeout performed Patient Re-evaluated:Patient Re-evaluated prior to induction Oxygen Delivery Method: Circle system utilized Preoxygenation: Pre-oxygenation with 100% oxygen Induction Type: IV induction Ventilation: Mask ventilation without difficulty LMA: LMA inserted LMA Size: 5.0 Number of attempts: 3 Placement Confirmation: positive ETCO2,  CO2 detector and breath sounds checked- equal and bilateral Tube secured with: Tape Dental Injury: Teeth and Oropharynx as per pre-operative assessment  Comments: Attempted to place LMA #5 with gastric port. Did not seat due to large tongue. Attempt x2 by CRNA without adequate seating. Anesthesiologist attempt x 1 with regular #5 LMA successful.

## 2016-11-23 NOTE — Op Note (Addendum)
Preoperative diagnosis: Right distal ureteral calculus  Postoperative diagnosis: Right distal ureteral calculus  Procedure:  1. Cystoscopy 2. Right ureteroscopy and stone removal 3. Ureteroscopic laser lithotripsy 4. Right retrograde pyelography with interpretation  Surgeon: Pryor Curia. M.D.  Anesthesia: General  Complications: None  Intraoperative findings: Right retrograde pyelography demonstrated a filling defect within the distal right ureter consistent with the patient's known calculus without other abnormalities.  EBL: Minimal  Specimens: 1. Right ureteral calculus  Disposition of specimens: [facility] for stone analysis  Indication: Alexandria Tucker is a 61 y.o. year old patient with urolithiasis.  She presented with a symptomatic right distal ureteral calculus.  After reviewing the management options for treatment, the patient elected to proceed with the above surgical procedure(s). We have discussed the potential benefits and risks of the procedure, side effects of the proposed treatment, the likelihood of the patient achieving the goals of the procedure, and any potential problems that might occur during the procedure or recuperation. Informed consent has been obtained.  Description of procedure:  The patient was taken to the operating room and general anesthesia was induced.  The patient was placed in the dorsal lithotomy position, prepped and draped in the usual sterile fashion, and preoperative antibiotics were administered. A preoperative time-out was performed.   Cystourethroscopy was performed.  The patient's urethra was examined and was normal.  The bladder was then systematically examined in its entirety. There was no evidence for any bladder tumors, stones, or other mucosal pathology.    Attention then turned to the distal right ureteral orifice and a ureteral catheter was used to intubate the ureteral orifice.  Omnipaque contrast was  injected through the ureteral catheter and a retrograde pyelogram was performed with findings as dictated above.  A 0.38 sensor guidewire was then advanced up the right ureter into the renal pelvis under fluoroscopic guidance. The 6 Fr semirigid ureteroscope was then advanced into the ureter next to the guidewire and the calculus was identified.   The stone was then fragmented with the 365 micron holmium laser fiber on a setting of 0.6 J and frequency of 6 Hz.   All stones were then removed from the ureter with a zero tip nitinol basket.  Reinspection of the ureter revealed no remaining visible stones or fragments.   The bladder was then emptied and the procedure ended.  The patient appeared to tolerate the procedure well and without complications.  The patient was able to be awakened and transferred to the recovery unit in satisfactory condition.

## 2016-11-23 NOTE — Anesthesia Postprocedure Evaluation (Signed)
Anesthesia Post Note  Patient: Alexandria Tucker  Procedure(s) Performed: Procedure(s) (LRB): CYSTOSCOPY/RIGHT RETROGRADE/URETEROSCOPY LASER LITHOTRIPSY AND STONE REMOVAL  (Right)     Patient location during evaluation: PACU Anesthesia Type: General Level of consciousness: awake and alert, patient cooperative and oriented Pain management: pain level controlled Vital Signs Assessment: post-procedure vital signs reviewed and stable Respiratory status: spontaneous breathing, nonlabored ventilation and respiratory function stable Cardiovascular status: blood pressure returned to baseline and stable Postop Assessment: no signs of nausea or vomiting Anesthetic complications: no    Last Vitals:  Vitals:   11/23/16 1526  BP: 133/62  Pulse: 74  Resp: 20  Temp: (!) 36.4 C    Last Pain:  Vitals:   11/23/16 1526  TempSrc: Oral  PainSc: 0-No pain                 Janie Capp,E. Ary Rudnick

## 2016-11-23 NOTE — Anesthesia Preprocedure Evaluation (Addendum)
Anesthesia Evaluation  Patient identified by MRN, date of birth, ID band Patient awake    Reviewed: Allergy & Precautions, NPO status , Patient's Chart, lab work & pertinent test results  History of Anesthesia Complications Negative for: history of anesthetic complications  Airway Mallampati: II  TM Distance: >3 FB Neck ROM: Full    Dental  (+) Dental Advisory Given   Pulmonary neg pulmonary ROS,    breath sounds clear to auscultation       Cardiovascular hypertension, Pt. on medications (-) angina Rhythm:Regular Rate:Normal  '17 ECHO: EF 60-65%, valves OK   Neuro/Psych negative neurological ROS     GI/Hepatic negative GI ROS, Neg liver ROS,   Endo/Other  diabetes (glu 106), Oral Hypoglycemic AgentsMorbid obesity  Renal/GU stones     Musculoskeletal   Abdominal (+) + obese,   Peds  Hematology negative hematology ROS (+)   Anesthesia Other Findings   Reproductive/Obstetrics                            Anesthesia Physical Anesthesia Plan  ASA: II  Anesthesia Plan: General   Post-op Pain Management:    Induction: Intravenous  PONV Risk Score and Plan: 4 or greater and Ondansetron, Dexamethasone, Midazolam and Scopolamine patch - Pre-op  Airway Management Planned: LMA  Additional Equipment:   Intra-op Plan:   Post-operative Plan:   Informed Consent: I have reviewed the patients History and Physical, chart, labs and discussed the procedure including the risks, benefits and alternatives for the proposed anesthesia with the patient or authorized representative who has indicated his/her understanding and acceptance.   Dental advisory given  Plan Discussed with: CRNA and Surgeon  Anesthesia Plan Comments: (Plan routine monitors, GA- LMA OK)        Anesthesia Quick Evaluation

## 2016-11-23 NOTE — Transfer of Care (Signed)
Immediate Anesthesia Transfer of Care Note  Patient: Alexandria Tucker  Procedure(s) Performed: Procedure(s): CYSTOSCOPY/RIGHT RETROGRADE/URETEROSCOPY LASER LITHOTRIPSY AND STONE REMOVAL  (Right)  Patient Location: PACU  Anesthesia Type:General  Level of Consciousness:  sedated, patient cooperative and responds to stimulation  Airway & Oxygen Therapy:Patient Spontanous Breathing and Patient connected to face mask oxgen  Post-op Assessment:  Report given to PACU RN and Post -op Vital signs reviewed and stable  Post vital signs:  Reviewed and stable  Last Vitals:  Vitals:   11/23/16 1526  BP: 133/62  Pulse: 74  Resp: 20  Temp: (!) 94.7 C    Complications: No apparent anesthesia complications

## 2016-11-23 NOTE — Discharge Instructions (Addendum)
1. You may see some blood in the urine and may have some burning with urination for 48-72 hours. You also may notice that you have to urinate more frequently or urgently after your procedure which is normal.  °2. You should call should you develop an inability urinate, fever > 101, persistent nausea and vomiting that prevents you from eating or drinking to stay hydrated.  °

## 2016-11-23 NOTE — H&P (Signed)
Office Visit Report     11/23/2016   --------------------------------------------------------------------------------   Alexandria Tucker  MRN: [id number]  PRIMARY CARE:  Evelina Dun, NP  DOB: [birth year], 61 year old Female  REFERRING:  Sherwood Gambler, MD  SSN: [id number]  PROVIDER:  Raynelle Bring, M.D.    LOCATION:  [facility] [phone]   --------------------------------------------------------------------------------   CC/HPI: Right ureteral calculus   Ms. Minjares is a 61 year old female with a history of urolithiasis previously followed by Dr. Serita Butcher. Her last stone episode was in 2011. She developed the acute onset of painless gross hematuria on May 22. This then recurred on June 16 prompting her to see her primary care physician. She apparently was treated empirically for a urinary tract infection with nitrofurantoin although she did not have a positive urine culture to definitively determined that she had infection. Last Thursday, she developed moderate right-sided flank pain and also noted recurrent gross hematuria along with pelvic pressure. She did present for a CT scan of the abdomen and pelvis in the emergency department on 11/21/16. This confirmed a 6 mm distal right ureteral calculus. She has denied any fever. She denies any nausea or vomiting. Her pain and pressure have not been well controlled despite Percocet.     ALLERGIES: Augmentin TABS Niaspan TBCR Niaspan TBCR    MEDICATIONS: Hydrochlorothiazide 25 mg tablet  Metformin Hcl 1,000 mg tablet  Simvastatin 20 mg tablet  Adult Aspirin Low Strength 81 MG TBDP Oral  Altace 10 mg capsule  Altace 10 MG Oral Capsule Oral  HydroCHLOROthiazide 25 MG Oral Tablet Oral  Loratadine TABS Oral  Multiple Vitamin  Trulicity 2.58 NI/7.7 ml pen injector  Zocor 80 MG Oral Tablet Oral     GU PSH: Hysterectomy Unilat SO - 2011      PSH Notes: Hysterectomy, Cesarean Section, right breast- benign cyst removed    NON-GU PSH: Cesarean Delivery Only - 2011    GU PMH: LLQ pain, Abdominal Pain In The Left Lower Belly (LLQ) - 2014 Renal cyst, Renal cyst, acquired, left - 2014 Ureteral calculus, Calculus of ureter - 2014      PMH Notes:  2013-02-23 05:28:33 - Note: Cholelithiasis, unspecified acuity, unspecified acuity  1898-05-02 00:00:00 - Note: Normal Routine History And Physical Adult  2010-03-12 14:20:38 - Note: Nephrolithiasis Of The Left Kidney   NON-GU PMH: Personal history of other diseases of the circulatory system, History of hypertension - 2014 Personal history of other endocrine, nutritional and metabolic disease, History of hypercholesterolemia - 2014 Arthritis Diabetes Type 2 Hypercholesterolemia Hypertension    FAMILY HISTORY: 1 Daughter - Daughter Bladder Disorders - Mother, Grandmother, Aunt Congestive Heart Failure - Father Family Health Status Number - Father Heart Disease - Father, Brother   SOCIAL HISTORY: Marital Status: Married Preferred Language: English; Ethnicity: Not Hispanic Or Latino; Race: White Current Smoking Status: Patient has never smoked.   Tobacco Use Assessment Completed: Used Tobacco in last 30 days? Has never drank.  Does not drink caffeine. Patient's occupation Licensed conveyancer.    REVIEW OF SYSTEMS:    GU Review Female:   Patient reports frequent urination and leakage of urine. Patient denies hard to postpone urination, burning /pain with urination, get up at night to urinate, stream starts and stops, trouble starting your stream, have to strain to urinate, and currently pregnant.  Gastrointestinal (Lower):   Patient denies diarrhea and constipation.  Gastrointestinal (Upper):   Patient denies nausea and vomiting.  Constitutional:   Patient denies fever,  night sweats, weight loss, and fatigue.  Skin:   Patient reports skin rash/ lesion. Patient denies itching.  Eyes:   Patient denies blurred vision and double vision.  Ears/ Nose/ Throat:    Patient denies sore throat and sinus problems.  Hematologic/Lymphatic:   Patient denies swollen glands and easy bruising.  Cardiovascular:   Patient denies leg swelling and chest pains.  Respiratory:   Patient denies cough and shortness of breath.  Endocrine:   Patient denies excessive thirst.  Musculoskeletal:   Patient reports back pain. Patient denies joint pain.  Neurological:   Patient denies headaches and dizziness.  Psychologic:   Patient denies depression and anxiety.   VITAL SIGNS:      11/23/2016 12:05 PM  Weight 226 lb / 102.51 kg  Height 64 in / 162.56 cm  BP 123/77 mmHg  Pulse 78 /min  Temperature 97.9 F / 36.6 C  BMI 38.8 kg/m   MULTI-SYSTEM PHYSICAL EXAMINATION:    Constitutional: Well-nourished. No physical deformities. Normally developed. Good grooming.  Neck: Neck symmetrical, not swollen. Normal tracheal position.  Respiratory: No labored breathing, no use of accessory muscles. Clear bilaterally.  Cardiovascular: Normal temperature, normal extremity pulses, no swelling, no varicosities. Regular rate and rhythm.  Lymphatic: No enlargement of neck, axillae, groin.  Skin: No paleness, no jaundice, no cyanosis. No lesion, no ulcer, no rash.  Neurologic / Psychiatric: Oriented to time, oriented to place, oriented to person. No depression, no anxiety, no agitation.  Gastrointestinal: Soft, nontender, nondistended. No CVA tenderness.  Eyes: Normal conjunctivae. Normal eyelids.  Ears, Nose, Mouth, and Throat: Left ear no scars, no lesions, no masses. Right ear no scars, no lesions, no masses. Nose no scars, no lesions, no masses. Normal hearing. Normal lips.  Musculoskeletal: Normal gait and station of head and neck.     PAST DATA REVIEWED:  Source Of History:  Patient  Records Review:   Previous Patient Records  Urine Test Review:   Urinalysis  X-Ray Review: C.T. Abdomen/Pelvis: Reviewed Films.     PROCEDURES:          Urinalysis w/Scope Dipstick Dipstick Cont'd  Micro  Color: Yellow Bilirubin: Neg WBC/hpf: 10 - 20/hpf  Appearance: Cloudy Ketones: Trace RBC/hpf: 0 - 2/hpf  Specific Gravity: 1.025 Blood: Neg Bacteria: Mod (26-50/hpf)  pH: 5.5 Protein: Neg Cystals: Ca Oxalate  Glucose: Neg Urobilinogen: 0.2 Casts: NS (Not Seen)    Nitrites: Neg Trichomonas: Not Present    Leukocyte Esterase: Trace Mucous: Not Present      Epithelial Cells: 0 - 5/hpf      Yeast: NS (Not Seen)      Sperm: Not Present    ASSESSMENT:      ICD-10 Details  1 GU:   Ureteral calculus - N20.1    PLAN:           Document Letter(s):  Created for Patient: Clinical Summary         Notes:   1. Right ureteral calculus: Considering the fact that her symptoms have not been well controlled and she has been doing this for multiple weeks, she does wish to proceed with definitive therapy rather than medical expulsion although this was offered as an option to her. We reviewed options for treatment of kidney stones and she did wish to proceed with right ureteroscopy and laser lithotripsy and right ureteral stent placement. We reviewed this procedure in detail including the potential risks, complications, and expected recovery process.   This will be scheduled for later  this evening.   Cc: [place]

## 2016-11-24 ENCOUNTER — Encounter: Payer: Self-pay | Admitting: Family

## 2016-11-24 ENCOUNTER — Encounter (HOSPITAL_COMMUNITY): Payer: Self-pay | Admitting: Urology

## 2016-11-24 LAB — HEMOGLOBIN A1C
HEMOGLOBIN A1C: 6.9 % — AB (ref 4.8–5.6)
MEAN PLASMA GLUCOSE: 151 mg/dL

## 2016-11-24 NOTE — Addendum Note (Signed)
Addendum  created 11/24/16 1144 by Annye Asa, MD   Anesthesia Staff edited

## 2016-11-28 ENCOUNTER — Encounter (HOSPITAL_COMMUNITY): Payer: Self-pay | Admitting: Urology

## 2017-01-02 ENCOUNTER — Other Ambulatory Visit: Payer: Self-pay | Admitting: Family

## 2017-01-13 DIAGNOSIS — B354 Tinea corporis: Secondary | ICD-10-CM | POA: Diagnosis not present

## 2017-02-17 ENCOUNTER — Ambulatory Visit: Payer: Commercial Managed Care - PPO | Admitting: Family

## 2017-02-19 ENCOUNTER — Other Ambulatory Visit: Payer: Self-pay | Admitting: Family

## 2017-02-19 DIAGNOSIS — I10 Essential (primary) hypertension: Secondary | ICD-10-CM

## 2017-02-26 IMAGING — NM NM MYOCAR MULTI W/SPECT W/WALL MOTION & EF
2 series · 12 of 12 positions shown · non-contrast
Comparison: none

[Series 1: rest · 8.28mm/px · 6 of 64 frames shown]
[frame 6/64]
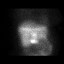
[frame 16/64]
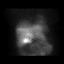
[frame 27/64]
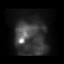
[frame 38/64]
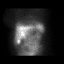
[frame 48/64]
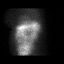
[frame 59/64]
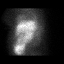

[Series 2: stress gated · 8.28mm/px · 6 of 64 frames shown]
[frame 6/64]
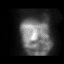
[frame 16/64]
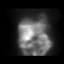
[frame 27/64]
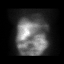
[frame 38/64]
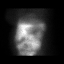
[frame 48/64]
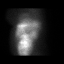
[frame 59/64]
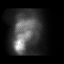

[12 of 12 positions shown; findings below may reference images not displayed]

Canned report from images found in remote index.

Refer to host system for actual result text.

## 2017-02-26 IMAGING — MG MM DIAG BREAST TOMO UNI RIGHT
8 series · 8 of 24 positions shown · non-contrast
Comparison: Previous exam(s).

CLINICAL DATA: The patient was called back due to a prominent right
axillary node which was not included on previous imaging.

EXAM:
2D DIGITAL DIAGNOSTIC RIGHT MAMMOGRAM WITH CAD AND ADJUNCT TOMO
ULTRASOUND RIGHT BREAST

[R ML]
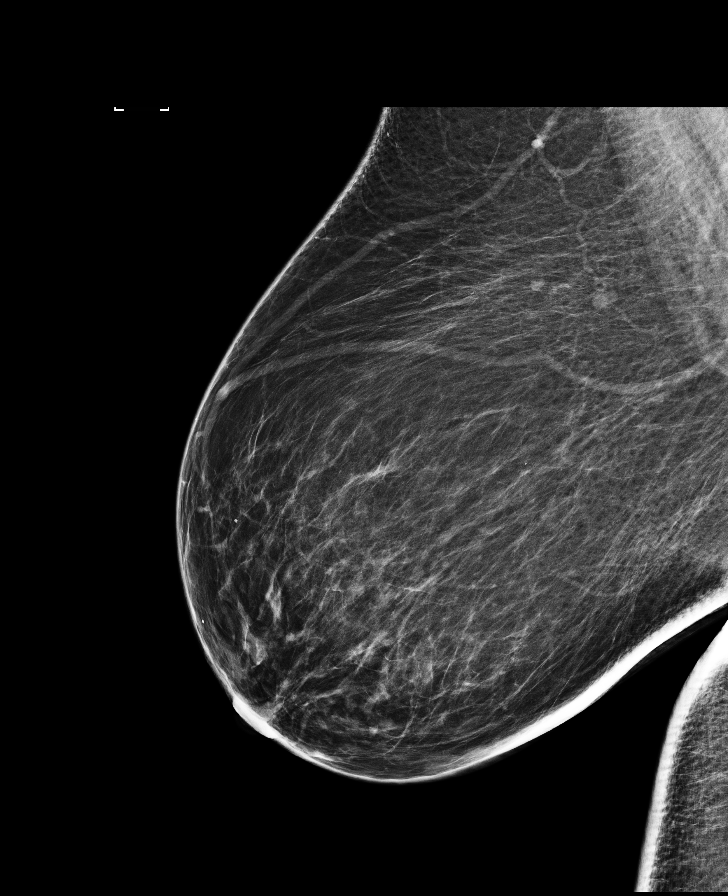

[R MLO (1 of 2)]
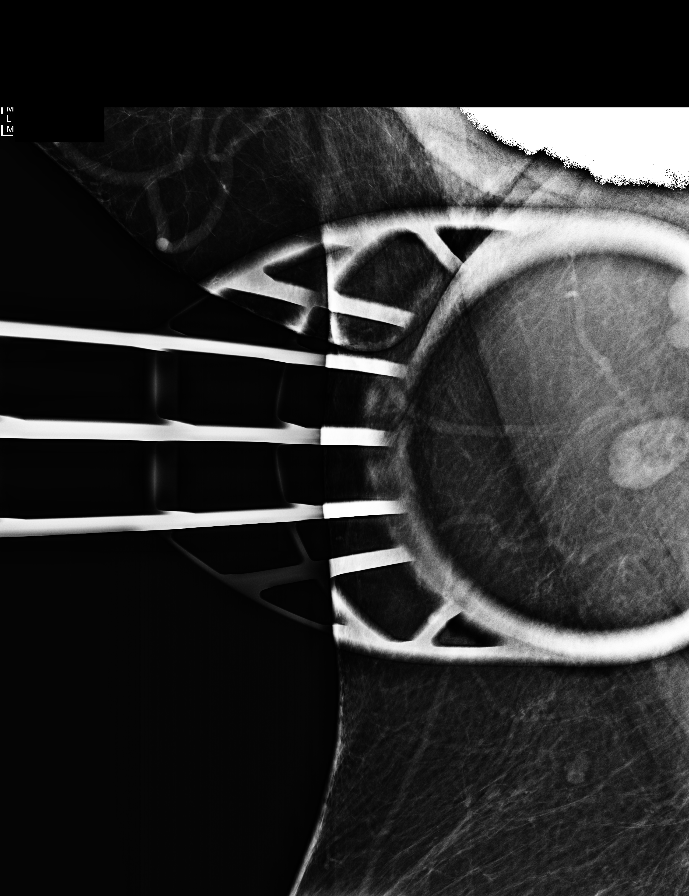

[R MLO (2 of 2)]
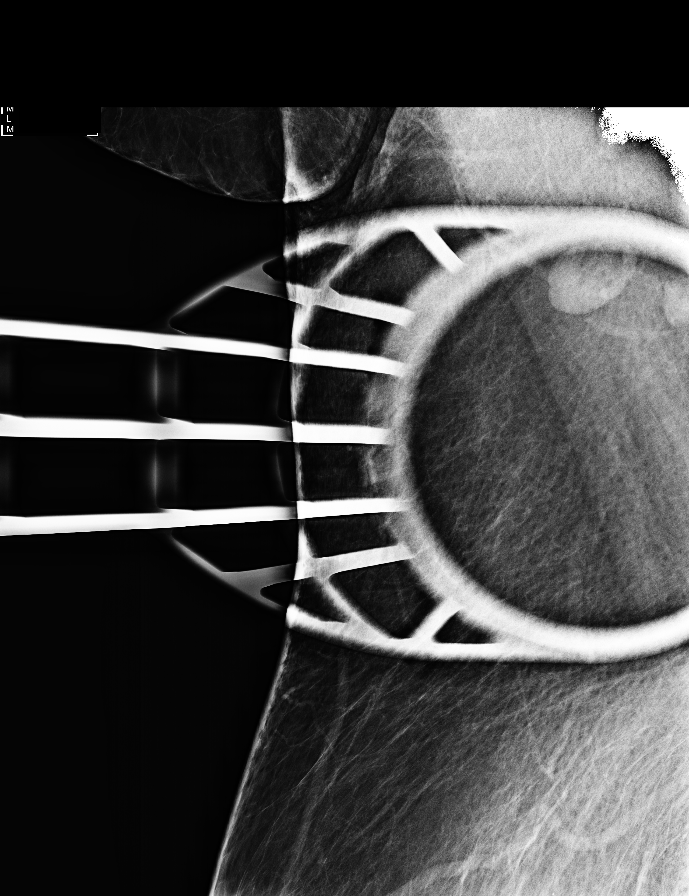

[R XCCL]
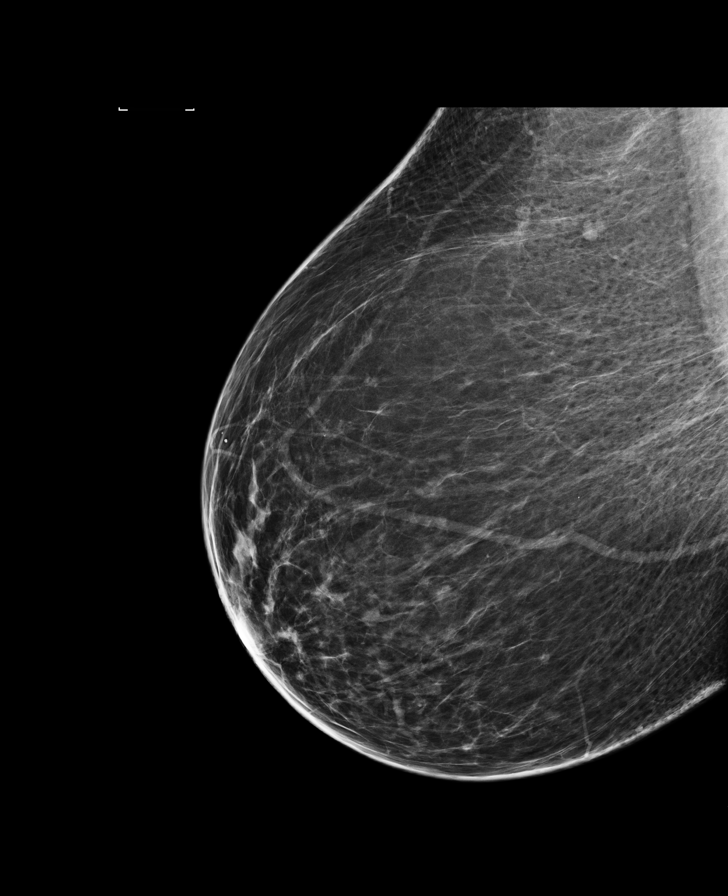

[R XCCL tomo · tomo slice 43/86.0]
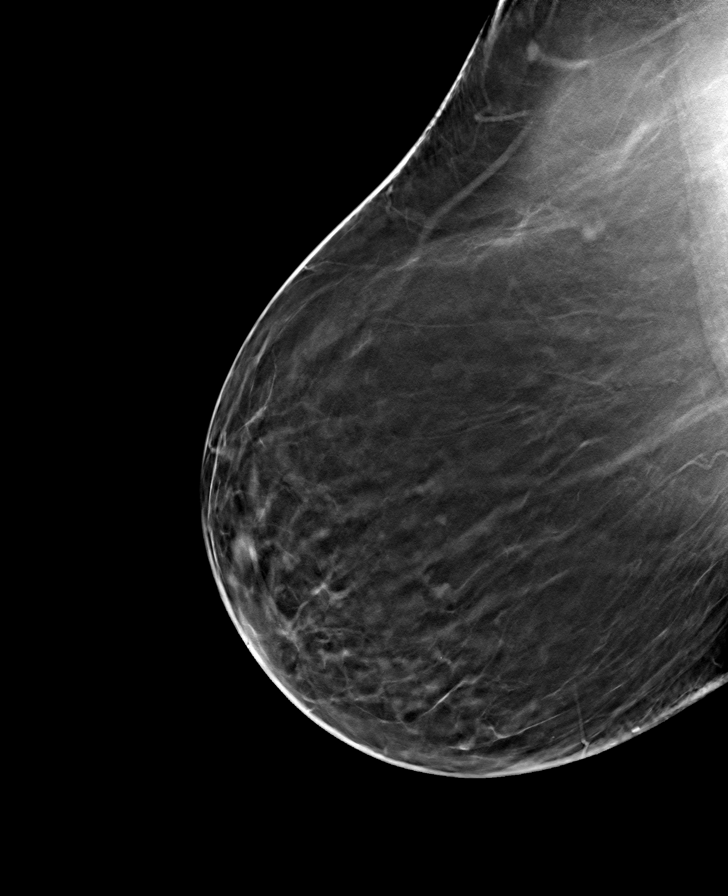

[R ML tomo · tomo slice 44/87.0]
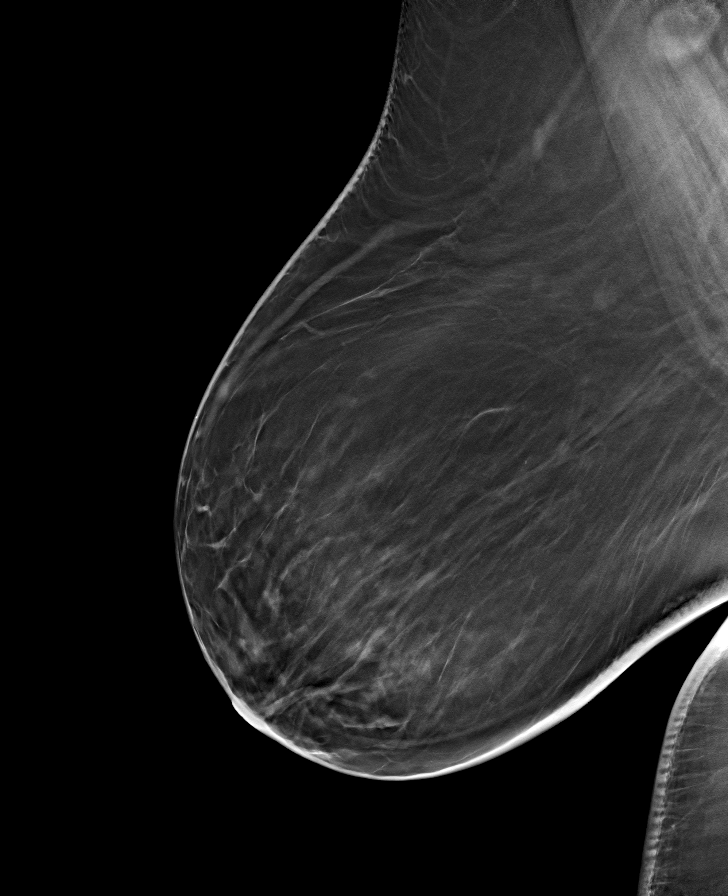

[R MLO tomo (1 of 2) · tomo slice 49/98.0]
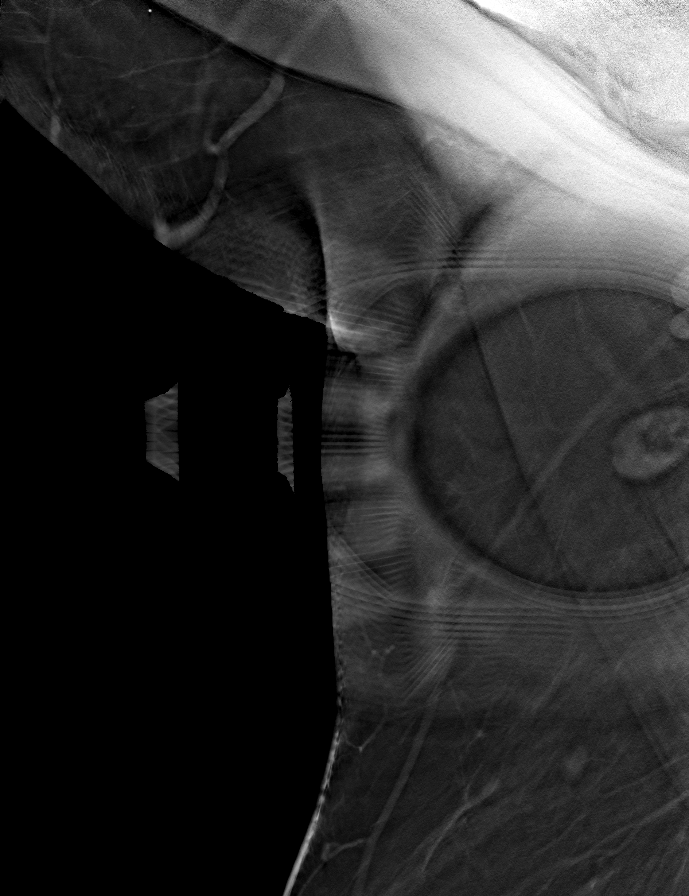

[R MLO tomo (2 of 2) · tomo slice 49/97.0]
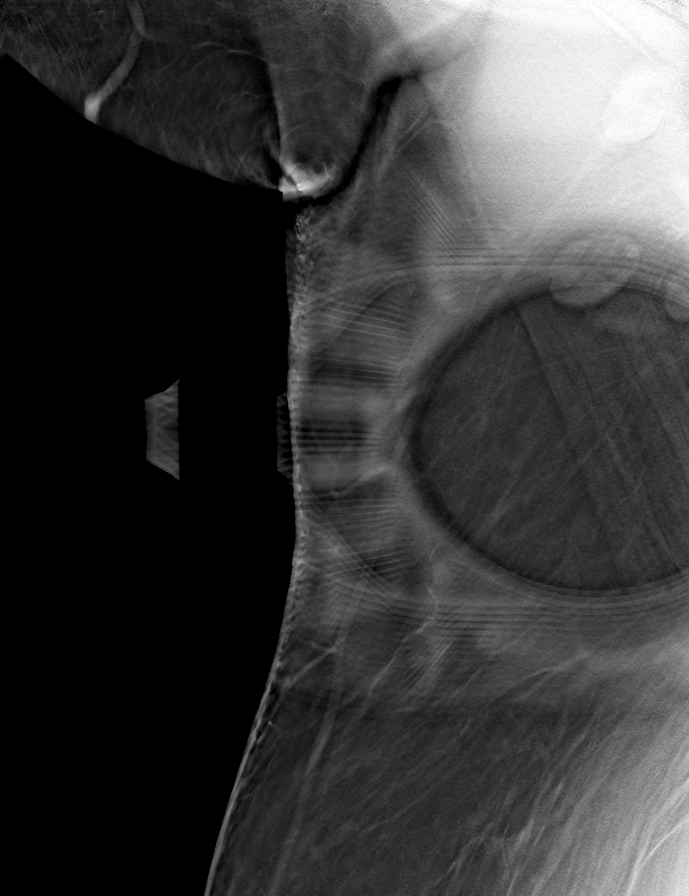

[8 of 24 positions shown; findings below may reference images not displayed]

ACR Breast Density Category b: There are scattered areas of
fibroglandular density.
FINDINGS: The mildly prominent node in the right axilla is unchanged since
screening mammography. The fatty hilum is retained.

Mammographic images were processed with CAD.

On physical exam, no suspicious lumps are identified.

Targeted ultrasound is performed, showing multiple normal nodes in
the left axilla. There is 1 mildly prominent node which correlates
with the mammographic finding. This node measures 1.5 x 0.9 x 1.6 cm
with a cortex measuring 3.9 mm.
IMPRESSION: Mildly prominent right axillary node of low suspicion.

RECOMMENDATION:
Six-month follow-up ultrasound of the probably benign right axillary
node.

I have discussed the findings and recommendations with the patient.
Results were also provided in writing at the conclusion of the
visit. If applicable, a reminder letter will be sent to the patient
regarding the next appointment.

BI-RADS CATEGORY  3: Probably benign.

## 2017-03-03 ENCOUNTER — Ambulatory Visit (INDEPENDENT_AMBULATORY_CARE_PROVIDER_SITE_OTHER): Payer: BLUE CROSS/BLUE SHIELD | Admitting: Family

## 2017-03-03 ENCOUNTER — Encounter: Payer: Self-pay | Admitting: Family

## 2017-03-03 VITALS — BP 112/61 | HR 83 | Temp 96.8°F | Ht 64.0 in | Wt 223.4 lb

## 2017-03-03 DIAGNOSIS — E1159 Type 2 diabetes mellitus with other circulatory complications: Secondary | ICD-10-CM

## 2017-03-03 DIAGNOSIS — E785 Hyperlipidemia, unspecified: Secondary | ICD-10-CM | POA: Diagnosis not present

## 2017-03-03 DIAGNOSIS — I10 Essential (primary) hypertension: Secondary | ICD-10-CM

## 2017-03-03 DIAGNOSIS — I152 Hypertension secondary to endocrine disorders: Secondary | ICD-10-CM

## 2017-03-03 DIAGNOSIS — Z23 Encounter for immunization: Secondary | ICD-10-CM | POA: Diagnosis not present

## 2017-03-03 DIAGNOSIS — E119 Type 2 diabetes mellitus without complications: Secondary | ICD-10-CM

## 2017-03-03 DIAGNOSIS — E1169 Type 2 diabetes mellitus with other specified complication: Secondary | ICD-10-CM | POA: Diagnosis not present

## 2017-03-03 DIAGNOSIS — Z1159 Encounter for screening for other viral diseases: Secondary | ICD-10-CM

## 2017-03-03 LAB — BAYER DCA HB A1C WAIVED: HB A1C: 6.5 % (ref <7.0)

## 2017-03-03 NOTE — Progress Notes (Signed)
 Subjective:    Patient ID: Alexandria Tucker, female    DOB: 08/08/1955, 61 y.o.   MRN: [id number]  Pt presents to the office today for chronic follow up.  Diabetes  She presents for her follow-up diabetic visit. She has type 2 diabetes mellitus. Her disease course has been stable. There are no hypoglycemic associated symptoms. Pertinent negatives for hypoglycemia include no confusion, hunger, mood changes or speech difficulty. Pertinent negatives for diabetes include no blurred vision, no foot paresthesias and no visual change. There are no hypoglycemic complications. Symptoms are stable. Pertinent negatives for diabetic complications include no CVA, heart disease, nephropathy or peripheral neuropathy. Risk factors for coronary artery disease include diabetes mellitus, dyslipidemia and post-menopausal. Her weight is stable. She is following a generally healthy diet. (Pt does not check blood glucose ) Eye exam is not current.  Hypertension  This is a chronic problem. The current episode started more than 1 year ago. The problem has been resolved since onset. The problem is controlled. Pertinent negatives include no blurred vision, malaise/fatigue, peripheral edema or shortness of breath. Risk factors for coronary artery disease include diabetes mellitus, dyslipidemia, obesity and sedentary lifestyle. The current treatment provides moderate improvement. There is no history of kidney disease, CAD/MI, CVA or heart failure.  Hyperlipidemia  This is a chronic problem. The current episode started more than 1 year ago. The problem is controlled. Recent lipid tests were reviewed and are normal. Exacerbating diseases include obesity. Pertinent negatives include no shortness of breath. Current antihyperlipidemic treatment includes statins. The current treatment provides moderate improvement of lipids. Risk factors for coronary artery disease include diabetes mellitus, dyslipidemia, obesity, post-menopausal and a  sedentary lifestyle.      Review of Systems  Constitutional: Negative for malaise/fatigue.  Eyes: Negative for blurred vision.  Respiratory: Negative for shortness of breath.   Neurological: Negative for speech difficulty.  Psychiatric/Behavioral: Negative for confusion.  All other systems reviewed and are negative.      Objective:   Physical Exam  Constitutional: She is oriented to person, place, and time. She appears well-developed and well-nourished. No distress.  HENT:  Head: Normocephalic and atraumatic.  Right Ear: External ear normal.  Left Ear: External ear normal.  Nose: Nose normal.  Mouth/Throat: Oropharynx is clear and moist.  Eyes: Pupils are equal, round, and reactive to light.  Neck: Normal range of motion. Neck supple. No thyromegaly present.  Cardiovascular: Normal rate, regular rhythm, normal heart sounds and intact distal pulses.   No murmur heard. Pulmonary/Chest: Effort normal and breath sounds normal. No respiratory distress. She has no wheezes.  Abdominal: Soft. Bowel sounds are normal. She exhibits no distension. There is no tenderness.  Musculoskeletal: Normal range of motion. She exhibits no edema or tenderness.  Neurological: She is alert and oriented to person, place, and time.  Skin: Skin is warm and dry.  Psychiatric: She has a normal mood and affect. Her behavior is normal. Judgment and thought content normal.  Vitals reviewed.    BP 112/61   Pulse 83   Temp (!) 96.8 F (36 C) (Oral)   Ht 5' 4" (1.626 m)   Wt 223 lb 6.4 oz (101.3 kg)   BMI 38.35 kg/m      Assessment & Plan:  1. Hyperlipidemia associated with type 2 diabetes mellitus (HCC) - CMP14+EGFR - Lipid panel  2. Hypertension associated with diabetes (HCC) - CMP14+EGFR  3. Type 2 diabetes mellitus without complication, without long-term current use of insulin (HCC) - Bayer   DCA Hb A1c Waived - CMP14+EGFR - Ambulatory referral to Ophthalmology - Microalbumin / creatinine  urine ratio  4. Morbid obesity (HCC) - CMP14+EGFR  5. Encounter for hepatitis C screening test for low risk patient - Hepatitis C antibody   Continue all meds Labs pending Health Maintenance reviewed Diet and exercise encouraged RTO 4 months   Christy Hawks, FNP  

## 2017-03-03 NOTE — Patient Instructions (Signed)
Diabetes Mellitus and Food It is important for you to manage your blood sugar (glucose) level. Your blood glucose level can be greatly affected by what you eat. Eating healthier foods in the appropriate amounts throughout the day at about the same time each day will help you control your blood glucose level. It can also help slow or prevent worsening of your diabetes mellitus. Healthy eating may even help you improve the level of your blood pressure and reach or maintain a healthy weight. General recommendations for healthful eating and cooking habits include:  Eating meals and snacks regularly. Avoid going long periods of time without eating to lose weight.  Eating a diet that consists mainly of plant-based foods, such as fruits, vegetables, nuts, legumes, and whole grains.  Using low-heat cooking methods, such as baking, instead of high-heat cooking methods, such as deep frying.  Work with your dietitian to make sure you understand how to use the Nutrition Facts information on food labels. How can food affect me? Carbohydrates Carbohydrates affect your blood glucose level more than any other type of food. Your dietitian will help you determine how many carbohydrates to eat at each meal and teach you how to count carbohydrates. Counting carbohydrates is important to keep your blood glucose at a healthy level, especially if you are using insulin or taking certain medicines for diabetes mellitus. Alcohol Alcohol can cause sudden decreases in blood glucose (hypoglycemia), especially if you use insulin or take certain medicines for diabetes mellitus. Hypoglycemia can be a life-threatening condition. Symptoms of hypoglycemia (sleepiness, dizziness, and disorientation) are similar to symptoms of having too much alcohol. If your health care provider has given you approval to drink alcohol, do so in moderation and use the following guidelines:  Women should not have more than one drink per day, and men  should not have more than two drinks per day. One drink is equal to: ? 12 oz of beer. ? 5 oz of wine. ? 1 oz of hard liquor.  Do not drink on an empty stomach.  Keep yourself hydrated. Have water, diet soda, or unsweetened iced tea.  Regular soda, juice, and other mixers might contain a lot of carbohydrates and should be counted.  What foods are not recommended? As you make food choices, it is important to remember that all foods are not the same. Some foods have fewer nutrients per serving than other foods, even though they might have the same number of calories or carbohydrates. It is difficult to get your body what it needs when you eat foods with fewer nutrients. Examples of foods that you should avoid that are high in calories and carbohydrates but low in nutrients include:  Trans fats (most processed foods list trans fats on the Nutrition Facts label).  Regular soda.  Juice.  Candy.  Sweets, such as cake, pie, doughnuts, and cookies.  Fried foods.  What foods can I eat? Eat nutrient-rich foods, which will nourish your body and keep you healthy. The food you should eat also will depend on several factors, including:  The calories you need.  The medicines you take.  Your weight.  Your blood glucose level.  Your blood pressure level.  Your cholesterol level.  You should eat a variety of foods, including:  Protein. ? Lean cuts of meat. ? Proteins low in saturated fats, such as fish, egg whites, and beans. Avoid processed meats.  Fruits and vegetables. ? Fruits and vegetables that may help control blood glucose levels, such as apples,   mangoes, and yams.  Dairy products. ? Choose fat-free or low-fat dairy products, such as milk, yogurt, and cheese.  Grains, bread, pasta, and rice. ? Choose whole grain products, such as multigrain bread, whole oats, and brown rice. These foods may help control blood pressure.  Fats. ? Foods containing healthful fats, such as  nuts, avocado, olive oil, canola oil, and fish.  Does everyone with diabetes mellitus have the same meal plan? Because every person with diabetes mellitus is different, there is not one meal plan that works for everyone. It is very important that you meet with a dietitian who will help you create a meal plan that is just right for you. This information is not intended to replace advice given to you by your health care provider. Make sure you discuss any questions you have with your health care provider. Document Released: 01/13/2005 Document Revised: 09/24/2015 Document Reviewed: 03/15/2013 Elsevier Interactive Patient Education  2017 [employer]  

## 2017-03-04 LAB — CMP14+EGFR
A/G RATIO: 2 (ref 1.2–2.2)
ALBUMIN: 4.5 g/dL (ref 3.5–5.5)
ALT: 59 IU/L — ABNORMAL HIGH (ref 0–32)
AST: 40 IU/L (ref 0–40)
Alkaline Phosphatase: 72 IU/L (ref 39–117)
BUN / CREAT RATIO: 19 (ref 9–23)
BUN: 13 mg/dL (ref 6–24)
Bilirubin Total: 0.5 mg/dL (ref 0.0–1.2)
CALCIUM: 9.8 mg/dL (ref 8.7–10.2)
CO2: 24 mmol/L (ref 20–29)
Chloride: 99 mmol/L (ref 96–106)
Creatinine, Ser: 0.7 mg/dL (ref 0.57–1.00)
GFR calc Af Amer: 110 mL/min/{1.73_m2} (ref >59)
GFR, EST NON AFRICAN AMERICAN: 95 mL/min/{1.73_m2} (ref >59)
GLOBULIN, TOTAL: 2.3 g/dL (ref 1.5–4.5)
Glucose: 115 mg/dL — ABNORMAL HIGH (ref 65–99)
POTASSIUM: 4 mmol/L (ref 3.5–5.2)
SODIUM: 143 mmol/L (ref 134–144)
Total Protein: 6.8 g/dL (ref 6.0–8.5)

## 2017-03-04 LAB — HEPATITIS C ANTIBODY

## 2017-03-04 LAB — LIPID PANEL
CHOL/HDL RATIO: 4.5 ratio — AB (ref 0.0–4.4)
Cholesterol, Total: 153 mg/dL (ref 100–199)
HDL: 34 mg/dL — ABNORMAL LOW (ref >39)
LDL Calculated: 88 mg/dL (ref 0–99)
TRIGLYCERIDES: 153 mg/dL — AB (ref 0–149)
VLDL CHOLESTEROL CAL: 31 mg/dL (ref 5–40)

## 2017-03-04 LAB — MICROALBUMIN / CREATININE URINE RATIO
CREATININE, UR: 80.9 mg/dL
Microalb/Creat Ratio: 6.4 mg/g creat (ref 0.0–30.0)
Microalbumin, Urine: 5.2 ug/mL

## 2017-03-10 ENCOUNTER — Ambulatory Visit: Payer: Commercial Managed Care - PPO | Admitting: Family

## 2017-03-28 ENCOUNTER — Telehealth: Payer: Self-pay | Admitting: Family

## 2017-03-29 NOTE — Telephone Encounter (Signed)
Aware. One sample of trulicity ready.

## 2017-04-10 ENCOUNTER — Other Ambulatory Visit: Payer: Self-pay | Admitting: Family

## 2017-04-26 ENCOUNTER — Telehealth: Payer: Self-pay | Admitting: Family

## 2017-04-26 ENCOUNTER — Other Ambulatory Visit: Payer: Self-pay | Admitting: Family

## 2017-04-26 DIAGNOSIS — I10 Essential (primary) hypertension: Secondary | ICD-10-CM

## 2017-04-26 NOTE — Telephone Encounter (Signed)
Aware.  No sample available. 

## 2017-04-28 ENCOUNTER — Other Ambulatory Visit: Payer: Self-pay | Admitting: Family

## 2017-04-28 ENCOUNTER — Encounter: Payer: Self-pay | Admitting: Family

## 2017-04-28 MED ORDER — EMPAGLIFLOZIN 10 MG PO TABS: 10.0000 mg | ORAL_TABLET | Freq: Every day | ORAL | 1 refills | Status: DC

## 2017-06-27 ENCOUNTER — Encounter: Payer: Self-pay | Admitting: *Deleted

## 2017-07-09 ENCOUNTER — Other Ambulatory Visit: Payer: Self-pay | Admitting: Family

## 2017-07-10 NOTE — Telephone Encounter (Signed)
03/03/17 OV says 4 mos rck

## 2017-08-03 ENCOUNTER — Encounter: Payer: Self-pay | Admitting: *Deleted

## 2017-08-07 ENCOUNTER — Telehealth: Payer: Self-pay | Admitting: Family

## 2017-08-07 DIAGNOSIS — E119 Type 2 diabetes mellitus without complications: Secondary | ICD-10-CM

## 2017-08-07 DIAGNOSIS — E1169 Type 2 diabetes mellitus with other specified complication: Secondary | ICD-10-CM

## 2017-08-07 DIAGNOSIS — I10 Essential (primary) hypertension: Secondary | ICD-10-CM

## 2017-08-07 DIAGNOSIS — E785 Hyperlipidemia, unspecified: Secondary | ICD-10-CM

## 2017-08-07 DIAGNOSIS — Z0000 Encounter for general adult medical examination without abnormal findings: Secondary | ICD-10-CM

## 2017-08-07 DIAGNOSIS — E1159 Type 2 diabetes mellitus with other circulatory complications: Secondary | ICD-10-CM

## 2017-08-07 DIAGNOSIS — I152 Hypertension secondary to endocrine disorders: Secondary | ICD-10-CM

## 2017-08-08 NOTE — Telephone Encounter (Signed)
Labs ordered.

## 2017-08-24 ENCOUNTER — Encounter: Payer: BLUE CROSS/BLUE SHIELD | Admitting: Family

## 2017-08-25 ENCOUNTER — Encounter: Payer: BLUE CROSS/BLUE SHIELD | Admitting: Family

## 2017-08-31 ENCOUNTER — Encounter: Payer: BLUE CROSS/BLUE SHIELD | Admitting: Family

## 2017-09-01 ENCOUNTER — Encounter: Payer: BLUE CROSS/BLUE SHIELD | Admitting: Family

## 2017-09-17 IMAGING — MG BREAST SURGICAL SPECIMEN
1 series · 1 of 1 positions shown · non-contrast
Comparison: Previous exam(s).

CLINICAL DATA: Status post excision of right breast papilloma.

EXAM:
SPECIMEN RADIOGRAPH OF THE RIGHT BREAST

[R]
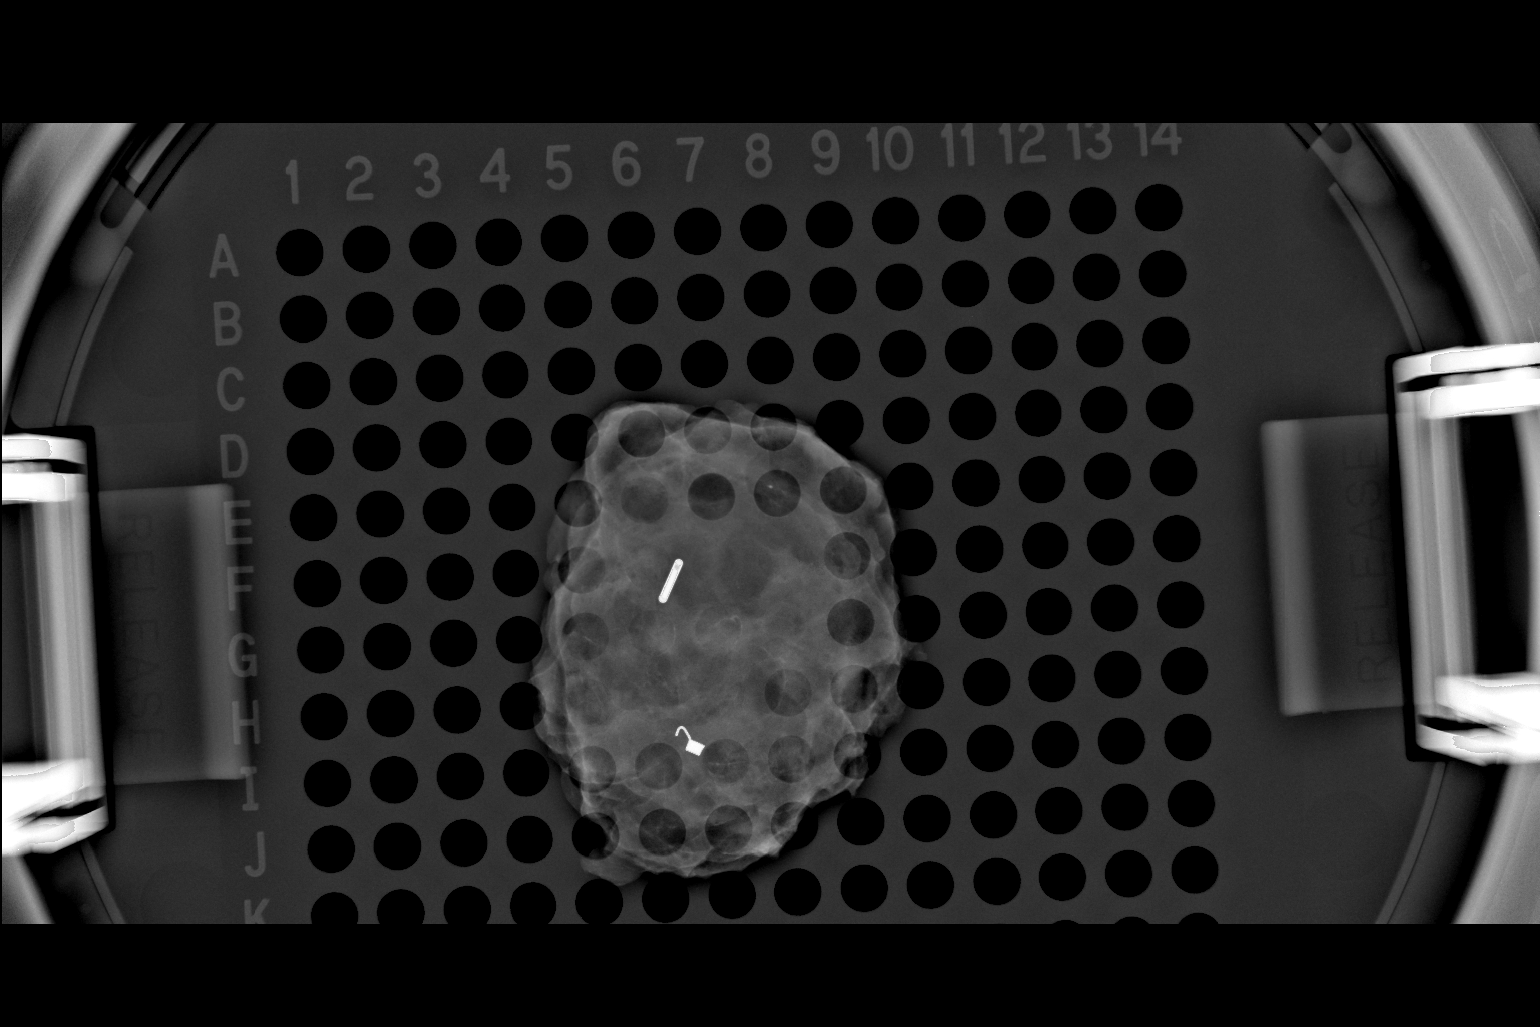

[1 of 1 positions shown; findings below may reference images not displayed]

FINDINGS: Status post excision of the right breast. The radioactive seed and
biopsy marker clip are present, completely intact, and were marked
for pathology.
IMPRESSION: Specimen radiograph of the right breast.

## 2017-09-18 ENCOUNTER — Other Ambulatory Visit: Payer: Self-pay | Admitting: Family

## 2017-09-18 DIAGNOSIS — I10 Essential (primary) hypertension: Secondary | ICD-10-CM

## 2017-09-22 ENCOUNTER — Encounter: Payer: BLUE CROSS/BLUE SHIELD | Admitting: Family

## 2017-09-27 ENCOUNTER — Ambulatory Visit: Payer: BLUE CROSS/BLUE SHIELD | Admitting: Family Medicine

## 2017-09-27 ENCOUNTER — Encounter: Payer: Self-pay | Admitting: Family Medicine

## 2017-09-27 VITALS — BP 103/66 | HR 93 | Temp 97.2°F | Ht 64.0 in | Wt 218.0 lb

## 2017-09-27 DIAGNOSIS — L600 Ingrowing nail: Secondary | ICD-10-CM | POA: Diagnosis not present

## 2017-09-27 MED ORDER — SULFAMETHOXAZOLE-TRIMETHOPRIM 800-160 MG PO TABS: 1.0000 | ORAL_TABLET | Freq: Two times a day (BID) | ORAL | 0 refills | Status: DC

## 2017-09-27 NOTE — Progress Notes (Signed)
BP 103/66   Pulse 93   Temp (!) 97.2 F (36.2 C) (Oral)   Ht 5\' 4"  (1.626 m)   Wt 218 lb (98.9 kg)   BMI 37.42 kg/m    Subjective:    Patient ID: Alexandria Tucker, female    DOB: 1955/08/29, 62 y.o.   MRN: [id number]  HPI: Patient is a 62yr old patient who presents to the clinic today with 1 month history right great ingrown toenail. Patient states she began having pain on the lateral aspect and end of her right great toe at the beginning of May. Her husband successfully removed part of the ingrown toenail with tweezers, which alleviated her pain for approximately 2 weeks. Since then, she has experienced worsening pain and has been unable to tolerate physical contact. Over the last couple of days, even the weight of her bed sheets are unbearable.  She reports walking with a limp over the past week due to discomfort of bearing weight on the affected toe. She has been applying "Dr. Felicie Morn ingrown toenail softner" and Epsom salt baths since Saturday with no relief. Patient admits to having issues with ingrown toenails bilaterally in the past, that she has been able to manage at home. She denies fevers, chills, NVD.   Relevant past medical, surgical, family and social history reviewed and updated as indicated. Interim medical history since our last visit reviewed. Allergies and medications reviewed and updated.  Review of Systems  Constitutional: Negative.   HENT: Negative.   Eyes: Negative.   Respiratory: Negative.   Cardiovascular: Negative.   Gastrointestinal: Negative.   Endocrine: Negative.   Genitourinary: Negative.   Musculoskeletal: Positive for gait problem.       Pain with weight bearing on right great toe  Skin: Positive for color change.       Right great toe erythematous  Allergic/Immunologic: Negative.   Hematological: Negative.   Psychiatric/Behavioral: Negative.     Per HPI unless specifically indicated above   Allergies as of 09/27/2017      Reactions   Atorvastatin Other (See Comments)   Augmentin [amoxicillin-pot Clavulanate] Other (See Comments)   Thrush   Niaspan [niacin Er] Other (See Comments)   Burning, itching skin.      Medication List        Accurate as of 09/27/17 11:17 AM. Always use your most recent med list.          aspirin EC 81 MG tablet Take 81 mg by mouth daily.   empagliflozin 10 MG Tabs tablet Commonly known as:  JARDIANCE Take 10 mg by mouth daily.   fexofenadine 180 MG tablet Commonly known as:  ALLEGRA Take 1 tablet (180 mg total) by mouth daily.   fluticasone 50 MCG/ACT nasal spray Commonly known as:  FLONASE One to 2 sprays each nostril at bedtime   hydrochlorothiazide 25 MG tablet Commonly known as:  HYDRODIURIL TAKE 1 TABLET BY MOUTH ONCE DAILY   metFORMIN 1000 MG tablet Commonly known as:  GLUCOPHAGE TAKE ONE TABLET BY MOUTH TWICE DAILY WITH A MEAL   ramipril 10 MG capsule Commonly known as:  ALTACE Take 1 capsule (10 mg total) by mouth daily.   simvastatin 20 MG tablet Commonly known as:  ZOCOR TAKE 1 TABLET BY MOUTH ONCE DAILY AT BEDTIME   sulfamethoxazole-trimethoprim 800-160 MG tablet Commonly known as:  BACTRIM DS,SEPTRA DS Take 1 tablet by mouth 2 (two) times daily.          Objective:  BP 103/66   Pulse 93   Temp (!) 97.2 F (36.2 C) (Oral)   Ht 5\' 4"  (1.626 m)   Wt 218 lb (98.9 kg)   BMI 37.42 kg/m   Wt Readings from Last 3 Encounters:  09/27/17 218 lb (98.9 kg)  03/03/17 223 lb 6.4 oz (101.3 kg)  11/23/16 226 lb (102.5 kg)    Physical Exam  Constitutional: She appears well-developed and well-nourished. No distress.  HENT:  Head: Normocephalic and atraumatic.  Eyes: Conjunctivae are normal.  Neck: Normal range of motion. Neck supple.  Cardiovascular: Normal rate, regular rhythm and normal heart sounds.  Pulmonary/Chest: Effort normal.  Musculoskeletal: Normal range of motion. She exhibits no edema.  Skin: Skin is warm and dry. There is erythema  (Erythema and swelling around the lateral part of the toenail).  Psychiatric: She has a normal mood and affect.     Nail removal of right great toenail lateral plate: Betadine used for prep. 2% lidocaine with epinephrine was used for digital block, 37mL. Rubber band used for tourniquet to remove blood flow and decreased bleeding. Elevator was used to lift the nail bed and then forceps used to remove it from the base. Nail removed easily and probe was used to remove any leftover debris from nail bed. Topical antibiotic was used and then it was covered by 4 x 4 and tape told in place. Procedure was tolerated well with minimal bleeding.     Assessment & Plan:   Problem List Items Addressed This Visit    None    Visit Diagnoses    Ingrown toenail of right foot with infection    -  Primary   medial side   Relevant Medications   sulfamethoxazole-trimethoprim (BACTRIM DS,SEPTRA DS) 800-160 MG tablet       Assessment: Patient presents today with erythematous right great toe that is extremely tender to palpation. History and physical exam are consistent with lateral aspect right great ingrown toenail.  Follow up plan: Following the removal of the toenail, I have instructed the patient to leave the dressing on for 24hrs. I have instructed her to continue applying a topical antibiotic and to keep the area clean and dry. Prior to the procedure, I explained the increased risk of infection associated with this procedure given her diabetic history. She has been instructed to call if symptoms worsen/fail to improve, or if she has any questions or concerns.  Darla Lesches, PA-S  Counseling provided for all of the vaccine components No orders of the defined types were placed in this encounter.  Patient seen and examined with Vita Barley, PA student, agree with assessment and plan above.  Procedure was performed by me personally.  Patient will follow the dressing instructions and call us back  if anything worsens or does not improve. Caryl Pina, MD [facility] 09/27/2017, 11:17 AM

## 2017-09-29 ENCOUNTER — Ambulatory Visit (INDEPENDENT_AMBULATORY_CARE_PROVIDER_SITE_OTHER): Payer: BLUE CROSS/BLUE SHIELD | Admitting: Family

## 2017-09-29 ENCOUNTER — Encounter: Payer: Self-pay | Admitting: Family

## 2017-09-29 VITALS — BP 114/77 | HR 87 | Temp 97.1°F | Ht 64.0 in | Wt 216.0 lb

## 2017-09-29 DIAGNOSIS — Z0000 Encounter for general adult medical examination without abnormal findings: Secondary | ICD-10-CM | POA: Diagnosis not present

## 2017-09-29 DIAGNOSIS — I10 Essential (primary) hypertension: Secondary | ICD-10-CM

## 2017-09-29 DIAGNOSIS — J301 Allergic rhinitis due to pollen: Secondary | ICD-10-CM

## 2017-09-29 DIAGNOSIS — B353 Tinea pedis: Secondary | ICD-10-CM

## 2017-09-29 DIAGNOSIS — E119 Type 2 diabetes mellitus without complications: Secondary | ICD-10-CM

## 2017-09-29 DIAGNOSIS — E1159 Type 2 diabetes mellitus with other circulatory complications: Secondary | ICD-10-CM | POA: Diagnosis not present

## 2017-09-29 DIAGNOSIS — I152 Hypertension secondary to endocrine disorders: Secondary | ICD-10-CM

## 2017-09-29 DIAGNOSIS — E785 Hyperlipidemia, unspecified: Secondary | ICD-10-CM

## 2017-09-29 DIAGNOSIS — E1169 Type 2 diabetes mellitus with other specified complication: Secondary | ICD-10-CM

## 2017-09-29 LAB — BAYER DCA HB A1C WAIVED: HB A1C (BAYER DCA - WAIVED): 6.5 % (ref <7.0)

## 2017-09-29 MED ORDER — MICONAZOLE NITRATE 2 % EX CREA: 1.0000 "application " | TOPICAL_CREAM | Freq: Two times a day (BID) | CUTANEOUS | 1 refills | Status: DC

## 2017-09-29 NOTE — Progress Notes (Signed)
Subjective:    Patient ID: Alexandria Tucker, female    DOB: 07-23-1955, 62 y.o.   MRN: [id number]  Chief Complaint  Patient presents with  . Medical Management of Chronic Issues   PT presents to the office today for CPE.  Diabetes  She presents for her follow-up diabetic visit. She has type 2 diabetes mellitus. Her disease course has been stable. There are no hypoglycemic associated symptoms. Pertinent negatives for diabetes include no blurred vision, no foot paresthesias, no foot ulcerations and no visual change. There are no hypoglycemic complications. Symptoms are stable. There are no diabetic complications. Pertinent negatives for diabetic complications include no CVA. Risk factors for coronary artery disease include dyslipidemia, obesity, female sex, hypertension and diabetes mellitus. She is following a generally unhealthy diet. Her overall blood glucose range is 180-200 mg/dl. Eye exam is not current (12/2015).  Hypertension  This is a chronic problem. The current episode started more than 1 year ago. The problem has been resolved since onset. Pertinent negatives include no blurred vision, malaise/fatigue, peripheral edema or shortness of breath. The current treatment provides moderate improvement. There is no history of kidney disease, CAD/MI, CVA or heart failure.  Hyperlipidemia  This is a chronic problem. The current episode started more than 1 year ago. The problem is uncontrolled. Recent lipid tests were reviewed and are high. Exacerbating diseases include obesity. Pertinent negatives include no shortness of breath. Current antihyperlipidemic treatment includes statins. The current treatment provides moderate improvement of lipids.      Review of Systems  Constitutional: Negative for malaise/fatigue.  Eyes: Negative for blurred vision.  Respiratory: Negative for shortness of breath.   All other systems reviewed and are negative.      Objective:   Physical Exam    Constitutional: She is oriented to person, place, and time. She appears well-developed and well-nourished. No distress.  HENT:  Head: Normocephalic and atraumatic.  Right Ear: External ear normal.  Left Ear: External ear normal.  Nose: Nose normal.  Mouth/Throat: Oropharynx is clear and moist.  Eyes: Pupils are equal, round, and reactive to light.  Neck: Normal range of motion. Neck supple. No thyromegaly present.  Cardiovascular: Normal rate, regular rhythm, normal heart sounds and intact distal pulses.  No murmur heard. Pulmonary/Chest: Effort normal and breath sounds normal. No respiratory distress. She has no wheezes.  Abdominal: Soft. Bowel sounds are normal. She exhibits no distension. There is no tenderness.  Musculoskeletal: Normal range of motion. She exhibits no edema or tenderness.  Neurological: She is alert and oriented to person, place, and time. She has normal reflexes. No cranial nerve deficit.  Skin: Skin is warm and dry.  Psychiatric: She has a normal mood and affect. Her behavior is normal. Judgment and thought content normal.  Vitals reviewed.     BP 114/77   Pulse 87   Temp (!) 97.1 F (36.2 C) (Oral)   Ht _0  (1.626 m)   Wt 216 lb (98 kg)   BMI 37.08 kg/m      Assessment & Plan:  Alexandria Tucker comes in today with chief complaint of Medical Management of Chronic Issues   Diagnosis and orders addressed:  1. Annual physical exam - Bayer DCA Hb A1c Waived - CMP14+EGFR - Lipid panel - CBC with Differential/Platelet - TSH  2. Hypertension associated with diabetes ([place]) - CMP14+EGFR - CBC with Differential/Platelet  3. Hyperlipidemia associated with type 2 diabetes mellitus (HCC) - CMP14+EGFR - CBC with Differential/Platelet  4. Type  2 diabetes mellitus without complication, without long-term current use of insulin (HCC) - CMP14+EGFR - CBC with Differential/Platelet  5. Morbid obesity ([place]) - CMP14+EGFR - CBC with  Differential/Platelet  6. Allergic rhinitis due to pollen, unspecified seasonality - CMP14+EGFR - CBC with Differential/Platelet   Labs pending Health Maintenance reviewed Diet and exercise encouraged  Follow up plan: 3 months    Evelina Dun, FNP

## 2017-09-29 NOTE — Patient Instructions (Signed)
Athlete's Foot Athlete's foot (tinea pedis) is a fungal infection of the skin on the feet. It often occurs on the skin that is between or underneath the toes. It can also occur on the soles of the feet. The infection can spread from person to person (is contagious). What are the causes? Athlete's foot is caused by a fungus. This fungus grows in warm, moist places. Most people get athlete's foot by sharing shower stalls, towels, and wet floors with someone who is infected. Not washing your feet or changing your socks often enough can contribute to athlete's foot. What increases the risk? This condition is more likely to develop in:  Men.  People who have a weak body defense system (immune system).  People who have diabetes.  People who use public showers, such as at a gym.  People who wear heavy-duty shoes, such as industrial or military shoes.  Seasons with warm, humid weather.  What are the signs or symptoms? Symptoms of this condition include:  Itchy areas between the toes or on the soles of the feet.  White, flaky, or scaly areas between the toes or on the soles of the feet.  Very itchy small blisters between the toes or on the soles of the feet.  Small cuts on the skin. These cuts can become infected.  Thick or discolored toenails.  How is this diagnosed? This condition is diagnosed with a medical history and physical exam. Your health care provider may also take a skin or toenail sample to be examined. How is this treated? Treatment for this condition includes antifungal medicines. These may be applied as powders, ointments, or creams. In severe cases, an oral antifungal medicine may be given. Follow these instructions at home:  Apply or take over-the-counter and prescription medicines only as told by your health care provider.  Keep all follow-up visits as told by your health care provider. This is important.  Do not scratch your feet.  Keep your feet dry: ? Wear  cotton or wool socks. Change your socks every day or if they become wet. ? Wear shoes that allow air to circulate, such as sandals or canvas tennis shoes.  Wash and dry your feet: ? Every day or as told by your health care provider. ? After exercising. ? Including the area between your toes.  Do not share towels, nail clippers, or other personal items that touch your feet with others.  If you have diabetes, keep your blood sugar under control. How is this prevented?  Do not share towels.  Wear sandals in wet areas, such as locker rooms and shared showers.  Keep your feet dry: ? Wear cotton or wool socks. Change your socks every day or if they become wet. ? Wear shoes that allow air to circulate, such as sandals or canvas tennis shoes.  Wash and dry your feet after exercising. Pay attention to the area between your toes. Contact a health care provider if:  You have a fever.  You have swelling, soreness, warmth, or redness in your foot.  You are not getting better with treatment.  Your symptoms get worse.  You have new symptoms. This information is not intended to replace advice given to you by your health care provider. Make sure you discuss any questions you have with your health care provider. Document Released: 04/15/2000 Document Revised: 09/24/2015 Document Reviewed: 10/20/2014 Elsevier Interactive Patient Education  2018 Elsevier Inc.  

## 2017-09-30 LAB — CBC WITH DIFFERENTIAL/PLATELET
BASOS ABS: 0.1 10*3/uL (ref 0.0–0.2)
Basos: 0 %
EOS (ABSOLUTE): 0.3 10*3/uL (ref 0.0–0.4)
Eos: 3 %
HEMATOCRIT: 42 % (ref 34.0–46.6)
Hemoglobin: 13.3 g/dL (ref 11.1–15.9)
Immature Grans (Abs): 0.1 10*3/uL (ref 0.0–0.1)
Immature Granulocytes: 1 %
LYMPHS: 19 %
Lymphocytes Absolute: 2.2 10*3/uL (ref 0.7–3.1)
MCH: 27 pg (ref 26.6–33.0)
MCHC: 31.7 g/dL (ref 31.5–35.7)
MCV: 85 fL (ref 79–97)
MONOCYTES: 7 %
Monocytes Absolute: 0.8 10*3/uL (ref 0.1–0.9)
Neutrophils Absolute: 8.2 10*3/uL — ABNORMAL HIGH (ref 1.4–7.0)
Neutrophils: 70 %
Platelets: 391 10*3/uL (ref 150–450)
RBC: 4.92 x10E6/uL (ref 3.77–5.28)
RDW: 15.5 % — AB (ref 12.3–15.4)
WBC: 11.6 10*3/uL — AB (ref 3.4–10.8)

## 2017-09-30 LAB — CMP14+EGFR
A/G RATIO: 1.7 (ref 1.2–2.2)
ALBUMIN: 4.7 g/dL (ref 3.6–4.8)
ALT: 70 IU/L — ABNORMAL HIGH (ref 0–32)
AST: 57 IU/L — ABNORMAL HIGH (ref 0–40)
Alkaline Phosphatase: 79 IU/L (ref 39–117)
BILIRUBIN TOTAL: 0.5 mg/dL (ref 0.0–1.2)
BUN / CREAT RATIO: 18 (ref 12–28)
BUN: 13 mg/dL (ref 8–27)
CHLORIDE: 101 mmol/L (ref 96–106)
CO2: 21 mmol/L (ref 20–29)
Calcium: 10.1 mg/dL (ref 8.7–10.3)
Creatinine, Ser: 0.72 mg/dL (ref 0.57–1.00)
GFR calc non Af Amer: 91 mL/min/{1.73_m2} (ref >59)
GFR, EST AFRICAN AMERICAN: 105 mL/min/{1.73_m2} (ref >59)
GLOBULIN, TOTAL: 2.7 g/dL (ref 1.5–4.5)
Glucose: 116 mg/dL — ABNORMAL HIGH (ref 65–99)
POTASSIUM: 4.2 mmol/L (ref 3.5–5.2)
Sodium: 140 mmol/L (ref 134–144)
TOTAL PROTEIN: 7.4 g/dL (ref 6.0–8.5)

## 2017-09-30 LAB — LIPID PANEL
Chol/HDL Ratio: 5.2 ratio — ABNORMAL HIGH (ref 0.0–4.4)
Cholesterol, Total: 173 mg/dL (ref 100–199)
HDL: 33 mg/dL — ABNORMAL LOW (ref >39)
LDL Calculated: 101 mg/dL — ABNORMAL HIGH (ref 0–99)
Triglycerides: 195 mg/dL — ABNORMAL HIGH (ref 0–149)
VLDL Cholesterol Cal: 39 mg/dL (ref 5–40)

## 2017-09-30 LAB — TSH: TSH: 2.89 u[IU]/mL (ref 0.450–4.500)

## 2017-10-03 ENCOUNTER — Other Ambulatory Visit: Payer: Self-pay | Admitting: Family

## 2017-10-06 ENCOUNTER — Telehealth: Payer: Self-pay | Admitting: Family

## 2017-10-06 NOTE — Telephone Encounter (Signed)
Pt called stating rash on feet is no better Cream prescribed is not helping Please change to something else

## 2017-10-09 MED ORDER — TERBINAFINE HCL 250 MG PO TABS: 250.0000 mg | ORAL_TABLET | Freq: Every day | ORAL | 0 refills | Status: DC

## 2017-10-09 NOTE — Telephone Encounter (Signed)
Sent in Terbinafine 250 mg daily for 4 weeks. Continue cream.

## 2017-10-09 NOTE — Telephone Encounter (Signed)
Patient aware.

## 2017-11-17 ENCOUNTER — Other Ambulatory Visit: Payer: Self-pay | Admitting: Family

## 2017-12-01 DIAGNOSIS — H524 Presbyopia: Secondary | ICD-10-CM | POA: Diagnosis not present

## 2017-12-01 DIAGNOSIS — E119 Type 2 diabetes mellitus without complications: Secondary | ICD-10-CM | POA: Diagnosis not present

## 2017-12-01 LAB — HM DIABETES EYE EXAM

## 2017-12-17 ENCOUNTER — Other Ambulatory Visit: Payer: Self-pay | Admitting: Family

## 2017-12-17 DIAGNOSIS — I10 Essential (primary) hypertension: Secondary | ICD-10-CM

## 2017-12-24 ENCOUNTER — Other Ambulatory Visit: Payer: Self-pay | Admitting: Family

## 2017-12-25 NOTE — Telephone Encounter (Signed)
Last seen 09/29/17  [facility]

## 2018-02-02 DIAGNOSIS — Z01419 Encounter for gynecological examination (general) (routine) without abnormal findings: Secondary | ICD-10-CM | POA: Diagnosis not present

## 2018-02-02 DIAGNOSIS — Z1231 Encounter for screening mammogram for malignant neoplasm of breast: Secondary | ICD-10-CM | POA: Diagnosis not present

## 2018-02-02 DIAGNOSIS — Z6835 Body mass index (BMI) 35.0-35.9, adult: Secondary | ICD-10-CM | POA: Diagnosis not present

## 2018-02-05 ENCOUNTER — Ambulatory Visit: Payer: BLUE CROSS/BLUE SHIELD

## 2018-02-08 ENCOUNTER — Encounter: Payer: Self-pay | Admitting: Family Medicine

## 2018-02-08 ENCOUNTER — Ambulatory Visit: Payer: BLUE CROSS/BLUE SHIELD

## 2018-02-08 ENCOUNTER — Ambulatory Visit: Payer: BLUE CROSS/BLUE SHIELD | Admitting: Family Medicine

## 2018-02-08 VITALS — BP 134/85 | HR 101 | Temp 97.3°F | Ht 64.0 in | Wt 213.8 lb

## 2018-02-08 DIAGNOSIS — B351 Tinea unguium: Secondary | ICD-10-CM

## 2018-02-08 DIAGNOSIS — Z23 Encounter for immunization: Secondary | ICD-10-CM | POA: Diagnosis not present

## 2018-02-08 DIAGNOSIS — L600 Ingrowing nail: Secondary | ICD-10-CM

## 2018-02-08 MED ORDER — ITRACONAZOLE 100 MG PO CAPS: 100.0000 mg | ORAL_CAPSULE | Freq: Every day | ORAL | 1 refills | Status: DC

## 2018-02-08 MED ORDER — SULFAMETHOXAZOLE-TRIMETHOPRIM 800-160 MG PO TABS: 1.0000 | ORAL_TABLET | Freq: Two times a day (BID) | ORAL | 0 refills | Status: DC

## 2018-02-08 NOTE — Progress Notes (Signed)
BP 134/85   Pulse (!) 101   Temp (!) 97.3 F (36.3 C) (Oral)   Ht 5\' 4"  (1.626 m)   Wt 213 lb 12.8 oz (97 kg)   BMI 36.70 kg/m    Subjective:    Patient ID: Alexandria Tucker, female    DOB: 1956/04/20, 62 y.o.   MRN: [id number]  HPI: Alexandria Tucker is a 62 y.o. female presenting on 02/08/2018 for Toe Pain (Patient states that the last 1 week she has had bilateral big toe nail pain when pushing on them. Wants to make sure they are not infected, not ingrown and she will be able to wear inclosed shoes next week on her trip. )   HPI Concern for ingrown toenail and thickened toenails Patient comes in today complaining of a concern for ingrown toenails and thickened toenails.  She has had bilateral big toenail pain on the edges when it touches the top of her toe and pushes down and she feels the pain on either side.  She denies any redness or warmth or drainage but wanted him come in and make sure they were not infected.  She has been fighting this issue off and on for some time but it really has come up over the past week  Relevant past medical, surgical, family and social history reviewed and updated as indicated. Interim medical history since our last visit reviewed. Allergies and medications reviewed and updated.  Review of Systems  Constitutional: Negative for chills and fever.  Eyes: Negative for visual disturbance.  Respiratory: Negative for chest tightness and shortness of breath.   Cardiovascular: Negative for chest pain and leg swelling.  Musculoskeletal: Negative for back pain and gait problem.  Skin: Negative for color change, rash and wound.  Neurological: Negative for light-headedness and headaches.  Psychiatric/Behavioral: Negative for agitation and behavioral problems.  All other systems reviewed and are negative.   Per HPI unless specifically indicated above   Allergies as of 02/08/2018      Reactions   Atorvastatin Other (See Comments)   Augmentin  [amoxicillin-pot Clavulanate] Other (See Comments)   Thrush   Niaspan [niacin Er] Other (See Comments)   Burning, itching skin.      Medication List        Accurate as of 02/08/18  4:12 PM. Always use your most recent med list.          aspirin EC 81 MG tablet Take 81 mg by mouth daily.   fexofenadine 180 MG tablet Commonly known as:  ALLEGRA Take 1 tablet (180 mg total) by mouth daily.   fluticasone 50 MCG/ACT nasal spray Commonly known as:  FLONASE One to 2 sprays each nostril at bedtime   hydrochlorothiazide 25 MG tablet Commonly known as:  HYDRODIURIL TAKE 1 TABLET BY MOUTH ONCE DAILY   itraconazole 100 MG capsule Commonly known as:  SPORANOX Take 1 capsule (100 mg total) by mouth daily.   JARDIANCE 10 MG Tabs tablet Generic drug:  empagliflozin TAKE 1 TABLET BY MOUTH ONCE DAILY   metFORMIN 1000 MG tablet Commonly known as:  GLUCOPHAGE TAKE 1 TABLET BY MOUTH TWICE DAILY WITH A MEAL   miconazole 2 % cream Commonly known as:  MICOTIN Apply 1 application topically 2 (two) times daily.   ramipril 10 MG capsule Commonly known as:  ALTACE TAKE 1 CAPSULE BY MOUTH ONCE DAILY   simvastatin 20 MG tablet Commonly known as:  ZOCOR TAKE 1 TABLET BY MOUTH ONCE DAILY AT BEDTIME  sulfamethoxazole-trimethoprim 800-160 MG tablet Commonly known as:  BACTRIM DS,SEPTRA DS Take 1 tablet by mouth 2 (two) times daily.          Objective:    BP 134/85   Pulse (!) 101   Temp (!) 97.3 F (36.3 C) (Oral)   Ht 5\' 4"  (1.626 m)   Wt 213 lb 12.8 oz (97 kg)   BMI 36.70 kg/m   Wt Readings from Last 3 Encounters:  02/08/18 213 lb 12.8 oz (97 kg)  09/29/17 216 lb (98 kg)  09/27/17 218 lb (98.9 kg)    Physical Exam  Constitutional: She is oriented to person, place, and time. She appears well-developed and well-nourished. No distress.  Eyes: Conjunctivae are normal.  Neurological: She is alert and oriented to person, place, and time. Coordination normal.  Skin: Skin is  warm and dry. No rash noted. She is not diaphoretic.  Thickened both great toenails and curved.  No tenderness or erythema on the edges around the toenails, only tenderness when you push on the top center of the toenail.  Psychiatric: She has a normal mood and affect. Her behavior is normal.  Nursing note and vitals reviewed.     Assessment & Plan:   Problem List Items Addressed This Visit    None    Visit Diagnoses    Onychomycosis    -  Primary   Relevant Medications   sulfamethoxazole-trimethoprim (BACTRIM DS,SEPTRA DS) 800-160 MG tablet   itraconazole (SPORANOX) 100 MG capsule   Ingrown toenail of both feet       Relevant Medications   sulfamethoxazole-trimethoprim (BACTRIM DS,SEPTRA DS) 800-160 MG tablet    Recommended wedging and sitz bath and will treat for onychomycosis  Follow up plan: Return in about 4 weeks (around 03/08/2018), or if symptoms worsen or fail to improve, for Recheck liver function with PCP.  Counseling provided for all of the vaccine components No orders of the defined types were placed in this encounter.   Caryl Pina, MD [facility] 02/08/2018, 4:12 PM

## 2018-02-11 ENCOUNTER — Other Ambulatory Visit: Payer: Self-pay | Admitting: Family

## 2018-03-08 ENCOUNTER — Other Ambulatory Visit: Payer: Self-pay | Admitting: Family

## 2018-03-08 ENCOUNTER — Telehealth: Payer: Self-pay | Admitting: Family

## 2018-03-08 ENCOUNTER — Encounter: Payer: Self-pay | Admitting: *Deleted

## 2018-03-08 DIAGNOSIS — I10 Essential (primary) hypertension: Secondary | ICD-10-CM

## 2018-03-18 ENCOUNTER — Other Ambulatory Visit: Payer: Self-pay | Admitting: Family

## 2018-03-18 DIAGNOSIS — I10 Essential (primary) hypertension: Secondary | ICD-10-CM

## 2018-03-20 ENCOUNTER — Other Ambulatory Visit: Payer: Self-pay | Admitting: Family

## 2018-03-20 DIAGNOSIS — I10 Essential (primary) hypertension: Principal | ICD-10-CM

## 2018-03-20 DIAGNOSIS — E785 Hyperlipidemia, unspecified: Secondary | ICD-10-CM

## 2018-03-20 DIAGNOSIS — E1159 Type 2 diabetes mellitus with other circulatory complications: Secondary | ICD-10-CM

## 2018-03-20 DIAGNOSIS — E119 Type 2 diabetes mellitus without complications: Secondary | ICD-10-CM

## 2018-03-20 DIAGNOSIS — I152 Hypertension secondary to endocrine disorders: Secondary | ICD-10-CM

## 2018-03-20 DIAGNOSIS — E1169 Type 2 diabetes mellitus with other specified complication: Secondary | ICD-10-CM

## 2018-04-01 ENCOUNTER — Other Ambulatory Visit: Payer: Self-pay | Admitting: Family

## 2018-04-03 ENCOUNTER — Ambulatory Visit: Payer: BLUE CROSS/BLUE SHIELD | Admitting: Family

## 2018-04-03 ENCOUNTER — Encounter: Payer: Self-pay | Admitting: Family

## 2018-04-03 VITALS — BP 108/67 | HR 82 | Temp 97.3°F | Ht 64.0 in | Wt 210.4 lb

## 2018-04-03 DIAGNOSIS — E785 Hyperlipidemia, unspecified: Secondary | ICD-10-CM | POA: Diagnosis not present

## 2018-04-03 DIAGNOSIS — I10 Essential (primary) hypertension: Secondary | ICD-10-CM

## 2018-04-03 DIAGNOSIS — E119 Type 2 diabetes mellitus without complications: Secondary | ICD-10-CM

## 2018-04-03 DIAGNOSIS — E1159 Type 2 diabetes mellitus with other circulatory complications: Secondary | ICD-10-CM

## 2018-04-03 DIAGNOSIS — R590 Localized enlarged lymph nodes: Secondary | ICD-10-CM | POA: Diagnosis not present

## 2018-04-03 DIAGNOSIS — I152 Hypertension secondary to endocrine disorders: Secondary | ICD-10-CM

## 2018-04-03 DIAGNOSIS — E1169 Type 2 diabetes mellitus with other specified complication: Secondary | ICD-10-CM

## 2018-04-03 DIAGNOSIS — Q740 Other congenital malformations of upper limb(s), including shoulder girdle: Secondary | ICD-10-CM | POA: Diagnosis not present

## 2018-04-03 LAB — BAYER DCA HB A1C WAIVED: HB A1C (BAYER DCA - WAIVED): 6.4 % (ref <7.0)

## 2018-04-03 NOTE — Patient Instructions (Signed)
Lymphadenopathy Lymphadenopathy refers to swollen or enlarged lymph glands, also called lymph nodes. Lymph glands are part of your body's defense (immune) system, which protects the body from infections, germs, and diseases. Lymph glands are found in many locations in your body, including the neck, underarm, and groin. Many things can cause lymph glands to become enlarged. When your immune system responds to germs, such as viruses or bacteria, infection-fighting cells and fluid build up. This causes the glands to grow in size. Usually, this is not something to worry about. The swelling and any soreness often go away without treatment. However, swollen lymph glands can also be caused by a number of diseases. Your health care provider may do various tests to help determine the cause. If the cause of your swollen lymph glands cannot be found, it is important to monitor your condition to make sure the swelling goes away. Follow these instructions at home: Watch your condition for any changes. The following actions may help to lessen any discomfort you are feeling:  Get plenty of rest.  Take medicines only as directed by your health care provider. Your health care provider may recommend over-the-counter medicines for pain.  Apply moist heat compresses to the site of swollen lymph nodes as directed by your health care provider. This can help reduce any pain.  Check your lymph nodes daily for any changes.  Keep all follow-up visits as directed by your health care provider. This is important.  Contact a health care provider if:  Your lymph nodes are still swollen after 2 weeks.  Your swelling increases or spreads to other areas.  Your lymph nodes are hard, seem fixed to the skin, or are growing rapidly.  Your skin over the lymph nodes is red and inflamed.  You have a fever.  You have chills.  You have fatigue.  You develop a sore throat.  You have abdominal pain.  You have weight  loss.  You have night sweats. Get help right away if:  You notice fluid leaking from the area of the enlarged lymph node.  You have severe pain in any area of your body.  You have chest pain.  You have shortness of breath. This information is not intended to replace advice given to you by your health care provider. Make sure you discuss any questions you have with your health care provider. Document Released: 01/26/2008 Document Revised: 09/24/2015 Document Reviewed: 11/21/2013 Elsevier Interactive Patient Education  2018 Elsevier Inc.  

## 2018-04-03 NOTE — Progress Notes (Signed)
   Subjective:    Patient ID: Alexandria Tucker, female    DOB: [birth year], 62 y.o.   MRN: [id number]  Chief Complaint  Patient presents with  . lump on upper chest    HPI PT presents to the office today with a "lump" on her right collar bone that she noticed three days ago. Denies any injury or recent sickness.   Denies any pain.    Review of Systems  All other systems reviewed and are negative.      Objective:   Physical Exam  Constitutional: She is oriented to person, place, and time. She appears well-developed and well-nourished. No distress.  HENT:  Head: Normocephalic and atraumatic.    Right Ear: External ear normal.  Left Ear: External ear normal.  Mouth/Throat: Oropharynx is clear and moist.  Eyes: Pupils are equal, round, and reactive to light.  Neck: Normal range of motion. Neck supple. No thyromegaly present.  Cardiovascular: Normal rate, regular rhythm, normal heart sounds and intact distal pulses.  No murmur heard. Pulmonary/Chest: Effort normal and breath sounds normal. No respiratory distress. She has no wheezes.  Abdominal: Soft. Bowel sounds are normal. She exhibits no distension. There is no tenderness.  Musculoskeletal: Normal range of motion. She exhibits no edema or tenderness.  Full ROM of neck and shoulder, right clavicle notch slightly protruding more than left side. No masses of lymph node felt. No tenderness present.    Lymphadenopathy:    She has cervical adenopathy.  Neurological: She is alert and oriented to person, place, and time. She has normal reflexes. No cranial nerve deficit.  Psychiatric: She has a normal mood and affect. Her behavior is normal. Judgment and thought content normal.  Vitals reviewed.     BP 108/67   Pulse 82   Temp (!) 97.3 F (36.3 C) (Oral)   Ht 5\' 4"  (1.626 m)   Wt 210 lb 6.4 oz (95.4 kg)   BMI 36.12 kg/m      Assessment & Plan:  Sybella A Main comes in today with chief complaint of lump on upper  chest   Diagnosis and orders addressed:  1. Cervical adenopathy Right cervical enlargement Rest Force fluids PT will get lab work drawn to day and look at WBC   2. Asymmetry of clavicles Right clavicle notch slightly protruding. No abnormal findings noted. Could be related to MSK? She has lost 6 lbs over the last 6 months.   Evelina Dun, FNP    Evelina Dun, [place]

## 2018-04-04 LAB — CMP14+EGFR
ALT: 34 IU/L — AB (ref 0–32)
AST: 26 IU/L (ref 0–40)
Albumin/Globulin Ratio: 1.9 (ref 1.2–2.2)
Albumin: 4.7 g/dL (ref 3.6–4.8)
Alkaline Phosphatase: 78 IU/L (ref 39–117)
BILIRUBIN TOTAL: 0.5 mg/dL (ref 0.0–1.2)
BUN/Creatinine Ratio: 23 (ref 12–28)
BUN: 17 mg/dL (ref 8–27)
CALCIUM: 10.1 mg/dL (ref 8.7–10.3)
CHLORIDE: 96 mmol/L (ref 96–106)
CO2: 24 mmol/L (ref 20–29)
Creatinine, Ser: 0.75 mg/dL (ref 0.57–1.00)
GFR calc non Af Amer: 87 mL/min/{1.73_m2} (ref >59)
GFR, EST AFRICAN AMERICAN: 100 mL/min/{1.73_m2} (ref >59)
GLUCOSE: 120 mg/dL — AB (ref 65–99)
Globulin, Total: 2.5 g/dL (ref 1.5–4.5)
Potassium: 4.3 mmol/L (ref 3.5–5.2)
Sodium: 137 mmol/L (ref 134–144)
TOTAL PROTEIN: 7.2 g/dL (ref 6.0–8.5)

## 2018-04-04 LAB — CBC WITH DIFFERENTIAL/PLATELET
BASOS: 1 %
Basophils Absolute: 0.1 10*3/uL (ref 0.0–0.2)
EOS (ABSOLUTE): 0.2 10*3/uL (ref 0.0–0.4)
Eos: 3 %
Hematocrit: 39.5 % (ref 34.0–46.6)
Hemoglobin: 13.2 g/dL (ref 11.1–15.9)
IMMATURE GRANS (ABS): 0 10*3/uL (ref 0.0–0.1)
Immature Granulocytes: 0 %
LYMPHS: 20 %
Lymphocytes Absolute: 1.7 10*3/uL (ref 0.7–3.1)
MCH: 27.4 pg (ref 26.6–33.0)
MCHC: 33.4 g/dL (ref 31.5–35.7)
MCV: 82 fL (ref 79–97)
MONOS ABS: 0.6 10*3/uL (ref 0.1–0.9)
Monocytes: 7 %
Neutrophils Absolute: 6 10*3/uL (ref 1.4–7.0)
Neutrophils: 69 %
Platelets: 327 10*3/uL (ref 150–450)
RBC: 4.81 x10E6/uL (ref 3.77–5.28)
RDW: 14.3 % (ref 12.3–15.4)
WBC: 8.6 10*3/uL (ref 3.4–10.8)

## 2018-04-04 LAB — LIPID PANEL
CHOL/HDL RATIO: 4.4 ratio (ref 0.0–4.4)
Cholesterol, Total: 153 mg/dL (ref 100–199)
HDL: 35 mg/dL — AB (ref >39)
LDL CALC: 78 mg/dL (ref 0–99)
TRIGLYCERIDES: 202 mg/dL — AB (ref 0–149)
VLDL CHOLESTEROL CAL: 40 mg/dL (ref 5–40)

## 2018-04-05 ENCOUNTER — Ambulatory Visit: Payer: BLUE CROSS/BLUE SHIELD | Admitting: Family

## 2018-04-06 ENCOUNTER — Ambulatory Visit: Payer: BLUE CROSS/BLUE SHIELD | Admitting: Family

## 2018-05-09 ENCOUNTER — Other Ambulatory Visit: Payer: Self-pay | Admitting: Family

## 2018-05-11 ENCOUNTER — Ambulatory Visit: Payer: BLUE CROSS/BLUE SHIELD | Admitting: Family

## 2018-05-11 ENCOUNTER — Encounter: Payer: Self-pay | Admitting: Family

## 2018-05-11 VITALS — BP 123/70 | HR 81 | Temp 96.7°F | Ht 64.0 in | Wt 212.0 lb

## 2018-05-11 DIAGNOSIS — E119 Type 2 diabetes mellitus without complications: Secondary | ICD-10-CM

## 2018-05-11 DIAGNOSIS — E1169 Type 2 diabetes mellitus with other specified complication: Secondary | ICD-10-CM

## 2018-05-11 DIAGNOSIS — I10 Essential (primary) hypertension: Secondary | ICD-10-CM

## 2018-05-11 DIAGNOSIS — E1159 Type 2 diabetes mellitus with other circulatory complications: Secondary | ICD-10-CM | POA: Diagnosis not present

## 2018-05-11 DIAGNOSIS — I152 Hypertension secondary to endocrine disorders: Secondary | ICD-10-CM

## 2018-05-11 DIAGNOSIS — E785 Hyperlipidemia, unspecified: Secondary | ICD-10-CM

## 2018-05-11 MED ORDER — HYDROCHLOROTHIAZIDE 25 MG PO TABS: 25.0000 mg | ORAL_TABLET | Freq: Every day | ORAL | 3 refills | Status: DC

## 2018-05-11 MED ORDER — RAMIPRIL 10 MG PO CAPS: 10.0000 mg | ORAL_CAPSULE | Freq: Every day | ORAL | 3 refills | Status: DC

## 2018-05-11 MED ORDER — DOXYCYCLINE HYCLATE 100 MG PO TABS: 100.0000 mg | ORAL_TABLET | Freq: Two times a day (BID) | ORAL | 0 refills | Status: DC

## 2018-05-11 MED ORDER — METFORMIN HCL 1000 MG PO TABS: ORAL_TABLET | ORAL | 1 refills | Status: DC

## 2018-05-11 MED ORDER — EMPAGLIFLOZIN 10 MG PO TABS: 10.0000 mg | ORAL_TABLET | Freq: Every day | ORAL | 3 refills | Status: DC

## 2018-05-11 MED ORDER — SIMVASTATIN 20 MG PO TABS: 20.0000 mg | ORAL_TABLET | Freq: Every day | ORAL | 3 refills | Status: DC

## 2018-05-11 NOTE — Progress Notes (Addendum)
Subjective:    Patient ID: Alexandria Tucker, female    DOB: [birth year], 63 y.o.   MRN: [id number]  Chief Complaint  Patient presents with  . Medical Management of Chronic Issues    six month recheck   PT presents to the office today for chronic follow up.  Diabetes  She presents for her follow-up diabetic visit. She has type 2 diabetes mellitus. Her disease course has been stable. There are no hypoglycemic associated symptoms. Associated symptoms include foot paresthesias. Pertinent negatives for diabetes include no blurred vision, no foot ulcerations and no visual change. There are no hypoglycemic complications. Symptoms are stable. Diabetic complications include peripheral neuropathy. Risk factors for coronary artery disease include diabetes mellitus, dyslipidemia and hypertension. She is following a diabetic diet. (Does not check her blood glucose at home)  Hypertension  This is a chronic problem. The current episode started more than 1 year ago. The problem has been resolved since onset. The problem is controlled. Pertinent negatives include no blurred vision, peripheral edema or shortness of breath. Risk factors for coronary artery disease include diabetes mellitus, dyslipidemia, obesity and female gender. The current treatment provides moderate improvement. There is no history of kidney disease, CAD/MI or heart failure.  Hyperlipidemia  This is a chronic problem. The current episode started more than 1 year ago. The problem is controlled. Recent lipid tests were reviewed and are normal. Exacerbating diseases include obesity. Pertinent negatives include no shortness of breath. Current antihyperlipidemic treatment includes statins. The current treatment provides moderate improvement of lipids. Risk factors for coronary artery disease include diabetes mellitus and dyslipidemia.      Review of Systems  Eyes: Negative for blurred vision.  Respiratory: Negative for shortness of breath.   All  other systems reviewed and are negative.      Objective:   Physical Exam Vitals signs reviewed.  Constitutional:      General: She is not in acute distress.    Appearance: She is well-developed.  HENT:     Head: Normocephalic and atraumatic.     Right Ear: Tympanic membrane normal.     Left Ear: Tympanic membrane normal.  Eyes:     Pupils: Pupils are equal, round, and reactive to light.  Neck:     Musculoskeletal: Normal range of motion and neck supple.     Thyroid: No thyromegaly.  Cardiovascular:     Rate and Rhythm: Normal rate and regular rhythm.     Heart sounds: Normal heart sounds. No murmur.  Pulmonary:     Effort: Pulmonary effort is normal. No respiratory distress.     Breath sounds: Normal breath sounds. No wheezing.  Abdominal:     General: Bowel sounds are normal. There is no distension.     Palpations: Abdomen is soft.     Tenderness: There is no abdominal tenderness.  Musculoskeletal: Normal range of motion.        General: No tenderness.  Skin:    General: Skin is warm and dry.  Neurological:     Mental Status: She is alert and oriented to person, place, and time.     Cranial Nerves: No cranial nerve deficit.     Deep Tendon Reflexes: Reflexes are normal and symmetric.  Psychiatric:        Behavior: Behavior normal.        Thought Content: Thought content normal.        Judgment: Judgment normal.       BP 123/70   Pulse  81   Temp (!) 96.7 F (35.9 C) (Oral)   Ht 5\' 4"  (1.626 m)   Wt 212 lb (96.2 kg)   BMI 36.39 kg/m      Assessment & Plan:  Alexandria Tucker comes in today with chief complaint of Medical Management of Chronic Issues (six month recheck)   Diagnosis and orders addressed:  1. Type 2 diabetes mellitus without complication, without long-term current use of insulin (HCC) - Microalbumin / creatinine urine ratio - empagliflozin (JARDIANCE) 10 MG TABS tablet; Take 10 mg by mouth daily.  Dispense: 90 tablet; Refill: 3 - metFORMIN  (GLUCOPHAGE) 1000 MG tablet; TAKE 1 TABLET BY MOUTH TWICE DAILY WITH A MEAL  Dispense: 180 tablet; Refill: 1  2. Hypertension associated with diabetes ([place]) - hydrochlorothiazide (HYDRODIURIL) 25 MG tablet; Take 1 tablet (25 mg total) by mouth daily.  Dispense: 90 tablet; Refill: 3  3. Hyperlipidemia associated with type 2 diabetes mellitus (HCC) - simvastatin (ZOCOR) 20 MG tablet; Take 1 tablet (20 mg total) by mouth at bedtime.  Dispense: 90 tablet; Refill: 3  4. Morbid obesity ([place])  5. Essential hypertension - hydrochlorothiazide (HYDRODIURIL) 25 MG tablet; Take 1 tablet (25 mg total) by mouth daily.  Dispense: 90 tablet; Refill: 3 - ramipril (ALTACE) 10 MG capsule; Take 1 capsule (10 mg total) by mouth daily.  Dispense: 90 capsule; Refill: 3   Labs reviewed Health Maintenance reviewed Diet and exercise encouraged  Follow up plan: 6 months    Evelina Dun, FNP

## 2018-05-11 NOTE — Patient Instructions (Signed)
Diabetes Mellitus and Nutrition, Adult  When you have diabetes (diabetes mellitus), it is very important to have healthy eating habits because your blood sugar (glucose) levels are greatly affected by what you eat and drink. Eating healthy foods in the appropriate amounts, at about the same times every day, can help you:  · Control your blood glucose.  · Lower your risk of heart disease.  · Improve your blood pressure.  · Reach or maintain a healthy weight.  Every person with diabetes is different, and each person has different needs for a meal plan. Your health care provider may recommend that you work with a diet and nutrition specialist (dietitian) to make a meal plan that is best for you. Your meal plan may vary depending on factors such as:  · The calories you need.  · The medicines you take.  · Your weight.  · Your blood glucose, blood pressure, and cholesterol levels.  · Your activity level.  · Other health conditions you have, such as heart or kidney disease.  How do carbohydrates affect me?  Carbohydrates, also called carbs, affect your blood glucose level more than any other type of food. Eating carbs naturally raises the amount of glucose in your blood. Carb counting is a method for keeping track of how many carbs you eat. Counting carbs is important to keep your blood glucose at a healthy level, especially if you use insulin or take certain oral diabetes medicines.  It is important to know how many carbs you can safely have in each meal. This is different for every person. Your dietitian can help you calculate how many carbs you should have at each meal and for each snack.  Foods that contain carbs include:  · Bread, cereal, rice, pasta, and crackers.  · Potatoes and corn.  · Peas, beans, and lentils.  · Milk and yogurt.  · Fruit and juice.  · Desserts, such as cakes, cookies, ice cream, and candy.  How does alcohol affect me?  Alcohol can cause a sudden decrease in blood glucose (hypoglycemia),  especially if you use insulin or take certain oral diabetes medicines. Hypoglycemia can be a life-threatening condition. Symptoms of hypoglycemia (sleepiness, dizziness, and confusion) are similar to symptoms of having too much alcohol.  If your health care provider says that alcohol is safe for you, follow these guidelines:  · Limit alcohol intake to no more than 1 drink per day for nonpregnant women and 2 drinks per day for men. One drink equals 12 oz of beer, 5 oz of wine, or 1½ oz of hard liquor.  · Do not drink on an empty stomach.  · Keep yourself hydrated with water, diet soda, or unsweetened iced tea.  · Keep in mind that regular soda, juice, and other mixers may contain a lot of sugar and must be counted as carbs.  What are tips for following this plan?    Reading food labels  · Start by checking the serving size on the "Nutrition Facts" label of packaged foods and drinks. The amount of calories, carbs, fats, and other nutrients listed on the label is based on one serving of the item. Many items contain more than one serving per package.  · Check the total grams (g) of carbs in one serving. You can calculate the number of servings of carbs in one serving by dividing the total carbs by 15. For example, if a food has 30 g of total carbs, it would be equal to 2   servings of carbs.  · Check the number of grams (g) of saturated and trans fats in one serving. Choose foods that have low or no amount of these fats.  · Check the number of milligrams (mg) of salt (sodium) in one serving. Most people should limit total sodium intake to less than 2,300 mg per day.  · Always check the nutrition information of foods labeled as "low-fat" or "nonfat". These foods may be higher in added sugar or refined carbs and should be avoided.  · Talk to your dietitian to identify your daily goals for nutrients listed on the label.  Shopping  · Avoid buying canned, premade, or processed foods. These foods tend to be high in fat, sodium,  and added sugar.  · Shop around the outside edge of the grocery store. This includes fresh fruits and vegetables, bulk grains, fresh meats, and fresh dairy.  Cooking  · Use low-heat cooking methods, such as baking, instead of high-heat cooking methods like deep frying.  · Cook using healthy oils, such as olive, canola, or sunflower oil.  · Avoid cooking with butter, cream, or high-fat meats.  Meal planning  · Eat meals and snacks regularly, preferably at the same times every day. Avoid going long periods of time without eating.  · Eat foods high in fiber, such as fresh fruits, vegetables, beans, and whole grains. Talk to your dietitian about how many servings of carbs you can eat at each meal.  · Eat 4-6 ounces (oz) of lean protein each day, such as lean meat, chicken, fish, eggs, or tofu. One oz of lean protein is equal to:  ? 1 oz of meat, chicken, or fish.  ? 1 egg.  ? ¼ cup of tofu.  · Eat some foods each day that contain healthy fats, such as avocado, nuts, seeds, and fish.  Lifestyle  · Check your blood glucose regularly.  · Exercise regularly as told by your health care provider. This may include:  ? 150 minutes of moderate-intensity or vigorous-intensity exercise each week. This could be brisk walking, biking, or water aerobics.  ? Stretching and doing strength exercises, such as yoga or weightlifting, at least 2 times a week.  · Take medicines as told by your health care provider.  · Do not use any products that contain nicotine or tobacco, such as cigarettes and e-cigarettes. If you need help quitting, ask your health care provider.  · Work with a counselor or diabetes educator to identify strategies to manage stress and any emotional and social challenges.  Questions to ask a health care provider  · Do I need to meet with a diabetes educator?  · Do I need to meet with a dietitian?  · What number can I call if I have questions?  · When are the best times to check my blood glucose?  Where to find more  information:  · American Diabetes Association: diabetes.org  · Academy of Nutrition and Dietetics: www.eatright.org  · National Institute of Diabetes and Digestive and Kidney Diseases (NIH): www.niddk.nih.gov  Summary  · A healthy meal plan will help you control your blood glucose and maintain a healthy lifestyle.  · Working with a diet and nutrition specialist (dietitian) can help you make a meal plan that is best for you.  · Keep in mind that carbohydrates (carbs) and alcohol have immediate effects on your blood glucose levels. It is important to count carbs and to use alcohol carefully.  This information is not intended to   replace advice given to you by your health care provider. Make sure you discuss any questions you have with your health care provider.  Document Released: 01/13/2005 Document Revised: 11/16/2016 Document Reviewed: 05/23/2016  Elsevier Interactive Patient Education © 2019 [employer]

## 2018-05-11 NOTE — Addendum Note (Signed)
Addended by: Evelina Dun A on: 05/11/2018 09:06 AM   Modules accepted: Orders

## 2018-05-12 LAB — MICROALBUMIN / CREATININE URINE RATIO
Creatinine, Urine: 63.4 mg/dL
Microalb/Creat Ratio: 10.3 mg/g creat (ref 0.0–30.0)
Microalbumin, Urine: 6.5 ug/mL

## 2018-11-27 ENCOUNTER — Ambulatory Visit: Payer: BLUE CROSS/BLUE SHIELD | Admitting: Family

## 2018-12-07 ENCOUNTER — Ambulatory Visit (INDEPENDENT_AMBULATORY_CARE_PROVIDER_SITE_OTHER): Payer: BC Managed Care – PPO | Admitting: Family

## 2018-12-07 ENCOUNTER — Encounter: Payer: Self-pay | Admitting: Family

## 2018-12-07 DIAGNOSIS — E785 Hyperlipidemia, unspecified: Secondary | ICD-10-CM

## 2018-12-07 DIAGNOSIS — J301 Allergic rhinitis due to pollen: Secondary | ICD-10-CM | POA: Diagnosis not present

## 2018-12-07 DIAGNOSIS — E1159 Type 2 diabetes mellitus with other circulatory complications: Secondary | ICD-10-CM | POA: Diagnosis not present

## 2018-12-07 DIAGNOSIS — I152 Hypertension secondary to endocrine disorders: Secondary | ICD-10-CM

## 2018-12-07 DIAGNOSIS — E1169 Type 2 diabetes mellitus with other specified complication: Secondary | ICD-10-CM | POA: Diagnosis not present

## 2018-12-07 DIAGNOSIS — I10 Essential (primary) hypertension: Secondary | ICD-10-CM

## 2018-12-07 NOTE — Addendum Note (Signed)
Addended by: Evelina Dun A on: 12/07/2018 08:42 AM   Modules accepted: Orders

## 2018-12-07 NOTE — Progress Notes (Signed)
Virtual Visit via telephone Note Due to COVID-19 pandemic this visit was conducted virtually. This visit type was conducted due to national recommendations for restrictions regarding the COVID-19 Pandemic (e.g. social distancing, sheltering in place) in an effort to limit this patient's exposure and mitigate transmission in our community. All issues noted in this document were discussed and addressed.  A physical exam was not performed with this format.  I connected with Alexandria Tucker on 12/07/18 at 8:08  AM by telephone and verified that I am speaking with the correct person using two identifiers. Alexandria Tucker is currently located at home  and husband is currently with her during visit. The provider, Evelina Dun, FNP is located in their office at time of visit.  I discussed the limitations, risks, security and privacy concerns of performing an evaluation and management service by telephone and the availability of in person appointments. I also discussed with the patient that there may be a patient responsible charge related to this service. The patient expressed understanding and agreed to proceed.   History and Present Illness:  Pt calls the office today for chronic follow up.  Diabetes She presents for her follow-up diabetic visit. She has type 2 diabetes mellitus. There are no hypoglycemic associated symptoms. Pertinent negatives for diabetes include no blurred vision, no foot paresthesias and no visual change. Pertinent negatives for hypoglycemia complications include no blackouts and no hospitalization. Symptoms are stable. Pertinent negatives for diabetic complications include no CVA, heart disease, nephropathy or peripheral neuropathy. Risk factors for coronary artery disease include dyslipidemia, diabetes mellitus, hypertension, sedentary lifestyle and post-menopausal. (Does not check BS at home) Eye exam is current.  Hypertension This is a chronic problem. The current episode  started more than 1 year ago. The problem has been resolved since onset. The problem is controlled. Pertinent negatives include no blurred vision, malaise/fatigue, peripheral edema or shortness of breath. Risk factors for coronary artery disease include dyslipidemia, diabetes mellitus and sedentary lifestyle. The current treatment provides moderate improvement. There is no history of CVA.  Hyperlipidemia This is a chronic problem. The current episode started more than 1 year ago. The problem is controlled. Recent lipid tests were reviewed and are normal. Exacerbating diseases include obesity. Pertinent negatives include no shortness of breath. Current antihyperlipidemic treatment includes statins. The current treatment provides moderate improvement of lipids. Risk factors for coronary artery disease include dyslipidemia, hypertension, a sedentary lifestyle and post-menopausal.      Review of Systems  Constitutional: Negative for malaise/fatigue.  Eyes: Negative for blurred vision.  Respiratory: Negative for shortness of breath.   All other systems reviewed and are negative.    Observations/Objective: No SOB or distress noted   Assessment and Plan: 1. Hypertension associated with diabetes ([place]) - CMP14+EGFR - CBC with Differential/Platelet  2. Allergic rhinitis due to pollen, unspecified seasonality - CMP14+EGFR - CBC with Differential/Platelet  3. Hyperlipidemia associated with type 2 diabetes mellitus ([facility]) - CMP14+EGFR - CBC with Differential/Platelet  4. Type 2 diabetes mellitus with other specified complication, without long-term current use of insulin ([facility]) - CMP14+EGFR - CBC with Differential/Platelet - Bayer DCA Hb A1c Waived  5. Morbid obesity ([place]) - CMP14+EGFR - CBC with Differential/Platelet    Labs pending Health Maintenance discussed Follow up in 3 months  I discussed the assessment and treatment plan with the patient. The patient was provided an opportunity  to ask questions and all were answered. The patient agreed with the plan and demonstrated an understanding of the  instructions.   The patient was advised to call back or seek an in-person evaluation if the symptoms worsen or if the condition fails to improve as anticipated.  The above assessment and management plan was discussed with the patient. The patient verbalized understanding of and has agreed to the management plan. Patient is aware to call the clinic if symptoms persist or worsen. Patient is aware when to return to the clinic for a follow-up visit. Patient educated on when it is appropriate to go to the emergency department.   Time call ended:  8:22 AM  I provided 14 minutes of non-face-to-face time during this encounter.    Evelina Dun, FNP

## 2018-12-14 ENCOUNTER — Other Ambulatory Visit: Payer: Self-pay

## 2018-12-14 ENCOUNTER — Ambulatory Visit (INDEPENDENT_AMBULATORY_CARE_PROVIDER_SITE_OTHER): Payer: BC Managed Care – PPO | Admitting: *Deleted

## 2018-12-14 ENCOUNTER — Other Ambulatory Visit: Payer: BC Managed Care – PPO

## 2018-12-14 DIAGNOSIS — I10 Essential (primary) hypertension: Secondary | ICD-10-CM | POA: Diagnosis not present

## 2018-12-14 DIAGNOSIS — E785 Hyperlipidemia, unspecified: Secondary | ICD-10-CM | POA: Diagnosis not present

## 2018-12-14 DIAGNOSIS — E1159 Type 2 diabetes mellitus with other circulatory complications: Secondary | ICD-10-CM | POA: Diagnosis not present

## 2018-12-14 DIAGNOSIS — Z23 Encounter for immunization: Secondary | ICD-10-CM

## 2018-12-14 DIAGNOSIS — J301 Allergic rhinitis due to pollen: Secondary | ICD-10-CM | POA: Diagnosis not present

## 2018-12-14 DIAGNOSIS — E1169 Type 2 diabetes mellitus with other specified complication: Secondary | ICD-10-CM | POA: Diagnosis not present

## 2018-12-14 LAB — BAYER DCA HB A1C WAIVED: HB A1C (BAYER DCA - WAIVED): 6.9 % (ref <7.0)

## 2018-12-14 NOTE — Progress Notes (Signed)
Pt given Shingrix vaccine Tolerated well 

## 2018-12-14 NOTE — Addendum Note (Signed)
Addended by: Liliane Bade on: 12/14/2018 08:07 AM   Modules accepted: Orders

## 2018-12-15 LAB — LIPID PANEL
Chol/HDL Ratio: 4.7 ratio — ABNORMAL HIGH (ref 0.0–4.4)
Cholesterol, Total: 155 mg/dL (ref 100–199)
HDL: 33 mg/dL — ABNORMAL LOW (ref >39)
LDL Calculated: 84 mg/dL (ref 0–99)
Triglycerides: 189 mg/dL — ABNORMAL HIGH (ref 0–149)
VLDL Cholesterol Cal: 38 mg/dL (ref 5–40)

## 2018-12-15 LAB — CMP14+EGFR
ALT: 42 IU/L — ABNORMAL HIGH (ref 0–32)
AST: 31 IU/L (ref 0–40)
Albumin/Globulin Ratio: 1.8 (ref 1.2–2.2)
Albumin: 4.6 g/dL (ref 3.8–4.8)
Alkaline Phosphatase: 74 IU/L (ref 39–117)
BUN/Creatinine Ratio: 26 (ref 12–28)
BUN: 18 mg/dL (ref 8–27)
Bilirubin Total: 0.3 mg/dL (ref 0.0–1.2)
CO2: 24 mmol/L (ref 20–29)
Calcium: 9.8 mg/dL (ref 8.7–10.3)
Chloride: 99 mmol/L (ref 96–106)
Creatinine, Ser: 0.69 mg/dL (ref 0.57–1.00)
GFR calc Af Amer: 109 mL/min/{1.73_m2} (ref >59)
GFR calc non Af Amer: 94 mL/min/{1.73_m2} (ref >59)
Globulin, Total: 2.6 g/dL (ref 1.5–4.5)
Glucose: 134 mg/dL — ABNORMAL HIGH (ref 65–99)
Potassium: 4.3 mmol/L (ref 3.5–5.2)
Sodium: 140 mmol/L (ref 134–144)
Total Protein: 7.2 g/dL (ref 6.0–8.5)

## 2018-12-15 LAB — CBC WITH DIFFERENTIAL/PLATELET
Basophils Absolute: 0.1 10*3/uL (ref 0.0–0.2)
Basos: 1 %
EOS (ABSOLUTE): 0.3 10*3/uL (ref 0.0–0.4)
Eos: 4 %
Hematocrit: 36.7 % (ref 34.0–46.6)
Hemoglobin: 12.2 g/dL (ref 11.1–15.9)
Immature Grans (Abs): 0 10*3/uL (ref 0.0–0.1)
Immature Granulocytes: 1 %
Lymphocytes Absolute: 1.8 10*3/uL (ref 0.7–3.1)
Lymphs: 23 %
MCH: 27.7 pg (ref 26.6–33.0)
MCHC: 33.2 g/dL (ref 31.5–35.7)
MCV: 83 fL (ref 79–97)
Monocytes Absolute: 0.5 10*3/uL (ref 0.1–0.9)
Monocytes: 7 %
Neutrophils Absolute: 5.4 10*3/uL (ref 1.4–7.0)
Neutrophils: 64 %
Platelets: 335 10*3/uL (ref 150–450)
RBC: 4.4 x10E6/uL (ref 3.77–5.28)
RDW: 14.5 % (ref 11.7–15.4)
WBC: 8.2 10*3/uL (ref 3.4–10.8)

## 2018-12-17 ENCOUNTER — Telehealth: Payer: Self-pay | Admitting: *Deleted

## 2018-12-17 ENCOUNTER — Other Ambulatory Visit: Payer: Self-pay | Admitting: Family

## 2018-12-17 NOTE — Telephone Encounter (Signed)
Aware of lab results  

## 2019-01-17 ENCOUNTER — Other Ambulatory Visit: Payer: Self-pay | Admitting: Family

## 2019-01-17 DIAGNOSIS — E119 Type 2 diabetes mellitus without complications: Secondary | ICD-10-CM

## 2019-02-08 ENCOUNTER — Ambulatory Visit (INDEPENDENT_AMBULATORY_CARE_PROVIDER_SITE_OTHER): Payer: BC Managed Care – PPO

## 2019-02-08 DIAGNOSIS — Z23 Encounter for immunization: Secondary | ICD-10-CM

## 2019-03-15 DIAGNOSIS — Z01419 Encounter for gynecological examination (general) (routine) without abnormal findings: Secondary | ICD-10-CM | POA: Diagnosis not present

## 2019-03-15 DIAGNOSIS — Z1231 Encounter for screening mammogram for malignant neoplasm of breast: Secondary | ICD-10-CM | POA: Diagnosis not present

## 2019-03-15 DIAGNOSIS — Z6836 Body mass index (BMI) 36.0-36.9, adult: Secondary | ICD-10-CM | POA: Diagnosis not present

## 2019-05-15 ENCOUNTER — Telehealth: Payer: Self-pay | Admitting: Family

## 2019-05-15 NOTE — Telephone Encounter (Signed)
Notified patient that she is due for her second shingrix vaccine and she could have this done at her follow up appt in February. She states that she has changed insurances and she will check with them before the appt for price.

## 2019-06-01 ENCOUNTER — Other Ambulatory Visit: Payer: Self-pay | Admitting: Family

## 2019-06-01 DIAGNOSIS — I10 Essential (primary) hypertension: Secondary | ICD-10-CM

## 2019-06-01 DIAGNOSIS — I152 Hypertension secondary to endocrine disorders: Secondary | ICD-10-CM

## 2019-06-01 DIAGNOSIS — E1159 Type 2 diabetes mellitus with other circulatory complications: Secondary | ICD-10-CM

## 2019-06-07 ENCOUNTER — Encounter: Payer: Self-pay | Admitting: Family

## 2019-06-07 ENCOUNTER — Telehealth: Payer: Self-pay | Admitting: *Deleted

## 2019-06-07 ENCOUNTER — Ambulatory Visit: Payer: 59 | Admitting: Family

## 2019-06-07 ENCOUNTER — Ambulatory Visit (INDEPENDENT_AMBULATORY_CARE_PROVIDER_SITE_OTHER): Payer: 59

## 2019-06-07 ENCOUNTER — Other Ambulatory Visit: Payer: Self-pay

## 2019-06-07 VITALS — BP 125/75 | HR 80 | Temp 98.6°F | Ht 64.0 in | Wt 206.6 lb

## 2019-06-07 DIAGNOSIS — M25511 Pain in right shoulder: Secondary | ICD-10-CM

## 2019-06-07 DIAGNOSIS — E1159 Type 2 diabetes mellitus with other circulatory complications: Secondary | ICD-10-CM

## 2019-06-07 DIAGNOSIS — I152 Hypertension secondary to endocrine disorders: Secondary | ICD-10-CM

## 2019-06-07 DIAGNOSIS — J301 Allergic rhinitis due to pollen: Secondary | ICD-10-CM

## 2019-06-07 DIAGNOSIS — Z23 Encounter for immunization: Secondary | ICD-10-CM | POA: Diagnosis not present

## 2019-06-07 DIAGNOSIS — M89319 Hypertrophy of bone, unspecified shoulder: Secondary | ICD-10-CM | POA: Diagnosis not present

## 2019-06-07 DIAGNOSIS — E119 Type 2 diabetes mellitus without complications: Secondary | ICD-10-CM

## 2019-06-07 DIAGNOSIS — E1169 Type 2 diabetes mellitus with other specified complication: Secondary | ICD-10-CM | POA: Diagnosis not present

## 2019-06-07 DIAGNOSIS — E785 Hyperlipidemia, unspecified: Secondary | ICD-10-CM

## 2019-06-07 DIAGNOSIS — I10 Essential (primary) hypertension: Secondary | ICD-10-CM | POA: Diagnosis not present

## 2019-06-07 DIAGNOSIS — B351 Tinea unguium: Secondary | ICD-10-CM

## 2019-06-07 LAB — LIPID PANEL
Chol/HDL Ratio: 4.4 ratio (ref 0.0–4.4)
Cholesterol, Total: 163 mg/dL (ref 100–199)
HDL: 37 mg/dL — ABNORMAL LOW (ref >39)
LDL Chol Calc (NIH): 93 mg/dL (ref 0–99)
Triglycerides: 189 mg/dL — ABNORMAL HIGH (ref 0–149)
VLDL Cholesterol Cal: 33 mg/dL (ref 5–40)

## 2019-06-07 LAB — CMP14+EGFR
ALT: 33 IU/L — ABNORMAL HIGH (ref 0–32)
AST: 26 IU/L (ref 0–40)
Albumin/Globulin Ratio: 2 (ref 1.2–2.2)
Albumin: 4.9 g/dL — ABNORMAL HIGH (ref 3.8–4.8)
Alkaline Phosphatase: 82 IU/L (ref 39–117)
BUN/Creatinine Ratio: 26 (ref 12–28)
BUN: 18 mg/dL (ref 8–27)
Bilirubin Total: 0.4 mg/dL (ref 0.0–1.2)
CO2: 24 mmol/L (ref 20–29)
Calcium: 10 mg/dL (ref 8.7–10.3)
Chloride: 99 mmol/L (ref 96–106)
Creatinine, Ser: 0.68 mg/dL (ref 0.57–1.00)
GFR calc Af Amer: 109 mL/min/{1.73_m2} (ref >59)
GFR calc non Af Amer: 95 mL/min/{1.73_m2} (ref >59)
Globulin, Total: 2.4 g/dL (ref 1.5–4.5)
Glucose: 126 mg/dL — ABNORMAL HIGH (ref 65–99)
Potassium: 4.1 mmol/L (ref 3.5–5.2)
Sodium: 139 mmol/L (ref 134–144)
Total Protein: 7.3 g/dL (ref 6.0–8.5)

## 2019-06-07 LAB — CBC WITH DIFFERENTIAL/PLATELET
Basophils Absolute: 0.1 10*3/uL (ref 0.0–0.2)
Basos: 1 %
EOS (ABSOLUTE): 0.3 10*3/uL (ref 0.0–0.4)
Eos: 3 %
Hematocrit: 40.1 % (ref 34.0–46.6)
Hemoglobin: 13.1 g/dL (ref 11.1–15.9)
Immature Grans (Abs): 0 10*3/uL (ref 0.0–0.1)
Immature Granulocytes: 0 %
Lymphocytes Absolute: 2.1 10*3/uL (ref 0.7–3.1)
Lymphs: 21 %
MCH: 27.8 pg (ref 26.6–33.0)
MCHC: 32.7 g/dL (ref 31.5–35.7)
MCV: 85 fL (ref 79–97)
Monocytes Absolute: 0.7 10*3/uL (ref 0.1–0.9)
Monocytes: 7 %
Neutrophils Absolute: 6.9 10*3/uL (ref 1.4–7.0)
Neutrophils: 68 %
Platelets: 353 10*3/uL (ref 150–450)
RBC: 4.71 x10E6/uL (ref 3.77–5.28)
RDW: 14.9 % (ref 11.7–15.4)
WBC: 10.1 10*3/uL (ref 3.4–10.8)

## 2019-06-07 LAB — BAYER DCA HB A1C WAIVED: HB A1C (BAYER DCA - WAIVED): 6.6 % (ref <7.0)

## 2019-06-07 MED ORDER — RAMIPRIL 10 MG PO CAPS: 10.0000 mg | ORAL_CAPSULE | Freq: Every day | ORAL | 3 refills | Status: DC

## 2019-06-07 MED ORDER — JARDIANCE 10 MG PO TABS: 10.0000 mg | ORAL_TABLET | Freq: Every day | ORAL | 3 refills | Status: DC

## 2019-06-07 MED ORDER — METFORMIN HCL 1000 MG PO TABS: 1000.0000 mg | ORAL_TABLET | Freq: Two times a day (BID) | ORAL | 1 refills | Status: DC

## 2019-06-07 MED ORDER — SIMVASTATIN 20 MG PO TABS: 20.0000 mg | ORAL_TABLET | Freq: Every day | ORAL | 3 refills | Status: DC

## 2019-06-07 MED ORDER — ITRACONAZOLE 100 MG PO CAPS: 100.0000 mg | ORAL_CAPSULE | Freq: Every day | ORAL | 1 refills | Status: DC

## 2019-06-07 MED ORDER — HYDROCHLOROTHIAZIDE 25 MG PO TABS: 25.0000 mg | ORAL_TABLET | Freq: Every day | ORAL | 2 refills | Status: DC

## 2019-06-07 NOTE — Telephone Encounter (Signed)
Itraconazole 100mg  capsule needs PA-what is the dx?

## 2019-06-07 NOTE — Patient Instructions (Signed)
Health Maintenance, Female Adopting a healthy lifestyle and getting preventive care are important in promoting health and wellness. Ask your health care provider about:  The right schedule for you to have regular tests and exams.  Things you can do on your own to prevent diseases and keep yourself healthy. What should I know about diet, weight, and exercise? Eat a healthy diet   Eat a diet that includes plenty of vegetables, fruits, low-fat dairy products, and lean protein.  Do not eat a lot of foods that are high in solid fats, added sugars, or sodium. Maintain a healthy weight Body mass index (BMI) is used to identify weight problems. It estimates body fat based on height and weight. Your health care provider can help determine your BMI and help you achieve or maintain a healthy weight. Get regular exercise Get regular exercise. This is one of the most important things you can do for your health. Most adults should:  Exercise for at least 150 minutes each week. The exercise should increase your heart rate and make you sweat (moderate-intensity exercise).  Do strengthening exercises at least twice a week. This is in addition to the moderate-intensity exercise.  Spend less time sitting. Even light physical activity can be beneficial. Watch cholesterol and blood lipids Have your blood tested for lipids and cholesterol at 62 years of age, then have this test every 5 years. Have your cholesterol levels checked more often if:  Your lipid or cholesterol levels are high.  You are older than 62 years of age.  You are at high risk for heart disease. What should I know about cancer screening? Depending on your health history and family history, you may need to have cancer screening at various ages. This may include screening for:  Breast cancer.  Cervical cancer.  Colorectal cancer.  Skin cancer.  Lung cancer. What should I know about heart disease, diabetes, and high blood  pressure? Blood pressure and heart disease  High blood pressure causes heart disease and increases the risk of stroke. This is more likely to develop in people who have high blood pressure readings, are of African descent, or are overweight.  Have your blood pressure checked: ? Every 3-5 years if you are 18-39 years of age. ? Every year if you are 40 years old or older. Diabetes Have regular diabetes screenings. This checks your fasting blood sugar level. Have the screening done:  Once every three years after age 40 if you are at a normal weight and have a low risk for diabetes.  More often and at a younger age if you are overweight or have a high risk for diabetes. What should I know about preventing infection? Hepatitis B If you have a higher risk for hepatitis B, you should be screened for this virus. Talk with your health care provider to find out if you are at risk for hepatitis B infection. Hepatitis C Testing is recommended for:  Everyone born from 1945 through 1965.  Anyone with known risk factors for hepatitis C. Sexually transmitted infections (STIs)  Get screened for STIs, including gonorrhea and chlamydia, if: ? You are sexually active and are younger than 62 years of age. ? You are older than 62 years of age and your health care provider tells you that you are at risk for this type of infection. ? Your sexual activity has changed since you were last screened, and you are at increased risk for chlamydia or gonorrhea. Ask your health care provider if   you are at risk.  Ask your health care provider about whether you are at high risk for HIV. Your health care provider may recommend a prescription medicine to help prevent HIV infection. If you choose to take medicine to prevent HIV, you should first get tested for HIV. You should then be tested every 3 months for as long as you are taking the medicine. Pregnancy  If you are about to stop having your period (premenopausal) and  you may become pregnant, seek counseling before you get pregnant.  Take 400 to 800 micrograms (mcg) of folic acid every day if you become pregnant.  Ask for birth control (contraception) if you want to prevent pregnancy. Osteoporosis and menopause Osteoporosis is a disease in which the bones lose minerals and strength with aging. This can result in bone fractures. If you are 65 years old or older, or if you are at risk for osteoporosis and fractures, ask your health care provider if you should:  Be screened for bone loss.  Take a calcium or vitamin D supplement to lower your risk of fractures.  Be given hormone replacement therapy (HRT) to treat symptoms of menopause. Follow these instructions at home: Lifestyle  Do not use any products that contain nicotine or tobacco, such as cigarettes, e-cigarettes, and chewing tobacco. If you need help quitting, ask your health care provider.  Do not use street drugs.  Do not share needles.  Ask your health care provider for help if you need support or information about quitting drugs. Alcohol use  Do not drink alcohol if: ? Your health care provider tells you not to drink. ? You are pregnant, may be pregnant, or are planning to become pregnant.  If you drink alcohol: ? Limit how much you use to 0-1 drink a day. ? Limit intake if you are breastfeeding.  Be aware of how much alcohol is in your drink. In the [place] one drink equals one 12 oz bottle of beer (355 mL), one 5 oz glass of wine (148 mL), or one 1 oz glass of hard liquor (44 mL). General instructions  Schedule regular health, dental, and eye exams.  Stay current with your vaccines.  Tell your health care provider if: ? You often feel depressed. ? You have ever been abused or do not feel safe at home. Summary  Adopting a healthy lifestyle and getting preventive care are important in promoting health and wellness.  Follow your health care provider's instructions about healthy  diet, exercising, and getting tested or screened for diseases.  Follow your health care provider's instructions on monitoring your cholesterol and blood pressure. This information is not intended to replace advice given to you by your health care provider. Make sure you discuss any questions you have with your health care provider. Document Revised: 04/11/2018 Document Reviewed: 04/11/2018 Elsevier Patient Education  2020 [employer].  

## 2019-06-07 NOTE — Progress Notes (Signed)
Subjective:    Patient ID: Alexandria Tucker, female    DOB: 14-Sep-1955, 63 y.o.   MRN: [id number]  Chief Complaint  Patient presents with  . Medical Management of Chronic Issues   PT presents to the office today for chronic follow up. PT complaining of right clavicle disformity  last year. States she has intermittent aching pain of right shoulder of 8 out 10. She is requesting x-ray today.  Hypertension This is a chronic problem. The current episode started more than 1 year ago. The problem has been resolved since onset. The problem is controlled. Pertinent negatives include no blurred vision, headaches, malaise/fatigue, peripheral edema or shortness of breath. Risk factors for coronary artery disease include dyslipidemia, obesity and sedentary lifestyle. The current treatment provides moderate improvement. There is no history of kidney disease, CAD/MI, CVA or heart failure.  Hyperlipidemia This is a chronic problem. The current episode started more than 1 year ago. The problem is uncontrolled. Exacerbating diseases include obesity. Pertinent negatives include no shortness of breath. Current antihyperlipidemic treatment includes statins. The current treatment provides moderate improvement of lipids. Risk factors for coronary artery disease include dyslipidemia, diabetes mellitus, hypertension, a sedentary lifestyle and post-menopausal.  Diabetes She presents for her follow-up diabetic visit. She has type 2 diabetes mellitus. Her disease course has been stable. There are no hypoglycemic associated symptoms. Pertinent negatives for hypoglycemia include no headaches. Pertinent negatives for diabetes include no blurred vision and no foot paresthesias. There are no hypoglycemic complications. Symptoms are stable. There are no diabetic complications. Pertinent negatives for diabetic complications include no CVA. Risk factors for coronary artery disease include dyslipidemia, diabetes mellitus, hypertension  and sedentary lifestyle. She is following a generally healthy diet. When asked about meal planning, she reported none. (Does not check blood sugars at home ) Eye exam is not current.      Review of Systems  Constitutional: Negative for malaise/fatigue.  Eyes: Negative for blurred vision.  Respiratory: Negative for shortness of breath.   Neurological: Negative for headaches.  All other systems reviewed and are negative.      Objective:   Physical Exam Vitals reviewed.  Constitutional:      General: She is not in acute distress.    Appearance: She is well-developed. She is obese.  HENT:     Head: Normocephalic and atraumatic.     Right Ear: Tympanic membrane normal.     Left Ear: Tympanic membrane normal.  Eyes:     Pupils: Pupils are equal, round, and reactive to light.  Neck:     Thyroid: No thyromegaly.  Cardiovascular:     Rate and Rhythm: Normal rate and regular rhythm.     Heart sounds: Normal heart sounds. No murmur.  Pulmonary:     Effort: Pulmonary effort is normal. No respiratory distress.     Breath sounds: Normal breath sounds. No wheezing.  Abdominal:     General: Bowel sounds are normal. There is no distension.     Palpations: Abdomen is soft.     Tenderness: There is no abdominal tenderness.  Musculoskeletal:        General: No tenderness. Normal range of motion.     Cervical back: Normal range of motion and neck supple.  Skin:    General: Skin is warm and dry.  Neurological:     Mental Status: She is alert and oriented to person, place, and time.     Cranial Nerves: No cranial nerve deficit.     Deep  Tendon Reflexes: Reflexes are normal and symmetric.  Psychiatric:        Behavior: Behavior normal.        Thought Content: Thought content normal.        Judgment: Judgment normal.      BP 125/75   Pulse 80   Temp 98.6 F (37 C) (Temporal)   Ht _0  (1.626 m)   Wt 206 lb 9.6 oz (93.7 kg)   SpO2 99%   BMI 35.46 kg/m      Assessment & Plan:   Alexandria Tucker comes in today with chief complaint of Medical Management of Chronic Issues   Diagnosis and orders addressed:  1. Essential hypertension - ramipril (ALTACE) 10 MG capsule; Take 1 capsule (10 mg total) by mouth daily.  Dispense: 90 capsule; Refill: 3 - hydrochlorothiazide (HYDRODIURIL) 25 MG tablet; Take 1 tablet (25 mg total) by mouth daily.  Dispense: 90 tablet; Refill: 2 - CMP14+EGFR - CBC with Differential/Platelet  2. Hypertension associated with diabetes ([place]) - hydrochlorothiazide (HYDRODIURIL) 25 MG tablet; Take 1 tablet (25 mg total) by mouth daily.  Dispense: 90 tablet; Refill: 2 - CMP14+EGFR - CBC with Differential/Platelet  3. Hyperlipidemia associated with type 2 diabetes mellitus (HCC) - simvastatin (ZOCOR) 20 MG tablet; Take 1 tablet (20 mg total) by mouth at bedtime.  Dispense: 90 tablet; Refill: 3 - CMP14+EGFR - CBC with Differential/Platelet - Lipid panel   5. Type 2 diabetes mellitus without complication, without long-term current use of insulin (HCC) - empagliflozin (JARDIANCE) 10 MG TABS tablet; Take 10 mg by mouth daily.  Dispense: 90 tablet; Refill: 3 - metFORMIN (GLUCOPHAGE) 1000 MG tablet; Take 1 tablet (1,000 mg total) by mouth 2 (two) times daily with a meal.  Dispense: 180 tablet; Refill: 1 - Bayer DCA Hb A1c Waived - CMP14+EGFR - CBC with Differential/Platelet - Microalbumin / creatinine urine ratio  6. Allergic rhinitis due to pollen, unspecified seasonality - CMP14+EGFR - CBC with Differential/Platelet  7. Morbid obesity ([place]) - CMP14+EGFR - CBC with Differential/Platelet  8. Acute pain of right shoulder - DG Clavicle Right; Future  9. Clavicle enlargement - DG Clavicle Right; Future   Labs pending Health Maintenance reviewed Diet and exercise encouraged  Follow up plan: 6 months    Evelina Dun, FNP

## 2019-06-08 LAB — MICROALBUMIN / CREATININE URINE RATIO
Creatinine, Urine: 49.3 mg/dL
Microalb/Creat Ratio: 11 mg/g{creat} (ref 0–29)
Microalbumin, Urine: 5.4 ug/mL

## 2019-06-11 ENCOUNTER — Other Ambulatory Visit: Payer: Self-pay | Admitting: Family

## 2019-06-11 NOTE — Telephone Encounter (Signed)
This is for Onychomycosis. But she only needed to take for 12 weeks. I believe she has completed this rx.

## 2019-06-11 NOTE — Telephone Encounter (Signed)
Spoke to pt and she says she isn't taking this medication. Will call pharmacy to cancel order and remove it from the med list.

## 2019-06-11 NOTE — Addendum Note (Signed)
Addended by: Milas Hock on: 06/11/2019 01:46 PM   Modules accepted: Orders

## 2019-07-05 ENCOUNTER — Ambulatory Visit: Payer: Self-pay | Attending: Internal Medicine

## 2019-07-05 DIAGNOSIS — Z23 Encounter for immunization: Secondary | ICD-10-CM | POA: Insufficient documentation

## 2019-07-05 NOTE — Progress Notes (Signed)
   Covid-19 Vaccination Clinic  Name:  Alexandria Tucker    MRN: [id number] DOB: 1955-05-13  07/05/2019  [name] was observed post Covid-19 immunization for 15 minutes without incident. She was provided with Vaccine Information Sheet and instruction to access the V-Safe system.   [name] was instructed to call 911 with any severe reactions post vaccine: Marland Kitchen Difficulty breathing  . Swelling of face and throat  . A fast heartbeat  . A bad rash all over body  . Dizziness and weakness   Immunizations Administered    Name Date Dose VIS Date Route   Moderna COVID-19 Vaccine 07/05/2019  8:39 AM 0.5 mL 04/02/2019 Intramuscular   Manufacturer: Moderna   [id number]   [id number]

## 2019-07-15 ENCOUNTER — Telehealth: Payer: Self-pay | Admitting: Family

## 2019-07-15 NOTE — Telephone Encounter (Signed)
Aware.  Use ice and take ibuprofen.

## 2019-08-06 ENCOUNTER — Ambulatory Visit: Payer: Self-pay | Attending: Internal Medicine

## 2019-08-06 DIAGNOSIS — Z23 Encounter for immunization: Secondary | ICD-10-CM

## 2019-08-06 NOTE — Progress Notes (Signed)
   Covid-19 Vaccination Clinic  Name:  Alexandria Tucker    MRN: [id number] DOB: Jul 19, 1955  08/06/2019  [name] was observed post Covid-19 immunization for 15 minutes without incident. She was provided with Vaccine Information Sheet and instruction to access the V-Safe system.   [name] was instructed to call 911 with any severe reactions post vaccine: Marland Kitchen Difficulty breathing  . Swelling of face and throat  . A fast heartbeat  . A bad rash all over body  . Dizziness and weakness   Immunizations Administered    Name Date Dose VIS Date Route   Moderna COVID-19 Vaccine 08/06/2019  8:42 AM 0.5 mL 04/02/2019 Intramuscular   Manufacturer: Moderna   [id number]   [id number]

## 2019-08-13 ENCOUNTER — Telehealth: Payer: Self-pay | Admitting: Family

## 2019-08-13 NOTE — Telephone Encounter (Signed)
Pt aware we do have samples and they are up front for her.

## 2019-08-13 NOTE — Telephone Encounter (Signed)
Can we check on samples for her?

## 2019-08-13 NOTE — Telephone Encounter (Signed)
Pt called stating that she was told by her pharmacy that her Vania Rea Rx was going to cost her over $1000 with her new insurance ([employer]) because it is a [employer]. Wants to know what her options are and if we have any samples she can have. Says she is in the process of applying for assistance.

## 2019-08-27 ENCOUNTER — Telehealth: Payer: Self-pay | Admitting: Family

## 2019-08-29 NOTE — Telephone Encounter (Signed)
LMOVM I have not seen forms and neither has the pharmacist. Which medication is the form for, I can probably pull from the website and start a new set

## 2019-08-30 NOTE — Telephone Encounter (Signed)
Paperwork was for [facility] [place], it has not been scanned into system yet. Pt will call back next week to see if we have samples and if form has been scanned

## 2019-09-05 ENCOUNTER — Telehealth: Payer: Self-pay | Admitting: Family

## 2019-09-05 DIAGNOSIS — E119 Type 2 diabetes mellitus without complications: Secondary | ICD-10-CM

## 2019-09-05 MED ORDER — JARDIANCE 10 MG PO TABS: 10.0000 mg | ORAL_TABLET | Freq: Every day | ORAL | 0 refills | Status: DC

## 2019-09-05 NOTE — Telephone Encounter (Signed)
Left detailed message samples up front  

## 2019-09-07 ENCOUNTER — Other Ambulatory Visit: Payer: Self-pay | Admitting: Family

## 2019-09-07 DIAGNOSIS — I10 Essential (primary) hypertension: Secondary | ICD-10-CM

## 2019-09-07 DIAGNOSIS — E1159 Type 2 diabetes mellitus with other circulatory complications: Secondary | ICD-10-CM

## 2019-09-07 DIAGNOSIS — I152 Hypertension secondary to endocrine disorders: Secondary | ICD-10-CM

## 2019-10-01 ENCOUNTER — Encounter: Payer: Self-pay | Admitting: Family Medicine

## 2019-10-01 ENCOUNTER — Ambulatory Visit (INDEPENDENT_AMBULATORY_CARE_PROVIDER_SITE_OTHER): Payer: 59 | Admitting: Family Medicine

## 2019-10-01 DIAGNOSIS — J0100 Acute maxillary sinusitis, unspecified: Secondary | ICD-10-CM

## 2019-10-01 MED ORDER — CEFPROZIL 500 MG PO TABS: 500.0000 mg | ORAL_TABLET | Freq: Two times a day (BID) | ORAL | 0 refills | Status: DC

## 2019-10-01 MED ORDER — PREDNISONE 10 MG PO TABS: ORAL_TABLET | ORAL | 0 refills | Status: DC

## 2019-10-01 NOTE — Progress Notes (Signed)
    Subjective:    Patient ID: Alexandria Tucker, female    DOB: 1956-02-25, 64 y.o.   MRN: [id number]   HPI: Alexandria Tucker is a 64 y.o. female presenting for Symptoms include congestion, facial pain, nasal congestion, non productive cough, post nasal drip and sinus pressure. There is no fever, chills, or sweats. Onset of symptoms was a few days ago, gradually worsening since that time.     Depression screen [facility] 2/9 06/07/2019 05/11/2018 02/08/2018 09/29/2017 09/27/2017  Decreased Interest 0 0 0 0 0  Down, Depressed, Hopeless 0 0 0 0 0  PHQ - 2 Score 0 0 0 0 0     Relevant past medical, surgical, family and social history reviewed and updated as indicated.  Interim medical history since our last visit reviewed. Allergies and medications reviewed and updated.  ROS:  Review of Systems  Constitutional: Negative for activity change, appetite change, chills and fever.  HENT: Positive for congestion, postnasal drip and sinus pressure.   Respiratory: Positive for cough and chest tightness. Negative for shortness of breath.   Skin: Negative for rash.     Social History   Tobacco Use  Smoking Status Never Smoker  Smokeless Tobacco Never Used       Objective:     Wt Readings from Last 3 Encounters:  06/07/19 206 lb 9.6 oz (93.7 kg)  05/11/18 212 lb (96.2 kg)  04/03/18 210 lb 6.4 oz (95.4 kg)     Exam deferred. Pt. Harboring due to COVID 19. Phone visit performed.   Assessment & Plan:  No diagnosis found.  Meds ordered this encounter  Medications  . cefPROZIL (CEFZIL) 500 MG tablet    Sig: Take 1 tablet (500 mg total) by mouth 2 (two) times daily. For infection. Take all of this medication.    Dispense:  20 tablet    Refill:  0  . predniSONE (DELTASONE) 10 MG tablet    Sig: Take 5 daily for 2 days followed by 4,3,2 and 1 for 2 days each.    Dispense:  30 tablet    Refill:  0    No orders of the defined types were placed in this encounter.     There are no  diagnoses linked to this encounter.  Virtual Visit via telephone Note  I discussed the limitations, risks, security and privacy concerns of performing an evaluation and management service by telephone and the availability of in person appointments. The patient was identified with two identifiers. Pt.expressed understanding and agreed to proceed. Pt. Is at home. Dr. Livia Snellen is in his office.  Follow Up Instructions:   I discussed the assessment and treatment plan with the patient. The patient was provided an opportunity to ask questions and all were answered. The patient agreed with the plan and demonstrated an understanding of the instructions.   The patient was advised to call back or seek an in-person evaluation if the symptoms worsen or if the condition fails to improve as anticipated.   Total minutes including chart review and phone contact time: 9   Follow up plan: Return if symptoms worsen or fail to improve.  Claretta Fraise, MD [place]

## 2019-10-02 ENCOUNTER — Ambulatory Visit: Payer: 59 | Admitting: Family Medicine

## 2019-12-05 ENCOUNTER — Other Ambulatory Visit: Payer: Self-pay | Admitting: Family

## 2019-12-05 DIAGNOSIS — I10 Essential (primary) hypertension: Secondary | ICD-10-CM

## 2019-12-05 DIAGNOSIS — I152 Hypertension secondary to endocrine disorders: Secondary | ICD-10-CM

## 2019-12-05 DIAGNOSIS — E1159 Type 2 diabetes mellitus with other circulatory complications: Secondary | ICD-10-CM

## 2019-12-06 ENCOUNTER — Other Ambulatory Visit: Payer: Self-pay

## 2019-12-06 ENCOUNTER — Ambulatory Visit (INDEPENDENT_AMBULATORY_CARE_PROVIDER_SITE_OTHER): Payer: 59 | Admitting: Family

## 2019-12-06 ENCOUNTER — Encounter: Payer: Self-pay | Admitting: Family

## 2019-12-06 VITALS — BP 114/77 | HR 79 | Temp 97.6°F | Ht 64.0 in | Wt 207.4 lb

## 2019-12-06 DIAGNOSIS — M159 Polyosteoarthritis, unspecified: Secondary | ICD-10-CM

## 2019-12-06 DIAGNOSIS — Z23 Encounter for immunization: Secondary | ICD-10-CM | POA: Diagnosis not present

## 2019-12-06 DIAGNOSIS — E785 Hyperlipidemia, unspecified: Secondary | ICD-10-CM

## 2019-12-06 DIAGNOSIS — E1169 Type 2 diabetes mellitus with other specified complication: Secondary | ICD-10-CM

## 2019-12-06 DIAGNOSIS — I10 Essential (primary) hypertension: Secondary | ICD-10-CM

## 2019-12-06 DIAGNOSIS — I152 Hypertension secondary to endocrine disorders: Secondary | ICD-10-CM

## 2019-12-06 DIAGNOSIS — E1159 Type 2 diabetes mellitus with other circulatory complications: Secondary | ICD-10-CM

## 2019-12-06 DIAGNOSIS — E119 Type 2 diabetes mellitus without complications: Secondary | ICD-10-CM

## 2019-12-06 DIAGNOSIS — J301 Allergic rhinitis due to pollen: Secondary | ICD-10-CM

## 2019-12-06 LAB — BAYER DCA HB A1C WAIVED: HB A1C (BAYER DCA - WAIVED): 6.8 % (ref <7.0)

## 2019-12-06 MED ORDER — MELOXICAM 15 MG PO TABS: 15.0000 mg | ORAL_TABLET | Freq: Every day | ORAL | 1 refills | Status: DC

## 2019-12-06 MED ORDER — SIMVASTATIN 20 MG PO TABS: 20.0000 mg | ORAL_TABLET | Freq: Every day | ORAL | 3 refills | Status: DC

## 2019-12-06 MED ORDER — MELOXICAM 15 MG PO TABS
15.0000 mg | ORAL_TABLET | Freq: Every day | ORAL | 3 refills | Status: DC
Start: 2019-12-06 — End: 2020-07-20

## 2019-12-06 MED ORDER — EMPAGLIFLOZIN 10 MG PO TABS: 10.0000 mg | ORAL_TABLET | Freq: Every day | ORAL | 2 refills | Status: DC

## 2019-12-06 MED ORDER — RAMIPRIL 10 MG PO CAPS: 10.0000 mg | ORAL_CAPSULE | Freq: Every day | ORAL | 3 refills | Status: DC

## 2019-12-06 MED ORDER — HYDROCHLOROTHIAZIDE 25 MG PO TABS: 25.0000 mg | ORAL_TABLET | Freq: Every day | ORAL | 3 refills | Status: DC

## 2019-12-06 MED ORDER — METFORMIN HCL 1000 MG PO TABS: 1000.0000 mg | ORAL_TABLET | Freq: Two times a day (BID) | ORAL | 1 refills | Status: DC

## 2019-12-06 NOTE — Progress Notes (Signed)
Subjective:    Patient ID: Alexandria Tucker, female    DOB: [birth year], 64 y.o.   MRN: [id number]  Chief Complaint  Patient presents with  . Medical Management of Chronic Issues    6 mth   . Hypertension  . Diabetes   PT presents to the office today for chronic follow up.  Hypertension This is a chronic problem. The current episode started more than 1 year ago. The problem has been resolved since onset. The problem is controlled. Pertinent negatives include no blurred vision, malaise/fatigue, peripheral edema or shortness of breath. Risk factors for coronary artery disease include dyslipidemia, diabetes mellitus, obesity and sedentary lifestyle. The current treatment provides moderate improvement. There is no history of CVA or heart failure.  Diabetes She presents for her follow-up diabetic visit. She has type 2 diabetes mellitus. Her disease course has been stable. There are no hypoglycemic associated symptoms. Pertinent negatives for diabetes include no blurred vision and no foot paresthesias. There are no hypoglycemic complications. Symptoms are stable. Pertinent negatives for diabetic complications include no CVA, nephropathy or peripheral neuropathy. Risk factors for coronary artery disease include dyslipidemia, hypertension, post-menopausal and sedentary lifestyle. She is following a generally unhealthy diet. (Does not check BS at home ) An ACE inhibitor/angiotensin II receptor blocker is being taken. Eye exam is not current.  Hyperlipidemia This is a chronic problem. The current episode started more than 1 year ago. The problem is controlled. Recent lipid tests were reviewed and are normal. Pertinent negatives include no shortness of breath. Current antihyperlipidemic treatment includes statins. The current treatment provides moderate improvement of lipids. Risk factors for coronary artery disease include post-menopausal and a sedentary lifestyle.  Arthritis Presents for initial visit.  The disease course has been stable. She complains of stiffness and joint swelling. Affected locations include the left knee, left hip and neck. Her pain is at a severity of 10/10.      Review of Systems  Constitutional: Negative for malaise/fatigue.  Eyes: Negative for blurred vision.  Respiratory: Negative for shortness of breath.   Musculoskeletal: Positive for arthritis, joint swelling and stiffness.  All other systems reviewed and are negative.      Objective:   Physical Exam Vitals reviewed.  Constitutional:      General: She is not in acute distress.    Appearance: She is well-developed.  HENT:     Head: Normocephalic and atraumatic.     Right Ear: Tympanic membrane normal.     Left Ear: Tympanic membrane normal.  Eyes:     Pupils: Pupils are equal, round, and reactive to light.  Neck:     Thyroid: No thyromegaly.  Cardiovascular:     Rate and Rhythm: Normal rate and regular rhythm.     Heart sounds: Normal heart sounds. No murmur heard.   Pulmonary:     Effort: Pulmonary effort is normal. No respiratory distress.     Breath sounds: Normal breath sounds. No wheezing.  Abdominal:     General: Bowel sounds are normal. There is no distension.     Palpations: Abdomen is soft.     Tenderness: There is no abdominal tenderness.  Musculoskeletal:        General: No tenderness. Normal range of motion.     Cervical back: Normal range of motion and neck supple.  Skin:    General: Skin is warm and dry.  Neurological:     Mental Status: She is alert and oriented to person, place, and time.  Cranial Nerves: No cranial nerve deficit.     Deep Tendon Reflexes: Reflexes are normal and symmetric.  Psychiatric:        Behavior: Behavior normal.        Thought Content: Thought content normal.        Judgment: Judgment normal.       BP 114/77   Pulse 79   Temp 97.6 F (36.4 C) (Temporal)   Ht _0  (1.626 m)   Wt 207 lb 6.4 oz (94.1 kg)   SpO2 99%   BMI 35.60 kg/m       Assessment & Plan:  Alexandria Tucker comes in today with chief complaint of Medical Management of Chronic Issues (6 mth ), Hypertension, and Diabetes   Diagnosis and orders addressed:  1. Type 2 diabetes mellitus without complication, without long-term current use of insulin (HCC) - Bayer DCA Hb A1c Waived - CMP14+EGFR - CBC with Differential/Platelet  2. Hypertension associated with diabetes ([place]) - CMP14+EGFR - CBC with Differential/Platelet  3. Hyperlipidemia associated with type 2 diabetes mellitus (HCC) - CMP14+EGFR - CBC with Differential/Platelet  4. Morbid obesity ([place]) - CMP14+EGFR - CBC with Differential/Platelet  5. Allergic rhinitis due to pollen, unspecified seasonality - CMP14+EGFR - CBC with Differential/Platelet  6. Osteoarthritis of multiple joints, unspecified osteoarthritis type Start Mobic daily, take with food No other NSAID's   Labs pending Health Maintenance reviewed Diet and exercise encouraged  Follow up plan: 6 months    Evelina Dun, FNP

## 2019-12-06 NOTE — Patient Instructions (Signed)

## 2019-12-07 LAB — CBC WITH DIFFERENTIAL/PLATELET
Basophils Absolute: 0.1 10*3/uL (ref 0.0–0.2)
Basos: 1 %
EOS (ABSOLUTE): 0.3 10*3/uL (ref 0.0–0.4)
Eos: 4 %
Hematocrit: 37.1 % (ref 34.0–46.6)
Hemoglobin: 12.1 g/dL (ref 11.1–15.9)
Immature Grans (Abs): 0 10*3/uL (ref 0.0–0.1)
Immature Granulocytes: 0 %
Lymphocytes Absolute: 1.9 10*3/uL (ref 0.7–3.1)
Lymphs: 21 %
MCH: 27.6 pg (ref 26.6–33.0)
MCHC: 32.6 g/dL (ref 31.5–35.7)
MCV: 85 fL (ref 79–97)
Monocytes Absolute: 0.7 10*3/uL (ref 0.1–0.9)
Monocytes: 7 %
Neutrophils Absolute: 5.9 10*3/uL (ref 1.4–7.0)
Neutrophils: 67 %
Platelets: 299 10*3/uL (ref 150–450)
RBC: 4.38 x10E6/uL (ref 3.77–5.28)
RDW: 15.1 % (ref 11.7–15.4)
WBC: 8.9 10*3/uL (ref 3.4–10.8)

## 2019-12-07 LAB — CMP14+EGFR
ALT: 38 IU/L — ABNORMAL HIGH (ref 0–32)
AST: 31 IU/L (ref 0–40)
Albumin/Globulin Ratio: 2.1 (ref 1.2–2.2)
Albumin: 4.7 g/dL (ref 3.8–4.8)
Alkaline Phosphatase: 72 IU/L (ref 48–121)
BUN/Creatinine Ratio: 26 (ref 12–28)
BUN: 18 mg/dL (ref 8–27)
Bilirubin Total: 0.5 mg/dL (ref 0.0–1.2)
CO2: 24 mmol/L (ref 20–29)
Calcium: 9.7 mg/dL (ref 8.7–10.3)
Chloride: 98 mmol/L (ref 96–106)
Creatinine, Ser: 0.7 mg/dL (ref 0.57–1.00)
GFR calc Af Amer: 107 mL/min/{1.73_m2} (ref >59)
GFR calc non Af Amer: 93 mL/min/{1.73_m2} (ref >59)
Globulin, Total: 2.2 g/dL (ref 1.5–4.5)
Glucose: 125 mg/dL — ABNORMAL HIGH (ref 65–99)
Potassium: 4 mmol/L (ref 3.5–5.2)
Sodium: 137 mmol/L (ref 134–144)
Total Protein: 6.9 g/dL (ref 6.0–8.5)

## 2020-01-24 LAB — HM DIABETES EYE EXAM

## 2020-03-05 ENCOUNTER — Ambulatory Visit (INDEPENDENT_AMBULATORY_CARE_PROVIDER_SITE_OTHER): Payer: 59

## 2020-03-05 ENCOUNTER — Other Ambulatory Visit: Payer: Self-pay

## 2020-03-05 DIAGNOSIS — Z23 Encounter for immunization: Secondary | ICD-10-CM

## 2020-04-22 ENCOUNTER — Ambulatory Visit (INDEPENDENT_AMBULATORY_CARE_PROVIDER_SITE_OTHER): Payer: 59

## 2020-04-22 ENCOUNTER — Other Ambulatory Visit: Payer: Self-pay

## 2020-04-22 DIAGNOSIS — Z23 Encounter for immunization: Secondary | ICD-10-CM

## 2020-06-07 ENCOUNTER — Other Ambulatory Visit: Payer: Self-pay | Admitting: Family

## 2020-06-07 DIAGNOSIS — I10 Essential (primary) hypertension: Secondary | ICD-10-CM

## 2020-06-19 ENCOUNTER — Ambulatory Visit: Payer: 59 | Admitting: Family

## 2020-06-28 ENCOUNTER — Other Ambulatory Visit: Payer: Self-pay | Admitting: Family

## 2020-06-28 DIAGNOSIS — E119 Type 2 diabetes mellitus without complications: Secondary | ICD-10-CM

## 2020-07-02 ENCOUNTER — Telehealth: Payer: Self-pay

## 2020-07-02 NOTE — Telephone Encounter (Signed)
Samples up front patient aware  

## 2020-07-17 ENCOUNTER — Ambulatory Visit (INDEPENDENT_AMBULATORY_CARE_PROVIDER_SITE_OTHER): Payer: 59 | Admitting: Family

## 2020-07-17 ENCOUNTER — Encounter: Payer: Self-pay | Admitting: Family

## 2020-07-17 ENCOUNTER — Other Ambulatory Visit: Payer: Self-pay

## 2020-07-17 VITALS — BP 112/71 | HR 78 | Temp 97.0°F | Ht 64.0 in | Wt 215.6 lb

## 2020-07-17 DIAGNOSIS — E1169 Type 2 diabetes mellitus with other specified complication: Secondary | ICD-10-CM

## 2020-07-17 DIAGNOSIS — J301 Allergic rhinitis due to pollen: Secondary | ICD-10-CM

## 2020-07-17 DIAGNOSIS — I152 Hypertension secondary to endocrine disorders: Secondary | ICD-10-CM

## 2020-07-17 DIAGNOSIS — E1159 Type 2 diabetes mellitus with other circulatory complications: Secondary | ICD-10-CM

## 2020-07-17 DIAGNOSIS — E785 Hyperlipidemia, unspecified: Secondary | ICD-10-CM

## 2020-07-17 LAB — BAYER DCA HB A1C WAIVED: HB A1C (BAYER DCA - WAIVED): 6.3 % (ref <7.0)

## 2020-07-17 MED ORDER — BLOOD GLUCOSE METER KIT: PACK | 0 refills | Status: DC

## 2020-07-17 MED ORDER — BLOOD GLUCOSE METER KIT: PACK | 0 refills | Status: AC

## 2020-07-17 NOTE — Patient Instructions (Signed)
Diabetes Mellitus and Nutrition, Adult When you have diabetes, or diabetes mellitus, it is very important to have healthy eating habits because your blood sugar (glucose) levels are greatly affected by what you eat and drink. Eating healthy foods in the right amounts, at about the same times every day, can help you:  Control your blood glucose.  Lower your risk of heart disease.  Improve your blood pressure.  Reach or maintain a healthy weight. What can affect my meal plan? Every person with diabetes is different, and each person has different needs for a meal plan. Your health care provider may recommend that you work with a dietitian to make a meal plan that is best for you. Your meal plan may vary depending on factors such as:  The calories you need.  The medicines you take.  Your weight.  Your blood glucose, blood pressure, and cholesterol levels.  Your activity level.  Other health conditions you have, such as heart or kidney disease. How do carbohydrates affect me? Carbohydrates, also called carbs, affect your blood glucose level more than any other type of food. Eating carbs naturally raises the amount of glucose in your blood. Carb counting is a method for keeping track of how many carbs you eat. Counting carbs is important to keep your blood glucose at a healthy level, especially if you use insulin or take certain oral diabetes medicines. It is important to know how many carbs you can safely have in each meal. This is different for every person. Your dietitian can help you calculate how many carbs you should have at each meal and for each snack. How does alcohol affect me? Alcohol can cause a sudden decrease in blood glucose (hypoglycemia), especially if you use insulin or take certain oral diabetes medicines. Hypoglycemia can be a life-threatening condition. Symptoms of hypoglycemia, such as sleepiness, dizziness, and confusion, are similar to symptoms of having too much  alcohol.  Do not drink alcohol if: ? Your health care provider tells you not to drink. ? You are pregnant, may be pregnant, or are planning to become pregnant.  If you drink alcohol: ? Do not drink on an empty stomach. ? Limit how much you use to:  0-1 drink a day for women.  0-2 drinks a day for men. ? Be aware of how much alcohol is in your drink. In the [place] one drink equals one 12 oz bottle of beer (355 mL), one 5 oz glass of wine (148 mL), or one 1 oz glass of hard liquor (44 mL). ? Keep yourself hydrated with water, diet soda, or unsweetened iced tea.  Keep in mind that regular soda, juice, and other mixers may contain a lot of sugar and must be counted as carbs. What are tips for following this plan? Reading food labels  Start by checking the serving size on the "Nutrition Facts" label of packaged foods and drinks. The amount of calories, carbs, fats, and other nutrients listed on the label is based on one serving of the item. Many items contain more than one serving per package.  Check the total grams (g) of carbs in one serving. You can calculate the number of servings of carbs in one serving by dividing the total carbs by 15. For example, if a food has 30 g of total carbs per serving, it would be equal to 2 servings of carbs.  Check the number of grams (g) of saturated fats and trans fats in one serving. Choose foods that have   a low amount or none of these fats.  Check the number of milligrams (mg) of salt (sodium) in one serving. Most people should limit total sodium intake to less than 2,300 mg per day.  Always check the nutrition information of foods labeled as "low-fat" or "nonfat." These foods may be higher in added sugar or refined carbs and should be avoided.  Talk to your dietitian to identify your daily goals for nutrients listed on the label. Shopping  Avoid buying canned, pre-made, or processed foods. These foods tend to be high in fat, sodium, and added  sugar.  Shop around the outside edge of the grocery store. This is where you will most often find fresh fruits and vegetables, bulk grains, fresh meats, and fresh dairy. Cooking  Use low-heat cooking methods, such as baking, instead of high-heat cooking methods like deep frying.  Cook using healthy oils, such as olive, canola, or sunflower oil.  Avoid cooking with butter, cream, or high-fat meats. Meal planning  Eat meals and snacks regularly, preferably at the same times every day. Avoid going long periods of time without eating.  Eat foods that are high in fiber, such as fresh fruits, vegetables, beans, and whole grains. Talk with your dietitian about how many servings of carbs you can eat at each meal.  Eat 4-6 oz (112-168 g) of lean protein each day, such as lean meat, chicken, fish, eggs, or tofu. One ounce (oz) of lean protein is equal to: ? 1 oz (28 g) of meat, chicken, or fish. ? 1 egg. ?  cup (62 g) of tofu.  Eat some foods each day that contain healthy fats, such as avocado, nuts, seeds, and fish.   What foods should I eat? Fruits Berries. Apples. Oranges. Peaches. Apricots. Plums. Grapes. Mango. Papaya. Pomegranate. Kiwi. Cherries. Vegetables Lettuce. Spinach. Leafy greens, including kale, chard, collard greens, and mustard greens. Beets. Cauliflower. Cabbage. Broccoli. Carrots. Green beans. Tomatoes. Peppers. Onions. Cucumbers. Brussels sprouts. Grains Whole grains, such as whole-wheat or whole-grain bread, crackers, tortillas, cereal, and pasta. Unsweetened oatmeal. Quinoa. Brown or wild rice. Meats and other proteins Seafood. Poultry without skin. Lean cuts of poultry and beef. Tofu. Nuts. Seeds. Dairy Low-fat or fat-free dairy products such as milk, yogurt, and cheese. The items listed above may not be a complete list of foods and beverages you can eat. Contact a dietitian for more information. What foods should I avoid? Fruits Fruits canned with  syrup. Vegetables Canned vegetables. Frozen vegetables with butter or cream sauce. Grains Refined white flour and flour products such as bread, pasta, snack foods, and cereals. Avoid all processed foods. Meats and other proteins Fatty cuts of meat. Poultry with skin. Breaded or fried meats. Processed meat. Avoid saturated fats. Dairy Full-fat yogurt, cheese, or milk. Beverages Sweetened drinks, such as soda or iced tea. The items listed above may not be a complete list of foods and beverages you should avoid. Contact a dietitian for more information. Questions to ask a health care provider  Do I need to meet with a diabetes educator?  Do I need to meet with a dietitian?  What number can I call if I have questions?  When are the best times to check my blood glucose? Where to find more information:  American Diabetes Association: diabetes.org  Academy of Nutrition and Dietetics: www.eatright.org  National Institute of Diabetes and Digestive and Kidney Diseases: www.niddk.nih.gov  Association of Diabetes Care and Education Specialists: www.diabeteseducator.org Summary  It is important to have healthy eating   habits because your blood sugar (glucose) levels are greatly affected by what you eat and drink.  A healthy meal plan will help you control your blood glucose and maintain a healthy lifestyle.  Your health care provider may recommend that you work with a dietitian to make a meal plan that is best for you.  Keep in mind that carbohydrates (carbs) and alcohol have immediate effects on your blood glucose levels. It is important to count carbs and to use alcohol carefully. This information is not intended to replace advice given to you by your health care provider. Make sure you discuss any questions you have with your health care provider. Document Revised: 03/26/2019 Document Reviewed: 03/26/2019 Elsevier Patient Education  2021 Elsevier Inc.  

## 2020-07-17 NOTE — Progress Notes (Addendum)
Subjective:    Patient ID: Alexandria Tucker, female    DOB: 06/24/1955, 65 y.o.   MRN: [id number]  Chief Complaint  Patient presents with  . Diabetes  . Hypertension   PT presents to the office today for chronic follow up.  Diabetes She presents for her follow-up diabetic visit. She has type 2 diabetes mellitus. There are no hypoglycemic associated symptoms. Associated symptoms include foot paresthesias. Pertinent negatives for diabetes include no blurred vision. There are no hypoglycemic complications. Symptoms are stable. Diabetic complications include peripheral neuropathy. Pertinent negatives for diabetic complications include no CVA or heart disease. Risk factors for coronary artery disease include diabetes mellitus, dyslipidemia, hypertension and sedentary lifestyle. She is following a generally healthy diet. (Does not check BS at home) Eye exam is current.  Hypertension This is a chronic problem. The current episode started more than 1 year ago. The problem has been resolved since onset. The problem is controlled. Pertinent negatives include no blurred vision, malaise/fatigue, peripheral edema or shortness of breath. Risk factors for coronary artery disease include dyslipidemia, diabetes mellitus, obesity and sedentary lifestyle. The current treatment provides moderate improvement. There is no history of CVA.  Hyperlipidemia This is a chronic problem. The current episode started more than 1 year ago. The problem is controlled. Recent lipid tests were reviewed and are normal. Pertinent negatives include no shortness of breath. Current antihyperlipidemic treatment includes statins. The current treatment provides moderate improvement of lipids. Risk factors for coronary artery disease include diabetes mellitus, hypertension, post-menopausal and dyslipidemia.      Review of Systems  Constitutional: Negative for malaise/fatigue.  Eyes: Negative for blurred vision.  Respiratory: Negative  for shortness of breath.   All other systems reviewed and are negative.      Objective:   Physical Exam Vitals reviewed.  Constitutional:      General: She is not in acute distress.    Appearance: She is well-developed. She is obese.  HENT:     Head: Normocephalic and atraumatic.     Right Ear: Tympanic membrane normal.     Left Ear: Tympanic membrane normal.  Eyes:     Pupils: Pupils are equal, round, and reactive to light.  Neck:     Thyroid: No thyromegaly.  Cardiovascular:     Rate and Rhythm: Normal rate and regular rhythm.     Heart sounds: Normal heart sounds. No murmur heard.   Pulmonary:     Effort: Pulmonary effort is normal. No respiratory distress.     Breath sounds: Normal breath sounds. No wheezing.  Abdominal:     General: Bowel sounds are normal. There is no distension.     Palpations: Abdomen is soft.     Tenderness: There is no abdominal tenderness.  Musculoskeletal:        General: No tenderness. Normal range of motion.     Cervical back: Normal range of motion and neck supple.  Skin:    General: Skin is warm and dry.  Neurological:     Mental Status: She is alert and oriented to person, place, and time.     Cranial Nerves: No cranial nerve deficit.     Deep Tendon Reflexes: Reflexes are normal and symmetric.  Psychiatric:        Behavior: Behavior normal.        Thought Content: Thought content normal.        Judgment: Judgment normal.     Diabetic Foot Exam - Simple   Simple Foot Form  Diabetic Foot exam was performed with the following findings: Yes 07/17/2020  8:31 AM  Visual Inspection No deformities, no ulcerations, no other skin breakdown bilaterally: Yes Sensation Testing Intact to touch and monofilament testing bilaterally: Yes Pulse Check Posterior Tibialis and Dorsalis pulse intact bilaterally: Yes Comments      BP 112/71   Pulse 78   Temp (!) 97 F (36.1 C) (Temporal)   Ht 5' 4" (1.626 m)   Wt 215 lb 9.6 oz (97.8 kg)    SpO2 99%   BMI 37.01 kg/m      Assessment & Plan:  Alexandria Tucker comes in today with chief complaint of Diabetes and Hypertension   Diagnosis and orders addressed:  1. Hypertension associated with diabetes ([place]) - CMP14+EGFR - CBC with Differential/Platelet  2. Allergic rhinitis due to pollen, unspecified seasonality - CMP14+EGFR - CBC with Differential/Platelet  3. Hyperlipidemia associated with type 2 diabetes mellitus ([place]) - CMP14+EGFR - CBC with Differential/Platelet - Lipid panel  4. Type 2 diabetes mellitus with other specified complication, without long-term current use of insulin (HCC) -Will hold Jardianc10 mg  Low carb  Glucose meter order Report increase glucose meter - CMP14+EGFR - CBC with Differential/Platelet - Bayer DCA Hb A1c Waived - Microalbumin / creatinine urine ratio  5. Morbid obesity ([place]) - CMP14+EGFR - CBC with Differential/Platelet   Labs pending Health Maintenance reviewed Diet and exercise encouraged  Follow up plan: 3 months    Evelina Dun, FNP

## 2020-07-18 LAB — CBC WITH DIFFERENTIAL/PLATELET
Basophils Absolute: 0.1 10*3/uL (ref 0.0–0.2)
Basos: 1 %
EOS (ABSOLUTE): 0.3 10*3/uL (ref 0.0–0.4)
Eos: 4 %
Hematocrit: 39.5 % (ref 34.0–46.6)
Hemoglobin: 13.1 g/dL (ref 11.1–15.9)
Immature Grans (Abs): 0 10*3/uL (ref 0.0–0.1)
Immature Granulocytes: 0 %
Lymphocytes Absolute: 2 10*3/uL (ref 0.7–3.1)
Lymphs: 25 %
MCH: 28.7 pg (ref 26.6–33.0)
MCHC: 33.2 g/dL (ref 31.5–35.7)
MCV: 86 fL (ref 79–97)
Monocytes Absolute: 0.6 10*3/uL (ref 0.1–0.9)
Monocytes: 8 %
Neutrophils Absolute: 4.8 10*3/uL (ref 1.4–7.0)
Neutrophils: 62 %
Platelets: 314 10*3/uL (ref 150–450)
RBC: 4.57 x10E6/uL (ref 3.77–5.28)
RDW: 14.5 % (ref 11.7–15.4)
WBC: 7.8 10*3/uL (ref 3.4–10.8)

## 2020-07-18 LAB — LIPID PANEL
Chol/HDL Ratio: 4.6 ratio — ABNORMAL HIGH (ref 0.0–4.4)
Cholesterol, Total: 157 mg/dL (ref 100–199)
HDL: 34 mg/dL — ABNORMAL LOW (ref >39)
LDL Chol Calc (NIH): 87 mg/dL (ref 0–99)
Triglycerides: 215 mg/dL — ABNORMAL HIGH (ref 0–149)
VLDL Cholesterol Cal: 36 mg/dL (ref 5–40)

## 2020-07-18 LAB — CMP14+EGFR
ALT: 52 IU/L — ABNORMAL HIGH (ref 0–32)
AST: 38 IU/L (ref 0–40)
Albumin/Globulin Ratio: 2 (ref 1.2–2.2)
Albumin: 4.7 g/dL (ref 3.8–4.8)
Alkaline Phosphatase: 71 IU/L (ref 44–121)
BUN/Creatinine Ratio: 22 (ref 12–28)
BUN: 17 mg/dL (ref 8–27)
Bilirubin Total: 0.6 mg/dL (ref 0.0–1.2)
CO2: 22 mmol/L (ref 20–29)
Calcium: 10.1 mg/dL (ref 8.7–10.3)
Chloride: 99 mmol/L (ref 96–106)
Creatinine, Ser: 0.78 mg/dL (ref 0.57–1.00)
Globulin, Total: 2.4 g/dL (ref 1.5–4.5)
Glucose: 129 mg/dL — ABNORMAL HIGH (ref 65–99)
Potassium: 4.6 mmol/L (ref 3.5–5.2)
Sodium: 139 mmol/L (ref 134–144)
Total Protein: 7.1 g/dL (ref 6.0–8.5)
eGFR: 85 mL/min/{1.73_m2} (ref >59)

## 2020-07-18 LAB — MICROALBUMIN / CREATININE URINE RATIO
Creatinine, Urine: 60.5 mg/dL
Microalb/Creat Ratio: 11 mg/g creat (ref 0–29)
Microalbumin, Urine: 6.5 ug/mL

## 2020-07-20 ENCOUNTER — Other Ambulatory Visit: Payer: Self-pay | Admitting: Family Medicine

## 2020-07-20 ENCOUNTER — Other Ambulatory Visit: Payer: Self-pay | Admitting: Family

## 2020-07-20 DIAGNOSIS — E1159 Type 2 diabetes mellitus with other circulatory complications: Secondary | ICD-10-CM

## 2020-07-20 DIAGNOSIS — I10 Essential (primary) hypertension: Secondary | ICD-10-CM

## 2020-07-20 DIAGNOSIS — E119 Type 2 diabetes mellitus without complications: Secondary | ICD-10-CM

## 2020-07-20 DIAGNOSIS — I152 Hypertension secondary to endocrine disorders: Secondary | ICD-10-CM

## 2020-07-20 MED ORDER — HYDROCHLOROTHIAZIDE 25 MG PO TABS: 25.0000 mg | ORAL_TABLET | Freq: Every day | ORAL | 3 refills | Status: DC

## 2020-07-20 MED ORDER — METFORMIN HCL 1000 MG PO TABS: 1000.0000 mg | ORAL_TABLET | Freq: Two times a day (BID) | ORAL | 1 refills | Status: DC

## 2020-07-20 MED ORDER — ROSUVASTATIN CALCIUM 20 MG PO TABS: 20.0000 mg | ORAL_TABLET | Freq: Every day | ORAL | 1 refills | Status: DC

## 2020-07-20 MED ORDER — RAMIPRIL 10 MG PO CAPS: 10.0000 mg | ORAL_CAPSULE | Freq: Every day | ORAL | 1 refills | Status: DC

## 2020-07-20 MED ORDER — MELOXICAM 15 MG PO TABS: 15.0000 mg | ORAL_TABLET | Freq: Every day | ORAL | 3 refills | Status: DC

## 2020-07-30 ENCOUNTER — Other Ambulatory Visit: Payer: Self-pay | Admitting: Obstetrics

## 2020-07-30 DIAGNOSIS — N6452 Nipple discharge: Secondary | ICD-10-CM

## 2020-08-31 ENCOUNTER — Encounter: Payer: Self-pay | Admitting: Family Medicine

## 2020-09-02 ENCOUNTER — Encounter: Payer: Self-pay | Admitting: Family Medicine

## 2020-09-08 ENCOUNTER — Encounter: Payer: Self-pay | Admitting: Family Medicine

## 2020-09-11 ENCOUNTER — Ambulatory Visit
Admission: RE | Admit: 2020-09-11 | Discharge: 2020-09-11 | Disposition: A | Discharge status: Home / Self Care | Payer: 59 | Source: Ambulatory Visit | Attending: Obstetrics | Admitting: Obstetrics

## 2020-09-11 ENCOUNTER — Other Ambulatory Visit: Payer: Self-pay | Admitting: Obstetrics

## 2020-09-11 ENCOUNTER — Other Ambulatory Visit: Payer: Self-pay

## 2020-09-11 DIAGNOSIS — N6452 Nipple discharge: Secondary | ICD-10-CM

## 2020-09-11 DIAGNOSIS — N6320 Unspecified lump in the left breast, unspecified quadrant: Secondary | ICD-10-CM

## 2020-09-18 ENCOUNTER — Ambulatory Visit
Admission: RE | Admit: 2020-09-18 | Discharge: 2020-09-18 | Disposition: A | Discharge status: Home / Self Care | Payer: 59 | Source: Ambulatory Visit | Attending: Obstetrics | Admitting: Obstetrics

## 2020-09-18 ENCOUNTER — Other Ambulatory Visit: Payer: Self-pay

## 2020-09-18 DIAGNOSIS — N6320 Unspecified lump in the left breast, unspecified quadrant: Secondary | ICD-10-CM

## 2020-09-23 ENCOUNTER — Encounter: Payer: Self-pay | Admitting: Family

## 2020-09-23 ENCOUNTER — Other Ambulatory Visit: Payer: Self-pay | Admitting: Obstetrics

## 2020-09-23 DIAGNOSIS — N6452 Nipple discharge: Secondary | ICD-10-CM

## 2020-09-25 ENCOUNTER — Telehealth: Payer: Self-pay | Admitting: *Deleted

## 2020-09-25 DIAGNOSIS — E119 Type 2 diabetes mellitus without complications: Secondary | ICD-10-CM

## 2020-09-25 MED ORDER — EMPAGLIFLOZIN 10 MG PO TABS: 10.0000 mg | ORAL_TABLET | Freq: Every day | ORAL | 0 refills | Status: DC

## 2020-09-25 NOTE — Telephone Encounter (Signed)
Samples placed up front for patient of Jardiance 10mg  and appointment scheduled with Almyra Free for June 14th at 1:30pm.

## 2020-10-10 ENCOUNTER — Ambulatory Visit
Admission: RE | Admit: 2020-10-10 | Discharge: 2020-10-10 | Disposition: A | Discharge status: Home / Self Care | Payer: 59 | Source: Ambulatory Visit | Attending: Obstetrics | Admitting: Obstetrics

## 2020-10-10 ENCOUNTER — Other Ambulatory Visit: Payer: Self-pay

## 2020-10-10 DIAGNOSIS — N6452 Nipple discharge: Secondary | ICD-10-CM

## 2020-10-10 MED ORDER — GADOBUTROL 1 MMOL/ML IV SOLN
10.0000 mL | Freq: Once | INTRAVENOUS | Status: AC | PRN
  Administered 2020-10-10: 10 mL via INTRAVENOUS

## 2020-10-12 ENCOUNTER — Telehealth: Payer: Self-pay | Admitting: Family

## 2020-10-12 NOTE — Telephone Encounter (Signed)
Pt calling to check on jardiance samples 10mg /25mg 

## 2020-10-12 NOTE — Telephone Encounter (Signed)
10 MG UP PT AWARE

## 2020-10-13 ENCOUNTER — Ambulatory Visit (INDEPENDENT_AMBULATORY_CARE_PROVIDER_SITE_OTHER): Payer: 59 | Admitting: Pharmacist

## 2020-10-13 ENCOUNTER — Other Ambulatory Visit: Payer: Self-pay

## 2020-10-13 DIAGNOSIS — E119 Type 2 diabetes mellitus without complications: Secondary | ICD-10-CM

## 2020-10-13 MED ORDER — STEGLATRO 15 MG PO TABS: 15.0000 mg | ORAL_TABLET | Freq: Every day | ORAL | 9 refills | Status: DC

## 2020-10-13 NOTE — Progress Notes (Signed)
    10/13/2020 Name: Alexandria Tucker MRN: [id number] DOB: [birth year]   S:  58 yoF Presents for diabetes evaluation, education, and management Patient was referred and last seen by Primary Care Provider on 09/25/20.  Insurance coverage/medication affordability: [employer]  Patient reports adherence with medications. Current diabetes medications include: jardiance, metformin Current hypertension medications include: hctz, altace Goal 130/80 Current hyperlipidemia medications include: rosuvastatin   Patient denies hypoglycemic events.   Patient reported dietary habits: Eats 3 meals/day  Patient-reported exercise habits: active with grandkids   O:  Lab Results  Component Value Date   HGBA1C 6.3 07/17/2020    Lipid Panel     Component Value Date/Time   CHOL 157 07/17/2020 0927   TRIG 215 (H) 07/17/2020 0927   TRIG 169 (H) 02/08/2013 0941   HDL 34 (L) 07/17/2020 0927   HDL 35 (L) 02/08/2013 0941   CHOLHDL 4.6 (H) 07/17/2020 0927   LDLCALC 87 07/17/2020 0927   LDLCALC 99 02/08/2013 0941     Home fasting blood sugars: 150-200  2 hour post-meal/random blood sugars: 200s.    Clinical Atherosclerotic Cardiovascular Disease (ASCVD): No   The 10-year ASCVD risk score Mikey Bussing DC Jr., et al., 2013) is: 9.7%   Values used to calculate the score:     Age: 35 years     Sex: Female     Is Non-Hispanic African American: No     Diabetic: Yes     Tobacco smoker: No     Systolic Blood Pressure: 272 mmHg     Is BP treated: Yes     HDL Cholesterol: 34 mg/dL     Total Cholesterol: 157 mg/dL    A/P:  Diabetes T2DM currently uncontrolled, however improving.  Patient is adherent with medication. Control is suboptimal due to financial.   Switch to Steglatro 15 mg tablets  Counseled Sick day rules: if sick, vomiting, have diarrhea, or  cannot drink enough fluids, you should stop taking SGLT-2 inhibitors until your symptoms go away. In rare cases, these medicines can cause  diabetic ketoacidosis (DKA). DKA is acid buildup in the blood.  Using copay card; bypass insurance  Continue metformin; GFR 85  Will repeat A1c in 3 months  -Extensively discussed pathophysiology of diabetes, recommended lifestyle interventions, dietary effects on blood sugar control  -Counseled on s/sx of and management of hypoglycemia  -Next A1C anticipated 3 months.  Written patient instructions provided.  Total time in face to face counseling 25 minutes.     Regina Eck, PharmD, BCPS Clinical Pharmacist, [place]  II Phone [phone]

## 2020-10-14 ENCOUNTER — Telehealth: Payer: Self-pay | Admitting: Family

## 2020-10-14 NOTE — Telephone Encounter (Signed)
Please let patient know:  [facility] in [place] does this for my patients  If they will not, have them bill insurance and we can try for a PA then she can use copay card.  I will have to follow up on this later this week.

## 2020-10-14 NOTE — Telephone Encounter (Signed)
Patient aware and verbalized understanding. °

## 2020-10-14 NOTE — Telephone Encounter (Signed)
Pt called to let Alexandria Tucker know that the issue has been resolved and that [facility] did file her insurance but also used coupon and the medicine was covered 100%.Marland Kitchen

## 2020-10-14 NOTE — Telephone Encounter (Signed)
Pt called to let Almyra Free know that when she went to the pharmacy yesterday to get her [facility] Rx with the coupon that Almyra Free gave her, the pharmacy told her that they had to bill pt's insurance and that they wouldn't just be able to use the coupon without billing the insurance.  Wants to speak with Almyra Free about this.  Please call patient at [phone]

## 2020-10-19 ENCOUNTER — Ambulatory Visit: Payer: 59 | Admitting: Family

## 2020-10-20 ENCOUNTER — Telehealth: Payer: Self-pay | Admitting: Family

## 2020-10-20 NOTE — Telephone Encounter (Addendum)
  F/u PA in 2-3 business days

## 2020-10-20 NOTE — Telephone Encounter (Signed)
Pt is having trouble getting the prescription that she discussed with Almyra Free and would like to see what she can do about getting it.

## 2020-10-23 ENCOUNTER — Ambulatory Visit: Payer: 59 | Admitting: Family

## 2020-10-23 NOTE — Telephone Encounter (Signed)
Denied on insurance (prefers [facility] optician) but patient can't afford these bc copay card won't cover  She has a high deductible plan so we used steglatro copay card and it processed for $0 at [employer] patient to inform

## 2020-11-18 ENCOUNTER — Telehealth: Payer: Self-pay | Admitting: Family Medicine

## 2020-11-19 ENCOUNTER — Other Ambulatory Visit: Payer: Self-pay | Admitting: Family

## 2020-11-19 MED ORDER — EMPAGLIFLOZIN 25 MG PO TABS: 25.0000 mg | ORAL_TABLET | Freq: Every day | ORAL | 1 refills | Status: DC

## 2020-11-19 NOTE — Progress Notes (Signed)
Patient states Alexandria Tucker can help get this covered can you help me with this

## 2020-11-19 NOTE — Progress Notes (Signed)
Per insurance [place] changed to [place].

## 2020-11-20 NOTE — Telephone Encounter (Signed)
Pt aware.

## 2020-11-20 NOTE — Telephone Encounter (Signed)
CALLED [facility] WENT THROUGH ON COPAY CARD ONLY (CANNOT RUN UNDER INSURANCE) PHARM TECH SAID IT WENT THROUGH THEY HAVE TO ORDER--HAVE PATIENT CALL [facility] TO SEE WHEN READY (LIKELY TOMORROW)  UNFORTUNATELY, PATIENT NEEDS TO CALL EVERY MONTH AND TELL [facility] TO RUN STEGLATRO UNDER COPAY CARD NOT INSURANCE. WORKS EVERY TIME  IF YOU CAN LET HER KNOW PLEASE!!  THANKS, Stryder Poitra

## 2020-11-29 ENCOUNTER — Other Ambulatory Visit: Payer: Self-pay | Admitting: Family

## 2020-11-29 DIAGNOSIS — E1159 Type 2 diabetes mellitus with other circulatory complications: Secondary | ICD-10-CM

## 2020-11-29 DIAGNOSIS — I10 Essential (primary) hypertension: Secondary | ICD-10-CM

## 2020-11-29 DIAGNOSIS — I152 Hypertension secondary to endocrine disorders: Secondary | ICD-10-CM

## 2021-01-15 ENCOUNTER — Other Ambulatory Visit: Payer: Self-pay

## 2021-01-15 ENCOUNTER — Encounter: Payer: Self-pay | Admitting: Family

## 2021-01-15 ENCOUNTER — Ambulatory Visit (INDEPENDENT_AMBULATORY_CARE_PROVIDER_SITE_OTHER): Payer: 59 | Admitting: Family

## 2021-01-15 VITALS — BP 114/64 | HR 79 | Temp 97.3°F | Resp 20 | Ht 64.0 in | Wt 211.0 lb

## 2021-01-15 DIAGNOSIS — E1159 Type 2 diabetes mellitus with other circulatory complications: Secondary | ICD-10-CM

## 2021-01-15 DIAGNOSIS — Z23 Encounter for immunization: Secondary | ICD-10-CM

## 2021-01-15 DIAGNOSIS — E785 Hyperlipidemia, unspecified: Secondary | ICD-10-CM

## 2021-01-15 DIAGNOSIS — I10 Essential (primary) hypertension: Secondary | ICD-10-CM

## 2021-01-15 DIAGNOSIS — E1169 Type 2 diabetes mellitus with other specified complication: Secondary | ICD-10-CM

## 2021-01-15 DIAGNOSIS — I152 Hypertension secondary to endocrine disorders: Secondary | ICD-10-CM

## 2021-01-15 LAB — CBC WITH DIFFERENTIAL/PLATELET
Basophils Absolute: 0.1 10*3/uL (ref 0.0–0.2)
Basos: 1 %
EOS (ABSOLUTE): 0.3 10*3/uL (ref 0.0–0.4)
Eos: 3 %
Hematocrit: 34.8 % (ref 34.0–46.6)
Hemoglobin: 10.7 g/dL — ABNORMAL LOW (ref 11.1–15.9)
Immature Grans (Abs): 0 10*3/uL (ref 0.0–0.1)
Immature Granulocytes: 0 %
Lymphocytes Absolute: 1.8 10*3/uL (ref 0.7–3.1)
Lymphs: 20 %
MCH: 25.7 pg — ABNORMAL LOW (ref 26.6–33.0)
MCHC: 30.7 g/dL — ABNORMAL LOW (ref 31.5–35.7)
MCV: 84 fL (ref 79–97)
Monocytes Absolute: 0.6 10*3/uL (ref 0.1–0.9)
Monocytes: 7 %
Neutrophils Absolute: 6.2 10*3/uL (ref 1.4–7.0)
Neutrophils: 69 %
Platelets: 290 10*3/uL (ref 150–450)
RBC: 4.16 x10E6/uL (ref 3.77–5.28)
RDW: 14.5 % (ref 11.7–15.4)
WBC: 9 10*3/uL (ref 3.4–10.8)

## 2021-01-15 LAB — CMP14+EGFR
ALT: 38 IU/L — ABNORMAL HIGH (ref 0–32)
AST: 31 IU/L (ref 0–40)
Albumin/Globulin Ratio: 2 (ref 1.2–2.2)
Albumin: 4.8 g/dL (ref 3.8–4.8)
Alkaline Phosphatase: 76 IU/L (ref 44–121)
BUN/Creatinine Ratio: 27 (ref 12–28)
BUN: 21 mg/dL (ref 8–27)
Bilirubin Total: 0.5 mg/dL (ref 0.0–1.2)
CO2: 23 mmol/L (ref 20–29)
Calcium: 9.5 mg/dL (ref 8.7–10.3)
Chloride: 98 mmol/L (ref 96–106)
Creatinine, Ser: 0.79 mg/dL (ref 0.57–1.00)
Globulin, Total: 2.4 g/dL (ref 1.5–4.5)
Glucose: 132 mg/dL — ABNORMAL HIGH (ref 65–99)
Potassium: 3.7 mmol/L (ref 3.5–5.2)
Sodium: 139 mmol/L (ref 134–144)
Total Protein: 7.2 g/dL (ref 6.0–8.5)
eGFR: 84 mL/min/{1.73_m2} (ref >59)

## 2021-01-15 LAB — BAYER DCA HB A1C WAIVED: HB A1C (BAYER DCA - WAIVED): 6.7 % — ABNORMAL HIGH (ref 4.8–5.6)

## 2021-01-15 MED ORDER — ROSUVASTATIN CALCIUM 20 MG PO TABS
20.0000 mg | ORAL_TABLET | Freq: Every day | ORAL | 1 refills | Status: DC
Start: 2021-01-15 — End: 2021-07-19

## 2021-01-15 MED ORDER — RAMIPRIL 10 MG PO CAPS: 10.0000 mg | ORAL_CAPSULE | Freq: Every day | ORAL | 1 refills | Status: DC

## 2021-01-15 NOTE — Progress Notes (Signed)
Subjective:    Patient ID: Alexandria Tucker, female    DOB: 21-Jun-1955, 65 y.o.   MRN: [id number]  Chief Complaint  Patient presents with   Medical Management of Chronic Issues   PT presents to the office today for chronic follow up.  Hypertension This is a chronic problem. The current episode started more than 1 year ago. The problem has been resolved since onset. The problem is controlled. Pertinent negatives include no blurred vision, malaise/fatigue, peripheral edema or shortness of breath. Risk factors for coronary artery disease include dyslipidemia, diabetes mellitus and obesity. The current treatment provides moderate improvement. There is no history of heart failure.  Hyperlipidemia This is a chronic problem. The current episode started more than 1 year ago. The problem is controlled. Exacerbating diseases include obesity. Pertinent negatives include no shortness of breath. Current antihyperlipidemic treatment includes statins. The current treatment provides moderate improvement of lipids. Risk factors for coronary artery disease include dyslipidemia, diabetes mellitus, hypertension and a sedentary lifestyle.  Diabetes She presents for her follow-up diabetic visit. She has type 2 diabetes mellitus. Pertinent negatives for diabetes include no blurred vision and no foot paresthesias. Symptoms are stable. Risk factors for coronary artery disease include diabetes mellitus, dyslipidemia, hypertension, sedentary lifestyle and post-menopausal. She is following a generally healthy diet. Her overall blood glucose range is 140-180 mg/dl. An ACE inhibitor/angiotensin II receptor blocker is being taken.     Review of Systems  Constitutional:  Negative for malaise/fatigue.  Eyes:  Negative for blurred vision.  Respiratory:  Negative for shortness of breath.   All other systems reviewed and are negative.     Objective:   Physical Exam Vitals reviewed.  Constitutional:      General: She is  not in acute distress.    Appearance: She is well-developed. She is obese.  HENT:     Head: Normocephalic and atraumatic.     Right Ear: Tympanic membrane normal.     Left Ear: Tympanic membrane normal.  Eyes:     Pupils: Pupils are equal, round, and reactive to light.  Neck:     Thyroid: No thyromegaly.  Cardiovascular:     Rate and Rhythm: Normal rate and regular rhythm.     Heart sounds: Normal heart sounds. No murmur heard. Pulmonary:     Effort: Pulmonary effort is normal. No respiratory distress.     Breath sounds: Normal breath sounds. No wheezing.  Abdominal:     General: Bowel sounds are normal. There is no distension.     Palpations: Abdomen is soft.     Tenderness: There is no abdominal tenderness.  Musculoskeletal:        General: No tenderness. Normal range of motion.     Cervical back: Normal range of motion and neck supple.  Skin:    General: Skin is warm and dry.  Neurological:     Mental Status: She is alert and oriented to person, place, and time.     Cranial Nerves: No cranial nerve deficit.     Deep Tendon Reflexes: Reflexes are normal and symmetric.  Psychiatric:        Behavior: Behavior normal.        Thought Content: Thought content normal.        Judgment: Judgment normal.         BP 114/64   Pulse 79   Temp (!) 97.3 F (36.3 C) (Temporal)   Resp 20   Ht '5\' 4"'  (1.626 m)   Wt  211 lb (95.7 kg)   SpO2 100%   BMI 36.22 kg/m   Assessment & Plan:  Alexandria Tucker comes in today with chief complaint of Medical Management of Chronic Issues   Diagnosis and orders addressed:  1. Essential hypertension - ramipril (ALTACE) 10 MG capsule; Take 1 capsule (10 mg total) by mouth daily.  Dispense: 90 capsule; Refill: 1 - CMP14+EGFR - CBC with Differential/Platelet  2. Need for immunization against influenza - Flu Vaccine QUAD 41moIM (Fluarix, Fluzone & Alfiuria Quad PF) - CMP14+EGFR - CBC with Differential/Platelet  3. Hypertension associated  with diabetes ([place] - CMP14+EGFR - CBC with Differential/Platelet  4. Hyperlipidemia associated with type 2 diabetes mellitus (HCC) - rosuvastatin (CRESTOR) 20 MG tablet; Take 1 tablet (20 mg total) by mouth daily.  Dispense: 90 tablet; Refill: 1 - CMP14+EGFR - CBC with Differential/Platelet  5. Type 2 diabetes mellitus with other specified complication, without long-term current use of insulin (HCC) - Bayer DCA Hb A1c Waived - CMP14+EGFR - CBC with Differential/Platelet  6. Morbid obesity ([place] - CMP14+EGFR - CBC with Differential/Platelet   Labs pending Health Maintenance reviewed Diet and exercise encouraged  Follow up plan: 6 months    CEvelina Dun FNP

## 2021-01-15 NOTE — Patient Instructions (Signed)
Health Maintenance, Female Adopting a healthy lifestyle and getting preventive care are important in promoting health and wellness. Ask your health care provider about: The right schedule for you to have regular tests and exams. Things you can do on your own to prevent diseases and keep yourself healthy. What should I know about diet, weight, and exercise? Eat a healthy diet  Eat a diet that includes plenty of vegetables, fruits, low-fat dairy products, and lean protein. Do not eat a lot of foods that are high in solid fats, added sugars, or sodium. Maintain a healthy weight Body mass index (BMI) is used to identify weight problems. It estimates body fat based on height and weight. Your health care provider can help determine your BMI and help you achieve or maintain a healthy weight. Get regular exercise Get regular exercise. This is one of the most important things you can do for your health. Most adults should: Exercise for at least 150 minutes each week. The exercise should increase your heart rate and make you sweat (moderate-intensity exercise). Do strengthening exercises at least twice a week. This is in addition to the moderate-intensity exercise. Spend less time sitting. Even light physical activity can be beneficial. Watch cholesterol and blood lipids Have your blood tested for lipids and cholesterol at 63 years of age, then have this test every 5 years. Have your cholesterol levels checked more often if: Your lipid or cholesterol levels are high. You are older than 63 years of age. You are at high risk for heart disease. What should I know about cancer screening? Depending on your health history and family history, you may need to have cancer screening at various ages. This may include screening for: Breast cancer. Cervical cancer. Colorectal cancer. Skin cancer. Lung cancer. What should I know about heart disease, diabetes, and high blood pressure? Blood pressure and heart  disease High blood pressure causes heart disease and increases the risk of stroke. This is more likely to develop in people who have high blood pressure readings, are of African descent, or are overweight. Have your blood pressure checked: Every 3-5 years if you are 18-39 years of age. Every year if you are 40 years old or older. Diabetes Have regular diabetes screenings. This checks your fasting blood sugar level. Have the screening done: Once every three years after age 40 if you are at a normal weight and have a low risk for diabetes. More often and at a younger age if you are overweight or have a high risk for diabetes. What should I know about preventing infection? Hepatitis B If you have a higher risk for hepatitis B, you should be screened for this virus. Talk with your health care provider to find out if you are at risk for hepatitis B infection. Hepatitis C Testing is recommended for: Everyone born from 1945 through 1965. Anyone with known risk factors for hepatitis C. Sexually transmitted infections (STIs) Get screened for STIs, including gonorrhea and chlamydia, if: You are sexually active and are younger than 63 years of age. You are older than 63 years of age and your health care provider tells you that you are at risk for this type of infection. Your sexual activity has changed since you were last screened, and you are at increased risk for chlamydia or gonorrhea. Ask your health care provider if you are at risk. Ask your health care provider about whether you are at high risk for HIV. Your health care provider may recommend a prescription medicine   to help prevent HIV infection. If you choose to take medicine to prevent HIV, you should first get tested for HIV. You should then be tested every 3 months for as long as you are taking the medicine. Pregnancy If you are about to stop having your period (premenopausal) and you may become pregnant, seek counseling before you get  pregnant. Take 400 to 800 micrograms (mcg) of folic acid every day if you become pregnant. Ask for birth control (contraception) if you want to prevent pregnancy. Osteoporosis and menopause Osteoporosis is a disease in which the bones lose minerals and strength with aging. This can result in bone fractures. If you are 65 years old or older, or if you are at risk for osteoporosis and fractures, ask your health care provider if you should: Be screened for bone loss. Take a calcium or vitamin D supplement to lower your risk of fractures. Be given hormone replacement therapy (HRT) to treat symptoms of menopause. Follow these instructions at home: Lifestyle Do not use any products that contain nicotine or tobacco, such as cigarettes, e-cigarettes, and chewing tobacco. If you need help quitting, ask your health care provider. Do not use street drugs. Do not share needles. Ask your health care provider for help if you need support or information about quitting drugs. Alcohol use Do not drink alcohol if: Your health care provider tells you not to drink. You are pregnant, may be pregnant, or are planning to become pregnant. If you drink alcohol: Limit how much you use to 0-1 drink a day. Limit intake if you are breastfeeding. Be aware of how much alcohol is in your drink. In the [place] one drink equals one 12 oz bottle of beer (355 mL), one 5 oz glass of wine (148 mL), or one 1 oz glass of hard liquor (44 mL). General instructions Schedule regular health, dental, and eye exams. Stay current with your vaccines. Tell your health care provider if: You often feel depressed. You have ever been abused or do not feel safe at home. Summary Adopting a healthy lifestyle and getting preventive care are important in promoting health and wellness. Follow your health care provider's instructions about healthy diet, exercising, and getting tested or screened for diseases. Follow your health care provider's  instructions on monitoring your cholesterol and blood pressure. This information is not intended to replace advice given to you by your health care provider. Make sure you discuss any questions you have with your health care provider. Document Revised: 06/26/2020 Document Reviewed: 04/11/2018 Elsevier Patient Education  2022 Elsevier Inc.  

## 2021-01-20 LAB — IRON: Iron: 38 ug/dL (ref 27–139)

## 2021-01-20 LAB — SPECIMEN STATUS REPORT

## 2021-01-20 LAB — VITAMIN B12: Vitamin B-12: 462 pg/mL (ref 232–1245)

## 2021-01-25 LAB — HM DIABETES EYE EXAM

## 2021-02-02 ENCOUNTER — Telehealth: Payer: Self-pay | Admitting: Family

## 2021-02-02 NOTE — Telephone Encounter (Signed)
Patient aware and verbalized understanding. °

## 2021-02-02 NOTE — Telephone Encounter (Signed)
Wants to talk to nurse about iron. Please call back and advise.

## 2021-03-07 ENCOUNTER — Other Ambulatory Visit: Payer: Self-pay | Admitting: Family

## 2021-03-07 DIAGNOSIS — I152 Hypertension secondary to endocrine disorders: Secondary | ICD-10-CM

## 2021-03-07 DIAGNOSIS — I10 Essential (primary) hypertension: Secondary | ICD-10-CM

## 2021-03-16 ENCOUNTER — Encounter: Payer: Self-pay | Admitting: Family Medicine

## 2021-03-16 ENCOUNTER — Ambulatory Visit: Payer: 59 | Admitting: Family Medicine

## 2021-03-16 DIAGNOSIS — U071 COVID-19: Secondary | ICD-10-CM | POA: Diagnosis not present

## 2021-03-16 MED ORDER — MOLNUPIRAVIR EUA 200MG CAPSULE: 4.0000 | ORAL_CAPSULE | Freq: Two times a day (BID) | ORAL | 0 refills | Status: AC

## 2021-03-16 NOTE — Progress Notes (Signed)
Virtual Visit via telephone Note Due to COVID-19 pandemic this visit was conducted virtually. This visit type was conducted due to national recommendations for restrictions regarding the COVID-19 Pandemic (e.g. social distancing, sheltering in place) in an effort to limit this patient's exposure and mitigate transmission in our community. All issues noted in this document were discussed and addressed.  A physical exam was not performed with this format.   I connected with [name] on 03/16/2021 at 1220 by telephone and verified that I am speaking with the correct person using two identifiers. Alexandria Tucker is currently located at home and family is currently with them during visit. The provider, Monia Pouch, FNP is located in their office at time of visit.  I discussed the limitations, risks, security and privacy concerns of performing an evaluation and management service by telephone and the availability of in person appointments. I also discussed with the patient that there may be a patient responsible charge related to this service. The patient expressed understanding and agreed to proceed.  Subjective:  Patient ID: Alexandria Tucker, female    DOB: December 15, 1955, 65 y.o.   MRN: [id number]  Chief Complaint:  Covid Positive   HPI: Alexandria Tucker is a 65 y.o. female presenting on 03/16/2021 for Covid Positive   Pt reports she started having general myalgias on Sunday, woke up with cough and congestion yesterday. Tested for COVID at home and it was positive. States her husband has COVID as well. She has been taking Robitussin and Tylenol as needed for symptomatic control.     Relevant past medical, surgical, family, and social history reviewed and updated as indicated.  Allergies and medications reviewed and updated.   Past Medical History:  Diagnosis Date   Breast mass, right    Diabetes mellitus without complication ([place])    History of kidney stones    Hyperlipidemia     Hypertension     Past Surgical History:  Procedure Laterality Date   ABDOMINAL HYSTERECTOMY     BREAST EXCISIONAL BIOPSY Right 2017   Excision for Rt. Infected Duct   BREAST LUMPECTOMY WITH RADIOACTIVE SEED LOCALIZATION Right 04/01/2016   Procedure: RIGHT BREAST LUMPECTOMY WITH RADIOACTIVE SEED LOCALIZATION;  Surgeon: Excell Seltzer, MD;  Location: [facility];  Service: General;  Laterality: Right;   CESAREAN SECTION     CYSTOSCOPY/RETROGRADE/URETEROSCOPY Right 11/23/2016   Procedure: CYSTOSCOPY/RIGHT RETROGRADE/URETEROSCOPY LASER LITHOTRIPSY AND STONE REMOVAL ;  Surgeon: Raynelle Bring, MD;  Location: [facility];  Service: Urology;  Laterality: Right;   Kidney stones     TUBAL LIGATION      Social History   Socioeconomic History   Marital status: Married    Spouse name: Not on file   Number of children: Not on file   Years of education: Not on file   Highest education level: Not on file  Occupational History   Not on file  Tobacco Use   Smoking status: Never   Smokeless tobacco: Never  Vaping Use   Vaping Use: Never used  Substance and Sexual Activity   Alcohol use: Yes    Alcohol/week: 0.0 standard drinks    Comment: social   Drug use: No   Sexual activity: Not on file  Other Topics Concern   Not on file  Social History Narrative   Not on file   Social Determinants of Health   Financial Resource Strain: Not on file  Food Insecurity: Not on file  Transportation Needs: Not on file  Physical Activity: Not on file  Stress: Not on file  Social Connections: Not on file  Intimate Partner Violence: Not on file    Outpatient Encounter Medications as of 03/16/2021  Medication Sig   molnupiravir EUA (LAGEVRIO) 200 mg CAPS capsule Take 4 capsules (800 mg total) by mouth 2 (two) times daily for 5 days.   aspirin EC 81 MG tablet Take 81 mg by mouth daily.   blood glucose meter kit and supplies Dispense based on patient and insurance preference. Use up to four  times daily as directed. (FOR ICD-10 E10.9, E11.9).   ertugliflozin L-PyroglutamicAc (STEGLATRO) 15 MG TABS tablet Take 15 mg by mouth daily.   fexofenadine (ALLEGRA) 180 MG tablet Take 1 tablet (180 mg total) by mouth daily.   fluticasone (FLONASE) 50 MCG/ACT nasal spray One to 2 sprays each nostril at bedtime   hydrochlorothiazide (HYDRODIURIL) 25 MG tablet Take 1 tablet by mouth once daily   meloxicam (MOBIC) 15 MG tablet Take 1 tablet (15 mg total) by mouth daily.   metFORMIN (GLUCOPHAGE) 1000 MG tablet Take 1 tablet (1,000 mg total) by mouth 2 (two) times daily with a meal.   ramipril (ALTACE) 10 MG capsule Take 1 capsule (10 mg total) by mouth daily.   rosuvastatin (CRESTOR) 20 MG tablet Take 1 tablet (20 mg total) by mouth daily.   No facility-administered encounter medications on file as of 03/16/2021.    Allergies  Allergen Reactions   Other Itching and Other (See Comments)   Atorvastatin Other (See Comments)   Augmentin [Amoxicillin-Pot Clavulanate] Other (See Comments)    Thrush   Bactrim [Sulfamethoxazole-Trimethoprim]     rash with itching   Lipitor [Atorvastatin Calcium] Other (See Comments)    Leg pain   Niaspan [Niacin Er] Other (See Comments)    Burning, itching skin.    Review of Systems  Constitutional:  Positive for activity change, appetite change, chills, fatigue and fever. Negative for diaphoresis and unexpected weight change.  HENT:  Positive for congestion, postnasal drip and rhinorrhea.   Respiratory:  Positive for cough. Negative for shortness of breath.   Cardiovascular:  Negative for chest pain, palpitations and leg swelling.  Gastrointestinal:  Negative for abdominal pain.  Genitourinary:  Negative for decreased urine volume and difficulty urinating.  Musculoskeletal:  Positive for myalgias.  Neurological:  Positive for headaches. Negative for dizziness, tremors, seizures, syncope, facial asymmetry, speech difficulty, weakness, light-headedness and  numbness.  Psychiatric/Behavioral:  Negative for confusion.   All other systems reviewed and are negative.       Observations/Objective: No vital signs or physical exam, this was a telephone or virtual health encounter.  Pt alert and oriented, answers all questions appropriately, and able to speak in full sentences.    Assessment and Plan: Alexandria Tucker was seen today for covid positive.  Diagnoses and all orders for this visit:  [id number] with multiple comorbidities Tested positive for COVID-19 yesterday with onset of symptoms Sunday. Pt has obesity, T2DM, HTN, and hyperlipidemia. Will start on antiviral therapy. Symptomatic care discussed in detail. Pt aware to report any new, worsening, or persistent symptoms.  -     molnupiravir EUA (LAGEVRIO) 200 mg CAPS capsule; Take 4 capsules (800 mg total) by mouth 2 (two) times daily for 5 days.     Follow Up Instructions: Return if symptoms worsen or fail to improve.    I discussed the assessment and treatment plan with the patient. The patient was provided an opportunity to ask questions and all  were answered. The patient agreed with the plan and demonstrated an understanding of the instructions.   The patient was advised to call back or seek an in-person evaluation if the symptoms worsen or if the condition fails to improve as anticipated.  The above assessment and management plan was discussed with the patient. The patient verbalized understanding of and has agreed to the management plan. Patient is aware to call the clinic if they develop any new symptoms or if symptoms persist or worsen. Patient is aware when to return to the clinic for a follow-up visit. Patient educated on when it is appropriate to go to the emergency department.    I provided 12 minutes of non-face-to-face time during this encounter. The call started at 1220. The call ended at 1230. The other time was used for coordination of care.    Monia Pouch, FNP-C [facility] [address] [place], [place] [address] [phone] 03/16/2021

## 2021-03-23 ENCOUNTER — Ambulatory Visit: Payer: 59 | Admitting: Pharmacist

## 2021-04-06 ENCOUNTER — Ambulatory Visit (INDEPENDENT_AMBULATORY_CARE_PROVIDER_SITE_OTHER): Payer: 59 | Admitting: Pharmacist

## 2021-04-06 DIAGNOSIS — E1169 Type 2 diabetes mellitus with other specified complication: Secondary | ICD-10-CM | POA: Diagnosis not present

## 2021-04-06 NOTE — Progress Notes (Signed)
    04/06/2021 Name: Alexandria Tucker MRN: [id number] DOB: 08-Nov-1955  S:  40 yoF Presents for diabetes evaluation, education, and management Patient was referred and last seen by Primary Care Provider on 03/16/21.   Insurance coverage/medication affordability: [employer]   Patient reports adherence with medications. Current diabetes medications include: STEGLATRO metformin Current hypertension medications include: hctz, altace Goal 130/80 Current hyperlipidemia medications include: rosuvastatin   Patient denies hypoglycemic events.   Patient reported dietary habits: Eats 3 meals/day   Patient-reported exercise habits: active with grandkids   O:  Lab Results  Component Value Date   HGBA1C 6.7 (H) 01/15/2021   Lipid Panel     Component Value Date/Time   CHOL 157 07/17/2020 0927   TRIG 215 (H) 07/17/2020 0927   TRIG 169 (H) 02/08/2013 0941   HDL 34 (L) 07/17/2020 0927   HDL 35 (L) 02/08/2013 0941   CHOLHDL 4.6 (H) 07/17/2020 0927   LDLCALC 87 07/17/2020 0927   LDLCALC 99 02/08/2013 0941    Home fasting blood sugars: <130  2 hour post-meal/random blood sugars: <180.    Clinical Atherosclerotic Cardiovascular Disease (ASCVD): No   The 10-year ASCVD risk score (Arnett DK, et al., 2019) is: 10%   Values used to calculate the score:     Age: 65 years     Sex: Female     Is Non-Hispanic African American: No     Diabetic: Yes     Tobacco smoker: No     Systolic Blood Pressure: 673 mmHg     Is BP treated: Yes     HDL Cholesterol: 34 mg/dL     Total Cholesterol: 157 mg/dL    A/P:  Diabetes T2DM currently controlled.  Patient is adherent with medication.   -Continue metformin  -STOP steglatro  -START Mounjaro 2.5mg  sq weekly  Denies personal and family history of Medullary thyroid cancer (MTC)  Sample pack given   Patient to check in with me in 3 weeks to note progress  -Extensively discussed pathophysiology of diabetes, recommended lifestyle  interventions, dietary effects on blood sugar control  -Counseled on s/sx of and management of hypoglycemia  Written patient instructions provided.  Total time in face to face counseling 25 minutes.   Regina Eck, PharmD, BCPS Clinical Pharmacist, [facility]  [facility] Phone [phone]

## 2021-04-07 NOTE — Addendum Note (Signed)
Addended by: Lottie Dawson D on: 04/07/2021 02:13 PM   Modules accepted: Orders

## 2021-04-14 ENCOUNTER — Other Ambulatory Visit: Payer: Self-pay | Admitting: Family

## 2021-04-14 DIAGNOSIS — E119 Type 2 diabetes mellitus without complications: Secondary | ICD-10-CM

## 2021-04-22 ENCOUNTER — Telehealth: Payer: Self-pay | Admitting: Family

## 2021-04-22 DIAGNOSIS — E1169 Type 2 diabetes mellitus with other specified complication: Secondary | ICD-10-CM

## 2021-04-22 MED ORDER — TIRZEPATIDE 5 MG/0.5ML ~~LOC~~ SOAJ: 5.0000 mg | SUBCUTANEOUS | 3 refills | Status: DC

## 2021-04-22 NOTE — Telephone Encounter (Signed)
Patient aware and verbalized understanding. °

## 2021-04-22 NOTE — Telephone Encounter (Signed)
WILL INCREASE MOUNJARO TO 5MG  SQ WEEKLY RX SENT TO [facility] THERE HAVE BEEN BACK ORDER PROBLEMS PATIENT MAY HAVE TO CALL TO SEE IF AVAILABLE (KEEP CHECKING AT [facility]) OR CALL AROUND TO OTHER PHARMACIES AND LET us KNOW IF SHE CAN FIND  WE DON'T HAVE THE TIME TO CALL AROUND BUT PATIENT CAN FOLLOW UP AND LET us KNOW; MUST USE COPAY CARD FOR EACH FILL TO BE $25  [facility] YOU! Tannen Vandezande

## 2021-05-04 ENCOUNTER — Telehealth: Payer: Self-pay | Admitting: Family

## 2021-05-04 NOTE — Telephone Encounter (Signed)
Pt has been using Mounjaro x1 month   Pt has had to start using [facility], she has new insurance ( Info below)   [employer]  ID [id number]  [facility]   RX [place] [id number]  PCN- [facility]  Per [facility] they are waiting on a PA to be completed before filling medication. If PA is not completed by Thursday pt will need a sample because she has no medication and is going out of town.

## 2021-05-05 NOTE — Telephone Encounter (Signed)
Patient called and notified that when trying to complete PA with new insurance it states that she does not have coverage. Patient advised to call insurance company and she will call us back to let us know what insurance says.

## 2021-05-05 NOTE — Telephone Encounter (Signed)
Patient was left sample to pick up until we can figure out what is going on with insurance and she will also try and use discount card.

## 2021-05-10 NOTE — Telephone Encounter (Signed)
KeyDoy Mince - PA Case ID: [id number] Need help? Call us at [phone] Status Sent to Plan today   Mounjaro 5MG /0.5ML pen-injectors

## 2021-05-10 NOTE — Telephone Encounter (Signed)
N/Atoday PA Case: [id number], Status: Closed, Closed Reason Code: FM Product not covered by this plan. Prior Authorization not available., Closed Rationale: [employer] [facility] Prior Authorization team is unable to review this product for a coverage determination as the requested medication is not on the patient's formulary. Please reach out to [employer] directly for this request. Thank you in advance.. Questions? Contact [phone]

## 2021-05-12 NOTE — Telephone Encounter (Signed)
[facility] tried [facility] and it rang x30 times with no answer x2  Could someone call later and see what the issue is?  I was told by [employer] rep that even if insurance doesn't cover med--she can still get with $25 copay card at [facility], [facility], or [facility].  I would discuss this with the pharmacist there if you are able to get them. I do not have time to call back today

## 2021-05-12 NOTE — Telephone Encounter (Signed)
Spoke to steve at pharmacy states that in changed in December that can only use the copay card if primary insurance has a copay and hers does not. Please advise what we can do for patient.

## 2021-05-12 NOTE — Telephone Encounter (Signed)
Called and spoke with patient she states the pharmacist told her she could not use copay card. Will call pharmacy to advise

## 2021-05-13 NOTE — Telephone Encounter (Signed)
Talked with [employer] Rep again and they did change it at the end of year and didn't let us know.    I would encourage patient to call her insurance to see which diabetes medications are available at a decent price.  The [employer] plan is challenging and doesn't want to cover the good brand medications (especially if she chose high deductible)  We do not have any samples and wouldn't be able to keep her in stock with a consistent supply.  Would have to do metformin plus glipizide or something generic

## 2021-05-13 NOTE — Telephone Encounter (Signed)
Pt is aware of provider feedback and states she will call her insurance to see what they may cover and call us back to let us know.

## 2021-06-07 ENCOUNTER — Telehealth: Payer: Self-pay | Admitting: *Deleted

## 2021-06-07 DIAGNOSIS — E1169 Type 2 diabetes mellitus with other specified complication: Secondary | ICD-10-CM

## 2021-06-07 NOTE — Telephone Encounter (Signed)
PA Case: [id number], Status: Closed, Closed Reason Code: FM Product not covered by this plan

## 2021-06-07 NOTE — Telephone Encounter (Signed)
Steglatro 15MG  tablets PA   Key: [id number]  Sent to Plan today

## 2021-06-08 ENCOUNTER — Other Ambulatory Visit: Payer: Self-pay | Admitting: Family

## 2021-06-08 DIAGNOSIS — I10 Essential (primary) hypertension: Secondary | ICD-10-CM

## 2021-06-08 DIAGNOSIS — I152 Hypertension secondary to endocrine disorders: Secondary | ICD-10-CM

## 2021-06-08 DIAGNOSIS — E1159 Type 2 diabetes mellitus with other circulatory complications: Secondary | ICD-10-CM

## 2021-06-08 NOTE — Telephone Encounter (Signed)
She's used her lifetime max on her steglatro copay card She will be unable to get this medication under her high deductible plan Would not complete any future PA even if [facility] continues to send them  PCP will have to substitute for generic or patient was supposed to call her insurance and let us know what diabetic medication is covered for a decent price

## 2021-06-08 NOTE — Telephone Encounter (Signed)
Continue metformin. Last A1C has been good. If glucose increases will send in glipizide.Marland Kitchen

## 2021-06-14 ENCOUNTER — Telehealth: Payer: Self-pay | Admitting: Family

## 2021-06-14 NOTE — Telephone Encounter (Signed)
Per last telephone encounter pt was to continue metformin since her insurance wasn't going to cover the steglatro anymore and if her sugars rise Alyse Low will send in glipizide. Pt is aware and voiced understanding.

## 2021-07-19 ENCOUNTER — Ambulatory Visit (INDEPENDENT_AMBULATORY_CARE_PROVIDER_SITE_OTHER): Payer: 59 | Admitting: Family

## 2021-07-19 ENCOUNTER — Encounter: Payer: Self-pay | Admitting: Family

## 2021-07-19 VITALS — BP 124/79 | HR 84 | Temp 98.2°F | Ht 64.0 in | Wt 212.6 lb

## 2021-07-19 DIAGNOSIS — K219 Gastro-esophageal reflux disease without esophagitis: Secondary | ICD-10-CM

## 2021-07-19 DIAGNOSIS — I10 Essential (primary) hypertension: Secondary | ICD-10-CM | POA: Diagnosis not present

## 2021-07-19 DIAGNOSIS — E1159 Type 2 diabetes mellitus with other circulatory complications: Secondary | ICD-10-CM

## 2021-07-19 DIAGNOSIS — E1169 Type 2 diabetes mellitus with other specified complication: Secondary | ICD-10-CM

## 2021-07-19 DIAGNOSIS — E785 Hyperlipidemia, unspecified: Secondary | ICD-10-CM

## 2021-07-19 DIAGNOSIS — E119 Type 2 diabetes mellitus without complications: Secondary | ICD-10-CM

## 2021-07-19 DIAGNOSIS — I152 Hypertension secondary to endocrine disorders: Secondary | ICD-10-CM

## 2021-07-19 DIAGNOSIS — G6289 Other specified polyneuropathies: Secondary | ICD-10-CM

## 2021-07-19 LAB — BAYER DCA HB A1C WAIVED: HB A1C (BAYER DCA - WAIVED): 6.6 % — ABNORMAL HIGH (ref 4.8–5.6)

## 2021-07-19 MED ORDER — SITAGLIPTIN PHOSPHATE 100 MG PO TABS: 100.0000 mg | ORAL_TABLET | Freq: Every day | ORAL | 1 refills | Status: DC

## 2021-07-19 MED ORDER — GABAPENTIN 300 MG PO CAPS: 300.0000 mg | ORAL_CAPSULE | Freq: Every day | ORAL | 3 refills | Status: DC

## 2021-07-19 MED ORDER — RAMIPRIL 10 MG PO CAPS: 10.0000 mg | ORAL_CAPSULE | Freq: Every day | ORAL | 3 refills | Status: DC

## 2021-07-19 MED ORDER — OMEPRAZOLE 20 MG PO CPDR: 20.0000 mg | DELAYED_RELEASE_CAPSULE | Freq: Every day | ORAL | 1 refills | Status: DC

## 2021-07-19 MED ORDER — METFORMIN HCL 1000 MG PO TABS: 1000.0000 mg | ORAL_TABLET | Freq: Two times a day (BID) | ORAL | 2 refills | Status: DC

## 2021-07-19 MED ORDER — ROSUVASTATIN CALCIUM 20 MG PO TABS: 20.0000 mg | ORAL_TABLET | Freq: Every day | ORAL | 3 refills | Status: DC

## 2021-07-19 MED ORDER — HYDROCHLOROTHIAZIDE 25 MG PO TABS: 25.0000 mg | ORAL_TABLET | Freq: Every day | ORAL | 3 refills | Status: DC

## 2021-07-19 NOTE — Progress Notes (Signed)
? ?Subjective:  ? ? Patient ID: Alexandria Tucker, female    DOB: March 09, 1956, 66 y.o.   MRN: [id number] ? ?Chief Complaint  ?Patient presents with  ? Medical Management of Chronic Issues  ?  Feet hurt and wake her up during night   ? ?PT presents to the office today for chronic follow up. She is morbid obese with a BMI of 36 with DM and HTN.  ? ?She is complaining of bilateral burning and tingling pain of feet.  ?Hypertension ?This is a chronic problem. The current episode started more than 1 year ago. The problem has been resolved since onset. The problem is controlled. Pertinent negatives include no blurred vision, malaise/fatigue, peripheral edema or shortness of breath. Risk factors for coronary artery disease include dyslipidemia, obesity and sedentary lifestyle. The current treatment provides moderate improvement. There is no history of CVA.  ?Hyperlipidemia ?This is a chronic problem. The current episode started more than 1 year ago. The problem is controlled. Exacerbating diseases include obesity. Pertinent negatives include no shortness of breath. Current antihyperlipidemic treatment includes statins. The current treatment provides moderate improvement of lipids. Risk factors for coronary artery disease include dyslipidemia, diabetes mellitus, hypertension and a sedentary lifestyle.  ?Diabetes ?She presents for her follow-up diabetic visit. She has type 2 diabetes mellitus. Associated symptoms include foot paresthesias. Pertinent negatives for diabetes include no blurred vision. Symptoms are stable. Diabetic complications include nephropathy. Pertinent negatives for diabetic complications include no CVA, heart disease or peripheral neuropathy. Risk factors for coronary artery disease include dyslipidemia and hypertension. She is following a generally unhealthy diet. Her overall blood glucose range is 180-200 mg/dl. Eye exam is current.  ?Gastroesophageal Reflux ?She complains of belching and heartburn. This  is a chronic problem. The current episode started more than 1 year ago. The problem occurs occasionally. The problem has been waxing and waning. She has tried a PPI for the symptoms. The treatment provided moderate relief.  ? ? ? ?Review of Systems  ?Constitutional:  Negative for malaise/fatigue.  ?Eyes:  Negative for blurred vision.  ?Respiratory:  Negative for shortness of breath.   ?Gastrointestinal:  Positive for heartburn.  ?All other systems reviewed and are negative. ? ?   ?Objective:  ? Physical Exam ?Vitals reviewed.  ?Constitutional:   ?   General: She is not in acute distress. ?   Appearance: She is well-developed. She is obese.  ?HENT:  ?   Head: Normocephalic and atraumatic.  ?   Right Ear: Tympanic membrane normal.  ?   Left Ear: Tympanic membrane normal.  ?Eyes:  ?   Pupils: Pupils are equal, round, and reactive to light.  ?Neck:  ?   Thyroid: No thyromegaly.  ?Cardiovascular:  ?   Rate and Rhythm: Normal rate and regular rhythm.  ?   Heart sounds: Normal heart sounds. No murmur heard. ?Pulmonary:  ?   Effort: Pulmonary effort is normal. No respiratory distress.  ?   Breath sounds: Normal breath sounds. No wheezing.  ?Abdominal:  ?   General: Bowel sounds are normal. There is no distension.  ?   Palpations: Abdomen is soft.  ?   Tenderness: There is no abdominal tenderness.  ?Musculoskeletal:     ?   General: No tenderness. Normal range of motion.  ?   Cervical back: Normal range of motion and neck supple.  ?Skin: ?   General: Skin is warm and dry.  ?Neurological:  ?   Mental Status: She is alert and oriented  to person, place, and time.  ?   Cranial Nerves: No cranial nerve deficit.  ?   Deep Tendon Reflexes: Reflexes are normal and symmetric.  ?Psychiatric:     ?   Behavior: Behavior normal.     ?   Thought Content: Thought content normal.     ?   Judgment: Judgment normal.  ? ? ? ? ? ?  BP 124/79   Pulse 84   Temp 98.2 ?F (36.8 ?C) (Temporal)   Ht '5\' 4"'  (1.626 m)   Wt 212 lb 9.6 oz (96.4 kg)    BMI 36.49 kg/m?  ? ?Assessment & Plan:  ?Jlynn A Berkland comes in today with chief complaint of Medical Management of Chronic Issues (Feet hurt and wake her up during night ) ? ? ?Diagnosis and orders addressed: ? ?1. Essential hypertension ?- ramipril (ALTACE) 10 MG capsule; Take 1 capsule (10 mg total) by mouth daily.  Dispense: 90 capsule; Refill: 3 ?- hydrochlorothiazide (HYDRODIURIL) 25 MG tablet; Take 1 tablet (25 mg total) by mouth daily.  Dispense: 90 tablet; Refill: 3 ?- CMP14+EGFR ?- CBC with Differential/Platelet ? ?2. Hypertension associated with diabetes ([place]) ?- hydrochlorothiazide (HYDRODIURIL) 25 MG tablet; Take 1 tablet (25 mg total) by mouth daily.  Dispense: 90 tablet; Refill: 3 ?- CMP14+EGFR ?- CBC with Differential/Platelet ? ?3. Hyperlipidemia associated with type 2 diabetes mellitus (HCC) ?- rosuvastatin (CRESTOR) 20 MG tablet; Take 1 tablet (20 mg total) by mouth daily.  Dispense: 90 tablet; Refill: 3 ?- CMP14+EGFR ?- CBC with Differential/Platelet ? ?4. Type 2 diabetes mellitus without complication, without long-term current use of insulin ([place]) ?Will add Januvia 100 mg ?Low carb diet  ?- metFORMIN (GLUCOPHAGE) 1000 MG tablet; Take 1 tablet (1,000 mg total) by mouth 2 (two) times daily with a meal.  Dispense: 180 tablet; Refill: 2 ?- Bayer DCA Hb A1c Waived ?- CMP14+EGFR ?- CBC with Differential/Platelet ? ?5. Morbid obesity ([place]) ?- CMP14+EGFR ?- CBC with Differential/Platelet ? ?6. Other polyneuropathy ?Start gabapentin 300 mg at bed time ?- gabapentin (NEURONTIN) 300 MG capsule; Take 1 capsule (300 mg total) by mouth at bedtime.  Dispense: 90 capsule; Refill: 3 ?- CMP14+EGFR ?- CBC with Differential/Platelet ? ?7. Gastroesophageal reflux disease, unspecified whether esophagitis present ?Start omeprazole 20 mg  ?-Diet discussed- Avoid fried, spicy, citrus foods, caffeine and alcohol ?-Do not eat 2-3 hours before bedtime ?-Encouraged small frequent meals ?-Avoid NSAID's ?- omeprazole  (PRILOSEC) 20 MG capsule; Take 1 capsule (20 mg total) by mouth daily.  Dispense: 90 capsule; Refill: 1 ? ? ?Labs pending ?Health Maintenance reviewed ?Diet and exercise encouraged ? ?Follow up plan: ?6 months  ? ? ?Evelina Dun, FNP ? ? ? ?

## 2021-07-19 NOTE — Patient Instructions (Signed)
Peripheral Neuropathy ?Peripheral neuropathy is a type of nerve damage. It affects nerves that carry signals between the spinal cord and the arms, legs, and the rest of the body (peripheral nerves). It does not affect nerves in the spinal cord or brain. In peripheral neuropathy, one nerve or a group of nerves may be damaged. Peripheral neuropathy is a broad category that includes many specific nerve disorders, like diabetic neuropathy, hereditary neuropathy, and carpal tunnel syndrome. ?What are the causes? ?This condition may be caused by: ?Diabetes. This is the most common cause of peripheral neuropathy. ?Nerve injury. ?Pressure or stress on a nerve that lasts a long time. ?Lack (deficiency) of B vitamins. This can result from alcoholism, poor diet, or a restricted diet. ?Infections. ?Autoimmune diseases, such as rheumatoid arthritis and systemic lupus erythematosus. ?Nerve diseases that are passed from parent to child (inherited). ?Some medicines, such as cancer medicines (chemotherapy). ?Poisonous (toxic) substances, such as lead and mercury. ?Too little blood flowing to the legs. ?Kidney disease. ?Thyroid disease. ?In some cases, the cause of this condition is not known. ?What are the signs or symptoms? ?Symptoms of this condition depend on which of your nerves is damaged. Common symptoms include: ?Loss of feeling (numbness) in the feet, hands, or both. ?Tingling in the feet, hands, or both. ?Burning pain. ?Very sensitive skin. ?Weakness. ?Not being able to move a part of the body (paralysis). ?Muscle twitching. ?Clumsiness or poor coordination. ?Loss of balance. ?Not being able to control your bladder. ?Feeling dizzy. ?Sexual problems. ?How is this diagnosed? ?Diagnosing and finding the cause of peripheral neuropathy can be difficult. Your health care provider will take your medical history and do a physical exam. A neurological exam will also be done. This involves checking things that are affected by your  brain, spinal cord, and nerves (nervous system). For example, your health care provider will check your reflexes, how you move, and what you can feel. ?You may have other tests, such as: ?Blood tests. ?Electromyogram (EMG) and nerve conduction tests. These tests check nerve function and how well the nerves are controlling the muscles. ?Imaging tests, such as CT scans or MRI to rule out other causes of your symptoms. ?Removing a small piece of nerve to be examined in a lab (nerve biopsy). ?Removing and examining a small amount of the fluid that surrounds the brain and spinal cord (lumbar puncture). ?How is this treated? ?Treatment for this condition may involve: ?Treating the underlying cause of the neuropathy, such as diabetes, kidney disease, or vitamin deficiencies. ?Stopping medicines that can cause neuropathy, such as chemotherapy. ?Medicine to help relieve pain. Medicines may include: ?Prescription or over-the-counter pain medicine. ?Antiseizure medicine. ?Antidepressants. ?Pain-relieving patches that are applied to painful areas of skin. ?Surgery to relieve pressure on a nerve or to destroy a nerve that is causing pain. ?Physical therapy to help improve movement and balance. ?Devices to help you move around (assistive devices). ?Follow these instructions at home: ?Medicines ?Take over-the-counter and prescription medicines only as told by your health care provider. Do not take any other medicines without first asking your health care provider. ?Do not drive or use heavy machinery while taking prescription pain medicine. ?Lifestyle ? ?Do not use any products that contain nicotine or tobacco, such as cigarettes and e-cigarettes. Smoking keeps blood from reaching damaged nerves. If you need help quitting, ask your health care provider. ?Avoid or limit alcohol. Too much alcohol can cause a vitamin B deficiency, and vitamin B is needed for healthy nerves. ?Eat a   healthy diet. This includes: ?Eating foods that are  high in fiber, such as fresh fruits and vegetables, whole grains, and beans. ?Limiting foods that are high in fat and processed sugars, such as fried or sweet foods. ?General instructions ? ?If you have diabetes, work closely with your health care provider to keep your blood sugar under control. ?If you have numbness in your feet: ?Check every day for signs of injury or infection. Watch for redness, warmth, and swelling. ?Wear padded socks and comfortable shoes. These help protect your feet. ?Develop a good support system. Living with peripheral neuropathy can be stressful. Consider talking with a mental health specialist or joining a support group. ?Use assistive devices and attend physical therapy as told by your health care provider. This may include using a walker or a cane. ?Keep all follow-up visits as told by your health care provider. This is important. ?Contact a health care provider if: ?You have new signs or symptoms of peripheral neuropathy. ?You are struggling emotionally from dealing with peripheral neuropathy. ?Your pain is not well-controlled. ?Get help right away if: ?You have an injury or infection that is not healing normally. ?You develop new weakness in an arm or leg. ?You have fallen or do so frequently. ?Summary ?Peripheral neuropathy is when the nerves in the arms, or legs are damaged, resulting in numbness, weakness, or pain. ?There are many causes of peripheral neuropathy, including diabetes, pinched nerves, vitamin deficiencies, autoimmune disease, and hereditary conditions. ?Diagnosing and finding the cause of peripheral neuropathy can be difficult. Your health care provider will take your medical history, do a physical exam, and do tests, including blood tests and nerve function tests. ?Treatment involves treating the underlying cause of the neuropathy and taking medicines to help control pain. Physical therapy and assistive devices may also help. ?This information is not intended to  replace advice given to you by your health care provider. Make sure you discuss any questions you have with your health care provider. ?Document Revised: 01/28/2020 Document Reviewed: 01/28/2020 ?Elsevier Patient Education ? 2022 [employer] ? ?

## 2021-07-20 LAB — CBC WITH DIFFERENTIAL/PLATELET
Basophils Absolute: 0.1 10*3/uL (ref 0.0–0.2)
Basos: 1 %
EOS (ABSOLUTE): 0.3 10*3/uL (ref 0.0–0.4)
Eos: 3 %
Hematocrit: 37.2 % (ref 34.0–46.6)
Hemoglobin: 12.6 g/dL (ref 11.1–15.9)
Immature Grans (Abs): 0 10*3/uL (ref 0.0–0.1)
Immature Granulocytes: 0 %
Lymphocytes Absolute: 1.5 10*3/uL (ref 0.7–3.1)
Lymphs: 17 %
MCH: 29.4 pg (ref 26.6–33.0)
MCHC: 33.9 g/dL (ref 31.5–35.7)
MCV: 87 fL (ref 79–97)
Monocytes Absolute: 0.7 10*3/uL (ref 0.1–0.9)
Monocytes: 8 %
Neutrophils Absolute: 6.5 10*3/uL (ref 1.4–7.0)
Neutrophils: 71 %
Platelets: 288 10*3/uL (ref 150–450)
RBC: 4.28 x10E6/uL (ref 3.77–5.28)
RDW: 13.7 % (ref 11.7–15.4)
WBC: 9.1 10*3/uL (ref 3.4–10.8)

## 2021-07-20 LAB — CMP14+EGFR
ALT: 35 IU/L — ABNORMAL HIGH (ref 0–32)
AST: 29 IU/L (ref 0–40)
Albumin/Globulin Ratio: 1.9 (ref 1.2–2.2)
Albumin: 4.5 g/dL (ref 3.8–4.8)
Alkaline Phosphatase: 79 IU/L (ref 44–121)
BUN/Creatinine Ratio: 25 (ref 12–28)
BUN: 20 mg/dL (ref 8–27)
Bilirubin Total: 0.6 mg/dL (ref 0.0–1.2)
CO2: 25 mmol/L (ref 20–29)
Calcium: 10.2 mg/dL (ref 8.7–10.3)
Chloride: 97 mmol/L (ref 96–106)
Creatinine, Ser: 0.81 mg/dL (ref 0.57–1.00)
Globulin, Total: 2.4 g/dL (ref 1.5–4.5)
Glucose: 149 mg/dL — ABNORMAL HIGH (ref 70–99)
Potassium: 4.5 mmol/L (ref 3.5–5.2)
Sodium: 137 mmol/L (ref 134–144)
Total Protein: 6.9 g/dL (ref 6.0–8.5)
eGFR: 81 mL/min/{1.73_m2} (ref >59)

## 2021-08-20 ENCOUNTER — Other Ambulatory Visit: Payer: Self-pay | Admitting: General Surgery

## 2021-08-20 DIAGNOSIS — Z1239 Encounter for other screening for malignant neoplasm of breast: Secondary | ICD-10-CM

## 2021-09-17 ENCOUNTER — Other Ambulatory Visit: Payer: Self-pay | Admitting: Family

## 2021-10-02 ENCOUNTER — Other Ambulatory Visit: Payer: Self-pay | Admitting: Family

## 2021-11-24 ENCOUNTER — Other Ambulatory Visit: Payer: Self-pay | Admitting: Family

## 2021-11-24 DIAGNOSIS — K219 Gastro-esophageal reflux disease without esophagitis: Secondary | ICD-10-CM

## 2021-12-25 ENCOUNTER — Other Ambulatory Visit: Payer: Self-pay | Admitting: Family

## 2022-01-17 ENCOUNTER — Encounter: Payer: Self-pay | Admitting: Family

## 2022-01-17 ENCOUNTER — Ambulatory Visit (INDEPENDENT_AMBULATORY_CARE_PROVIDER_SITE_OTHER): Payer: 59 | Admitting: Family

## 2022-01-17 VITALS — BP 113/71 | HR 84 | Temp 97.2°F | Ht 64.0 in | Wt 211.8 lb

## 2022-01-17 DIAGNOSIS — M159 Polyosteoarthritis, unspecified: Secondary | ICD-10-CM

## 2022-01-17 DIAGNOSIS — E1169 Type 2 diabetes mellitus with other specified complication: Secondary | ICD-10-CM

## 2022-01-17 DIAGNOSIS — E1159 Type 2 diabetes mellitus with other circulatory complications: Secondary | ICD-10-CM

## 2022-01-17 DIAGNOSIS — I152 Hypertension secondary to endocrine disorders: Secondary | ICD-10-CM

## 2022-01-17 DIAGNOSIS — E785 Hyperlipidemia, unspecified: Secondary | ICD-10-CM

## 2022-01-17 DIAGNOSIS — K219 Gastro-esophageal reflux disease without esophagitis: Secondary | ICD-10-CM

## 2022-01-17 DIAGNOSIS — Z23 Encounter for immunization: Secondary | ICD-10-CM

## 2022-01-17 DIAGNOSIS — Z0001 Encounter for general adult medical examination with abnormal findings: Secondary | ICD-10-CM | POA: Diagnosis not present

## 2022-01-17 DIAGNOSIS — Z0000 Encounter for general adult medical examination without abnormal findings: Secondary | ICD-10-CM

## 2022-01-17 LAB — CBC WITH DIFFERENTIAL/PLATELET
Basophils Absolute: 0.1 10*3/uL (ref 0.0–0.2)
Basos: 1 %
EOS (ABSOLUTE): 0.3 10*3/uL (ref 0.0–0.4)
Eos: 3 %
Hematocrit: 42.5 % (ref 34.0–46.6)
Hemoglobin: 14.2 g/dL (ref 11.1–15.9)
Immature Grans (Abs): 0 10*3/uL (ref 0.0–0.1)
Immature Granulocytes: 0 %
Lymphocytes Absolute: 1.8 10*3/uL (ref 0.7–3.1)
Lymphs: 18 %
MCH: 29.8 pg (ref 26.6–33.0)
MCHC: 33.4 g/dL (ref 31.5–35.7)
MCV: 89 fL (ref 79–97)
Monocytes Absolute: 0.7 10*3/uL (ref 0.1–0.9)
Monocytes: 7 %
Neutrophils Absolute: 6.8 10*3/uL (ref 1.4–7.0)
Neutrophils: 71 %
Platelets: 342 10*3/uL (ref 150–450)
RBC: 4.76 x10E6/uL (ref 3.77–5.28)
RDW: 13.5 % (ref 11.7–15.4)
WBC: 9.7 10*3/uL (ref 3.4–10.8)

## 2022-01-17 LAB — CMP14+EGFR
ALT: 59 IU/L — ABNORMAL HIGH (ref 0–32)
AST: 44 IU/L — ABNORMAL HIGH (ref 0–40)
Albumin/Globulin Ratio: 2 (ref 1.2–2.2)
Albumin: 4.8 g/dL (ref 3.9–4.9)
Alkaline Phosphatase: 77 IU/L (ref 44–121)
BUN/Creatinine Ratio: 19 (ref 12–28)
BUN: 16 mg/dL (ref 8–27)
Bilirubin Total: 0.5 mg/dL (ref 0.0–1.2)
CO2: 24 mmol/L (ref 20–29)
Calcium: 9.9 mg/dL (ref 8.7–10.3)
Chloride: 97 mmol/L (ref 96–106)
Creatinine, Ser: 0.85 mg/dL (ref 0.57–1.00)
Globulin, Total: 2.4 g/dL (ref 1.5–4.5)
Glucose: 148 mg/dL — ABNORMAL HIGH (ref 70–99)
Potassium: 4.3 mmol/L (ref 3.5–5.2)
Sodium: 139 mmol/L (ref 134–144)
Total Protein: 7.2 g/dL (ref 6.0–8.5)
eGFR: 76 mL/min/{1.73_m2} (ref >59)

## 2022-01-17 LAB — LIPID PANEL
Chol/HDL Ratio: 3.4 ratio (ref 0.0–4.4)
Cholesterol, Total: 109 mg/dL (ref 100–199)
HDL: 32 mg/dL — ABNORMAL LOW (ref >39)
LDL Chol Calc (NIH): 43 mg/dL (ref 0–99)
Triglycerides: 208 mg/dL — ABNORMAL HIGH (ref 0–149)
VLDL Cholesterol Cal: 34 mg/dL (ref 5–40)

## 2022-01-17 LAB — BAYER DCA HB A1C WAIVED: HB A1C (BAYER DCA - WAIVED): 7.5 % — ABNORMAL HIGH (ref 4.8–5.6)

## 2022-01-17 LAB — TSH: TSH: 5.05 u[IU]/mL — ABNORMAL HIGH (ref 0.450–4.500)

## 2022-01-17 MED ORDER — MELOXICAM 15 MG PO TABS: 15.0000 mg | ORAL_TABLET | Freq: Every day | ORAL | 1 refills | Status: DC

## 2022-01-17 NOTE — Patient Instructions (Signed)

## 2022-01-17 NOTE — Progress Notes (Signed)
Subjective:    Patient ID: Alexandria Tucker, female    DOB: [birth year], 66 y.o.   MRN: [id number]  Chief Complaint  Patient presents with   Medical Management of Chronic Issues   PT presents to the office today for CPE and chronic follow up. She is morbid obese with a BMI of 36 with DM and HTN.    She is complaining of bilateral burning and tingling pain of feet.  Hypertension This is a chronic problem. The current episode started more than 1 year ago. The problem has been resolved since onset. The problem is controlled. Pertinent negatives include no blurred vision, malaise/fatigue, peripheral edema or shortness of breath. Risk factors for coronary artery disease include dyslipidemia, diabetes mellitus, obesity and sedentary lifestyle. Past treatments include ACE inhibitors. The current treatment provides moderate improvement. There is no history of CVA or heart failure.  Gastroesophageal Reflux She complains of belching and heartburn. This is a chronic problem. The current episode started more than 1 year ago. The problem occurs occasionally. She has tried a PPI for the symptoms. The treatment provided moderate relief.  Hyperlipidemia This is a chronic problem. The current episode started more than 1 year ago. The problem is controlled. Recent lipid tests were reviewed and are normal. Exacerbating diseases include obesity. Pertinent negatives include no shortness of breath. Current antihyperlipidemic treatment includes statins. The current treatment provides moderate improvement of lipids. Risk factors for coronary artery disease include dyslipidemia, diabetes mellitus, hypertension and a sedentary lifestyle.  Diabetes She presents for her follow-up diabetic visit. She has type 2 diabetes mellitus. Pertinent negatives for diabetes include no blurred vision. Symptoms are stable. Pertinent negatives for diabetic complications include no CVA. Risk factors for coronary artery disease include  dyslipidemia, diabetes mellitus, hypertension and sedentary lifestyle. She is following a generally healthy diet. (Does not check glucose at home) Eye exam is current.  Arthritis Presents for follow-up visit. She complains of pain and stiffness. The symptoms have been stable. Affected locations include the left knee, right knee, left MCP and right MCP (back). Her pain is at a severity of 8/10.      Review of Systems  Constitutional:  Negative for malaise/fatigue.  Eyes:  Negative for blurred vision.  Respiratory:  Negative for shortness of breath.   Gastrointestinal:  Positive for heartburn.  Musculoskeletal:  Positive for arthritis and stiffness.  All other systems reviewed and are negative.  Family History  Problem Relation Age of Onset   Hypertension Mother    Heart disease Father    Heart disease Brother    Hypertension Brother    Hypertension Brother    Social History   Socioeconomic History   Marital status: Married    Spouse name: Not on file   Number of children: Not on file   Years of education: Not on file   Highest education level: Not on file  Occupational History   Not on file  Tobacco Use   Smoking status: Never   Smokeless tobacco: Never  Vaping Use   Vaping Use: Never used  Substance and Sexual Activity   Alcohol use: Yes    Alcohol/week: 0.0 standard drinks of alcohol    Comment: social   Drug use: No   Sexual activity: Not on file  Other Topics Concern   Not on file  Social History Narrative   Not on file   Social Determinants of Health   Financial Resource Strain: Not on file  Food Insecurity: Not on file  Transportation Needs: Not on file  Physical Activity: Not on file  Stress: Not on file  Social Connections: Not on file       Objective:   Physical Exam Vitals reviewed.  Constitutional:      General: She is not in acute distress.    Appearance: She is well-developed. She is obese.  HENT:     Head: Normocephalic and atraumatic.      Right Ear: Tympanic membrane normal.     Left Ear: Tympanic membrane normal.  Eyes:     Pupils: Pupils are equal, round, and reactive to light.  Neck:     Thyroid: No thyromegaly.  Cardiovascular:     Rate and Rhythm: Normal rate and regular rhythm.     Heart sounds: Normal heart sounds. No murmur heard. Pulmonary:     Effort: Pulmonary effort is normal. No respiratory distress.     Breath sounds: Normal breath sounds. No wheezing.  Abdominal:     General: Bowel sounds are normal. There is no distension.     Palpations: Abdomen is soft.     Tenderness: There is no abdominal tenderness.  Musculoskeletal:        General: No tenderness. Normal range of motion.     Cervical back: Normal range of motion and neck supple.  Skin:    General: Skin is warm and dry.  Neurological:     Mental Status: She is alert and oriented to person, place, and time.     Cranial Nerves: No cranial nerve deficit.     Deep Tendon Reflexes: Reflexes are normal and symmetric.  Psychiatric:        Behavior: Behavior normal.        Thought Content: Thought content normal.        Judgment: Judgment normal.     Diabetic Foot Exam - Simple   Simple Foot Form Diabetic Foot exam was performed with the following findings: Yes 01/17/2022  8:25 AM  Visual Inspection No deformities, no ulcerations, no other skin breakdown bilaterally: Yes Sensation Testing Intact to touch and monofilament testing bilaterally: Yes Pulse Check Posterior Tibialis and Dorsalis pulse intact bilaterally: Yes Comments      BP 113/71   Pulse 84   Temp (!) 97.2 F (36.2 C) (Temporal)   Ht 5' 4" (1.626 m)   Wt 211 lb 12.8 oz (96.1 kg)   SpO2 98%   BMI 36.36 kg/m      Assessment & Plan:  Alexandria Tucker comes in today with chief complaint of Medical Management of Chronic Issues   Diagnosis and orders addressed:  1. Type 2 diabetes mellitus with other specified complication, without long-term current use of insulin  (HCC) - Bayer DCA Hb A1c Waived - CMP14+EGFR - CBC with Differential/Platelet - Microalbumin / creatinine urine ratio  2. Hyperlipidemia associated with type 2 diabetes mellitus ([place]) - CMP14+EGFR - CBC with Differential/Platelet - Lipid panel  3. Gastroesophageal reflux disease, unspecified whether esophagitis present - CMP14+EGFR - CBC with Differential/Platelet  4. Hypertension associated with diabetes ([place]) - CMP14+EGFR - CBC with Differential/Platelet  5. Morbid obesity ([place]) - CMP14+EGFR - CBC with Differential/Platelet  6. Need for immunization against influenza - Flu Vaccine QUAD 100moIM (Fluarix, Fluzone & Alfiuria Quad PF) - CMP14+EGFR - CBC with Differential/Platelet  7. Annual physical exam - Bayer DCA Hb A1c Waived - CMP14+EGFR - CBC with Differential/Platelet - Lipid panel - TSH - Microalbumin / creatinine urine ratio  8. Primary osteoarthritis involving multiple joints Start  Mobic  Avoid NSAID's  - meloxicam (MOBIC) 15 MG tablet; Take 1 tablet (15 mg total) by mouth daily.  Dispense: 90 tablet; Refill: 1   Labs pending Health Maintenance reviewed Diet and exercise encouraged  Follow up plan: 6 months    Evelina Dun, FNP

## 2022-01-18 LAB — MICROALBUMIN / CREATININE URINE RATIO
Creatinine, Urine: 84.5 mg/dL
Microalb/Creat Ratio: 193 mg/g creat — ABNORMAL HIGH (ref 0–29)
Microalbumin, Urine: 163.5 ug/mL

## 2022-01-20 ENCOUNTER — Other Ambulatory Visit: Payer: Self-pay | Admitting: Family

## 2022-01-20 DIAGNOSIS — R748 Abnormal levels of other serum enzymes: Secondary | ICD-10-CM

## 2022-01-24 ENCOUNTER — Telehealth: Payer: Self-pay | Admitting: Family

## 2022-01-24 NOTE — Telephone Encounter (Signed)
Pt called to let us know that she cant come to the [place] at [facility] on 01/28/22 because she is in [place] and wont be back until 01/29/22. Pt needs this appt to be rescheduled.  Also she needs to know if anyone found out how much insurance will cover?

## 2022-01-28 ENCOUNTER — Ambulatory Visit (HOSPITAL_COMMUNITY): Payer: 59

## 2022-01-31 ENCOUNTER — Ambulatory Visit (HOSPITAL_COMMUNITY): Payer: 59

## 2022-02-14 ENCOUNTER — Ambulatory Visit (HOSPITAL_COMMUNITY)
Admission: RE | Admit: 2022-02-14 | Discharge: 2022-02-14 | Disposition: A | Discharge status: Home / Self Care | Payer: 59 | Source: Ambulatory Visit | Attending: Family | Admitting: Family

## 2022-02-14 ENCOUNTER — Other Ambulatory Visit: Payer: Self-pay | Admitting: Family

## 2022-02-14 DIAGNOSIS — K8020 Calculus of gallbladder without cholecystitis without obstruction: Secondary | ICD-10-CM | POA: Diagnosis not present

## 2022-02-14 DIAGNOSIS — R748 Abnormal levels of other serum enzymes: Secondary | ICD-10-CM | POA: Diagnosis not present

## 2022-02-14 DIAGNOSIS — R7989 Other specified abnormal findings of blood chemistry: Secondary | ICD-10-CM | POA: Diagnosis not present

## 2022-02-14 DIAGNOSIS — N2889 Other specified disorders of kidney and ureter: Secondary | ICD-10-CM

## 2022-02-17 ENCOUNTER — Other Ambulatory Visit: Payer: Self-pay | Admitting: Family

## 2022-02-17 DIAGNOSIS — N2889 Other specified disorders of kidney and ureter: Secondary | ICD-10-CM

## 2022-02-25 LAB — HM DIABETES EYE EXAM

## 2022-03-04 ENCOUNTER — Encounter: Payer: Self-pay | Admitting: General Surgery

## 2022-03-08 ENCOUNTER — Other Ambulatory Visit: Payer: Self-pay | Admitting: General Surgery

## 2022-03-08 DIAGNOSIS — D242 Benign neoplasm of left breast: Secondary | ICD-10-CM

## 2022-03-12 ENCOUNTER — Other Ambulatory Visit: Payer: 59

## 2022-03-29 ENCOUNTER — Encounter: Payer: Self-pay | Admitting: Family

## 2022-03-29 ENCOUNTER — Ambulatory Visit (INDEPENDENT_AMBULATORY_CARE_PROVIDER_SITE_OTHER): Payer: 59 | Admitting: Family

## 2022-03-29 VITALS — BP 102/61 | HR 80 | Temp 97.5°F | Resp 20 | Ht 64.0 in | Wt 210.0 lb

## 2022-03-29 DIAGNOSIS — R748 Abnormal levels of other serum enzymes: Secondary | ICD-10-CM

## 2022-03-29 DIAGNOSIS — E1169 Type 2 diabetes mellitus with other specified complication: Secondary | ICD-10-CM | POA: Diagnosis not present

## 2022-03-29 DIAGNOSIS — K760 Fatty (change of) liver, not elsewhere classified: Secondary | ICD-10-CM | POA: Diagnosis not present

## 2022-03-29 DIAGNOSIS — N2889 Other specified disorders of kidney and ureter: Secondary | ICD-10-CM

## 2022-03-29 DIAGNOSIS — K8010 Calculus of gallbladder with chronic cholecystitis without obstruction: Secondary | ICD-10-CM | POA: Diagnosis not present

## 2022-03-29 DIAGNOSIS — R7989 Other specified abnormal findings of blood chemistry: Secondary | ICD-10-CM | POA: Diagnosis not present

## 2022-03-29 NOTE — Patient Instructions (Signed)

## 2022-03-29 NOTE — Progress Notes (Signed)
Subjective:    Patient ID: Alexandria Tucker, female    DOB: [birth year], 66 y.o.   MRN: [id number]  Pt presents to the office today to recheck TSH. She was seen on 12/31/21 and had a TSH of 5.05.   She also had a bump in her A1C to 7.5 from 6.6.  Her liver enzymes were elevated and we ordered an [place]. This showed, "1. There is a 2.3 cm complex cystic mass within the midpole of the left kidney, with possible internal septations and associated vascularity. Recommend dedicated evaluation with pre and post contrast-enhanced abdominal MRI. 2. Cholelithiasis without secondary signs to suggest acute cholecystitis. 3. There is a 2.6 cm sludge ball versus polyp within the gallbladder lumen. Recommend follow-up ultrasound in 6 months to reassess. 4. Increased hepatic parenchymal echogenicity suggestive of steatosis. 5. These results will be called to the ordering clinician or representative by the Radiologist Assistant, and communication documented in the PACS or [facility] Dashboard."  MR pending. She states they wanted $2400 upfront and she cancelled. She states her insurance is changing in March and wants to wait.  Thyroid Problem Presents for follow-up visit. Patient reports no depressed mood, hair loss, heat intolerance or hoarse voice. The symptoms have been stable.  Diabetes She presents for her follow-up diabetic visit. She has type 2 diabetes mellitus. Pertinent negatives for diabetes include no blurred vision and no foot paresthesias. Symptoms are stable. Risk factors for coronary artery disease include dyslipidemia, diabetes mellitus, hypertension and sedentary lifestyle.      Review of Systems  HENT:  Negative for hoarse voice.   Eyes:  Negative for blurred vision.  Endocrine: Negative for heat intolerance.  All other systems reviewed and are negative.      Objective:   Physical Exam Vitals reviewed.  Constitutional:      General: She is not in acute distress.    Appearance:  She is well-developed. She is obese.  HENT:     Head: Normocephalic and atraumatic.     Right Ear: Tympanic membrane normal.     Left Ear: Tympanic membrane normal.  Eyes:     Pupils: Pupils are equal, round, and reactive to light.  Neck:     Thyroid: No thyromegaly.  Cardiovascular:     Rate and Rhythm: Normal rate and regular rhythm.     Heart sounds: Normal heart sounds. No murmur heard. Pulmonary:     Effort: Pulmonary effort is normal. No respiratory distress.     Breath sounds: Normal breath sounds. No wheezing.  Abdominal:     General: Bowel sounds are normal. There is no distension.     Palpations: Abdomen is soft.     Tenderness: There is no abdominal tenderness.  Musculoskeletal:        General: No tenderness. Normal range of motion.     Cervical back: Normal range of motion and neck supple.  Skin:    General: Skin is warm and dry.  Neurological:     Mental Status: She is alert and oriented to person, place, and time.     Cranial Nerves: No cranial nerve deficit.     Deep Tendon Reflexes: Reflexes are normal and symmetric.  Psychiatric:        Behavior: Behavior normal.        Thought Content: Thought content normal.        Judgment: Judgment normal.      BP 102/61   Pulse 80   Temp (!) 97.5 F (36.4  C) (Temporal)   Resp 20   Ht _0  (1.626 m)   Wt 210 lb (95.3 kg)   SpO2 97%   BMI 36.05 kg/m      Assessment & Plan:   Alexandria Tucker comes in today with chief complaint of No chief complaint on file.   Diagnosis and orders addressed:  1. Abnormal TSH - CMP14+EGFR - TSH  2. Type 2 diabetes mellitus with other specified complication, without long-term current use of insulin (HCC) - CMP14+EGFR  3. Elevated liver enzymes - CMP14+EGFR  4. Hepatic steatosis - CMP14+EGFR  5. Kidney mass - CMP14+EGFR  6. Calculus of gallbladder with chronic cholecystitis without obstruction - CMP14+EGFR   Labs pending Wants to hold off on MR and referral  to specialists until new insurance Health Maintenance reviewed Diet and exercise encouraged  Follow up plan: 3 month   Evelina Dun, FNP

## 2022-03-30 LAB — CMP14+EGFR
ALT: 72 IU/L — ABNORMAL HIGH (ref 0–32)
AST: 61 IU/L — ABNORMAL HIGH (ref 0–40)
Albumin/Globulin Ratio: 2.1 (ref 1.2–2.2)
Albumin: 4.8 g/dL (ref 3.9–4.9)
Alkaline Phosphatase: 81 IU/L (ref 44–121)
BUN/Creatinine Ratio: 25 (ref 12–28)
BUN: 24 mg/dL (ref 8–27)
Bilirubin Total: 0.5 mg/dL (ref 0.0–1.2)
CO2: 26 mmol/L (ref 20–29)
Calcium: 10 mg/dL (ref 8.7–10.3)
Chloride: 96 mmol/L (ref 96–106)
Creatinine, Ser: 0.96 mg/dL (ref 0.57–1.00)
Globulin, Total: 2.3 g/dL (ref 1.5–4.5)
Glucose: 134 mg/dL — ABNORMAL HIGH (ref 70–99)
Potassium: 4.4 mmol/L (ref 3.5–5.2)
Sodium: 136 mmol/L (ref 134–144)
Total Protein: 7.1 g/dL (ref 6.0–8.5)
eGFR: 66 mL/min/{1.73_m2} (ref >59)

## 2022-03-30 LAB — TSH: TSH: 3.82 u[IU]/mL (ref 0.450–4.500)

## 2022-04-09 ENCOUNTER — Other Ambulatory Visit: Payer: Self-pay | Admitting: Family

## 2022-04-09 DIAGNOSIS — K219 Gastro-esophageal reflux disease without esophagitis: Secondary | ICD-10-CM

## 2022-04-09 DIAGNOSIS — E119 Type 2 diabetes mellitus without complications: Secondary | ICD-10-CM

## 2022-05-11 DIAGNOSIS — E1169 Type 2 diabetes mellitus with other specified complication: Secondary | ICD-10-CM

## 2022-06-02 ENCOUNTER — Telehealth: Payer: Self-pay | Admitting: Family

## 2022-06-02 DIAGNOSIS — E1169 Type 2 diabetes mellitus with other specified complication: Secondary | ICD-10-CM

## 2022-06-02 NOTE — Telephone Encounter (Signed)
REFERRAL REQUEST Telephone Note  Have you been seen at our office for this problem? yes (Advise that they may need an appointment with their PCP before a referral can be done)  Reason for Referral: Blood sugar management Referral discussed with patient: yes  Best contact number of patient for referral team: [phone]    Has patient been seen by a specialist for this issue before: no  Patient provider preference for referral: Dr Lucilla Lame, MD [place]. [place], [place] Patient location preference for referral:  Pt has [employer] insurance Member [id number]   Patient notified that referrals can take up to a week or longer to process. If they haven't heard anything within a week they should call back and speak with the referral department.

## 2022-06-06 NOTE — Telephone Encounter (Signed)
Referral placed.

## 2022-06-24 ENCOUNTER — Ambulatory Visit
Admission: RE | Admit: 2022-06-24 | Discharge: 2022-06-24 | Disposition: A | Discharge status: Home / Self Care | Payer: Medicare HMO | Source: Ambulatory Visit | Attending: General Surgery | Admitting: General Surgery

## 2022-06-24 ENCOUNTER — Ambulatory Visit
Admission: RE | Admit: 2022-06-24 | Discharge: 2022-06-24 | Disposition: A | Discharge status: Home / Self Care | Payer: 59 | Source: Ambulatory Visit | Attending: General Surgery | Admitting: General Surgery

## 2022-06-24 DIAGNOSIS — D242 Benign neoplasm of left breast: Secondary | ICD-10-CM | POA: Diagnosis not present

## 2022-06-24 DIAGNOSIS — N6323 Unspecified lump in the left breast, lower outer quadrant: Secondary | ICD-10-CM | POA: Diagnosis not present

## 2022-06-24 DIAGNOSIS — R928 Other abnormal and inconclusive findings on diagnostic imaging of breast: Secondary | ICD-10-CM | POA: Diagnosis not present

## 2022-06-24 DIAGNOSIS — N6321 Unspecified lump in the left breast, upper outer quadrant: Secondary | ICD-10-CM | POA: Diagnosis not present

## 2022-06-27 NOTE — Telephone Encounter (Signed)
Pt says that Dr Lucilla Lame, MD [facility] has not received the referral [place]. [place], [place] Fax [phone] Pt got a message through portal about this referral.

## 2022-06-27 NOTE — Telephone Encounter (Signed)
Alexandria Tucker [facility] Rep) called to make sure referral for MRI was sent to them for approval and not patients former insurance plan which was through [place].   Explained that MRI had been scheduled so I would assume that it was approved and looks like [employer] ID# and plan is noted in referral request.  Alexandria Tucker voiced understanding and said that she wanted to double check because on her end its not showing up in the patients benefit tab.

## 2022-06-29 ENCOUNTER — Encounter: Payer: Self-pay | Admitting: Physical Medicine and Rehabilitation

## 2022-06-29 ENCOUNTER — Ambulatory Visit: Payer: Self-pay

## 2022-06-29 ENCOUNTER — Ambulatory Visit (INDEPENDENT_AMBULATORY_CARE_PROVIDER_SITE_OTHER): Payer: Medicare HMO | Admitting: Physical Medicine and Rehabilitation

## 2022-06-29 VITALS — BP 112/73 | HR 103

## 2022-06-29 DIAGNOSIS — M5441 Lumbago with sciatica, right side: Secondary | ICD-10-CM | POA: Diagnosis not present

## 2022-06-29 DIAGNOSIS — M47816 Spondylosis without myelopathy or radiculopathy, lumbar region: Secondary | ICD-10-CM

## 2022-06-29 DIAGNOSIS — M5416 Radiculopathy, lumbar region: Secondary | ICD-10-CM

## 2022-06-29 DIAGNOSIS — G8929 Other chronic pain: Secondary | ICD-10-CM | POA: Diagnosis not present

## 2022-06-29 NOTE — Progress Notes (Signed)
Alexandria Tucker - 67 y.o. female MRN [id number]  Date of birth: [birth year]  Office Visit Note: Visit Date: 06/29/2022 PCP: Alexandria Balloon, FNP Referred by: Alexandria Balloon, FNP  Subjective: Chief Complaint  Patient presents with   Lower Back - Pain   HPI: Alexandria Tucker is a 67 y.o. female who comes in today for evaluation of chronic, worsening and severe right sided lower back radiating to buttock, hip and posterolateral leg down to foot, last seen in our office in 2016. She was originally referred to [place] by Dr. Rodell Perna. Pain worsens with standing and walking. She describes her pain as a burning sensation radiating down her leg, currently rates as 8 out of 10. Some relief of pain with home exercise regimen, heating pad, rest and medications. Does use Biofreeze occasionally. Currently taking Meloxicam and Gabapentin prescribed by PCP. Lumbar MRI imaging from 2015 exhibits severe facet disease and diffuse annular bulge at L4-L5 contributing to mild bilateral lateral recess stenosis and early spinal stenosis. There is shallow right foraminal disc protrusion potentially irritating right L3 nerve root. No high grade spinal canal stenosis noted. History of right L3 and L4 transforaminal epidural steroid (2015) injection and multiple right L4-L5 and L5-S1 intra-articular facet joint injections (2016) in our office with good relief of pain. We recently treated her husband Alexandria Tucker, during his visit she requested an evaluation as her pain returned several months ago and is now negatively impacting functional ability. Patient denies focal weakness, numbness and tingling. No recent trauma or falls.    Oswestry Disability Index Score 32% 10 to 20 (40%) moderate disability: The patient experiences more pain and difficulty with sitting, lifting and standing. Travel and social life are more difficult, and they may be disabled from work. Personal care, sexual activity and sleeping are not grossly affected,  and the patient can usually be managed by conservative means.  Review of Systems  Musculoskeletal:  Positive for back pain.  Neurological:  Negative for tingling, sensory change, focal weakness and weakness.  All other systems reviewed and are negative.  Otherwise per HPI.  Assessment & Plan: Visit Diagnoses:    ICD-10-CM   1. Chronic right-sided low back pain with right-sided sciatica  M54.41 XR Lumbar Spine 2-3 Views   G89.29 MR LUMBAR SPINE WO CONTRAST    2. Lumbar radiculopathy  M54.16 MR LUMBAR SPINE WO CONTRAST    3. Facet arthropathy, lumbar  M47.816 MR LUMBAR SPINE WO CONTRAST       Plan: Findings:  Chronic, worsening and severe right sided lower back radiating to buttock, hip and posterolateral leg down to foot. Patient continues to have severe pain despite good conservative therapy such as home exercise regimen, rest and use of medications.  Patient's clinical presentation and exam are consistent with L5 nerve pattern.  We did obtain 2 view lumbar x-rays in the office today that exhibit anterolisthesis of L4 on L5 and advanced facet arthropathy at L4-L5 and L5-S1.  Next step is to obtain new lumbar MRI imaging.  Her symptoms are consistent with classic spinal canal stenosis as she does have severe pain with prolonged standing and walking.  Depending on results of lumbar MRI imaging we did discuss the possibility of epidural steroid injection.  If her pain seems to be more lower back we can also look at repeating facet joint injections.  We will have patient follow-up to discuss lumbar MRI results and further treatment. No red flag symptoms noted upon exam today.  Meds & Orders: No orders of the defined types were placed in this encounter.   Orders Placed This Encounter  Procedures   XR Lumbar Spine 2-3 Views   MR LUMBAR SPINE WO CONTRAST    Follow-up: Return for follow up for lumbar MRI review.   Procedures: No procedures performed      Clinical History: Lumbar MRI  2015  Impression: 1. Broad based disc protrusion at T12-L1. 2. Diffuse annular bulge and moderate facet disease at L3-L4 contributing to early spinal stenosis and mild bilateral lateral recess stenosis. There is also a shallow right foraminal disc protrusion potentially irritating the right L3 nerve root. 3. Severe facet disease and diffuse annular bulge at L4-L5 contributing to mild bilateral lateral recess stenosis and early spinal stenosis. Mild left greater than right foraminal encroachment also.   She reports that she has never smoked. She has never used smokeless tobacco.  Recent Labs    07/19/21 0849 01/17/22 0852  HGBA1C 6.6* 7.5*    Objective:  VS:  HT:    WT:   BMI:     BP:112/73  HR:(!) 103bpm  TEMP: ( )  RESP:  Physical Exam Vitals and nursing note reviewed.  HENT:     Head: Normocephalic and atraumatic.     Right Ear: External ear normal.     Left Ear: External ear normal.     Nose: Nose normal.     Mouth/Throat:     Mouth: Mucous membranes are moist.  Eyes:     Extraocular Movements: Extraocular movements intact.  Cardiovascular:     Rate and Rhythm: Normal rate.     Pulses: Normal pulses.  Pulmonary:     Effort: Pulmonary effort is normal.  Abdominal:     General: Abdomen is flat. There is no distension.  Musculoskeletal:        General: Tenderness present.     Cervical back: Normal range of motion.     Comments: Pt rises from seated position to standing without difficulty. Good lumbar range of motion. Strong distal strength without clonus, no pain upon palpation of greater trochanters. Sensation intact bilaterally. Dysesthesias noted to right L5 dermatome. Walks independently, gait steady. Positive slump test on the right.    Skin:    General: Skin is warm and dry.     Capillary Refill: Capillary refill takes less than 2 seconds.  Neurological:     General: No focal deficit present.     Mental Status: She is alert and oriented to person, place, and  time.  Psychiatric:        Mood and Affect: Mood normal.        Behavior: Behavior normal.     Ortho Exam  Imaging: No results found.  Past Medical/Family/Surgical/Social History: Medications & Allergies reviewed per EMR, new medications updated. Patient Active Problem List   Diagnosis Date Noted   Calculus of gallbladder with chronic cholecystitis without obstruction 03/29/2022   Kidney mass 03/29/2022   Hepatic steatosis 03/29/2022   Gastroesophageal reflux disease 07/19/2021   Morbid obesity ([place]) 01/22/2016   Allergic rhinitis 01/02/2015   Diabetes mellitus ([place]) 07/04/2014   Hyperlipidemia associated with type 2 diabetes mellitus ([place]) 06/21/2013   Hypertension associated with diabetes ([place]) 06/21/2013   Past Medical History:  Diagnosis Date   Breast mass, right    Diabetes mellitus without complication ([place])    History of kidney stones    Hyperlipidemia    Hypertension    Family History  Problem Relation Age  of Onset   Hypertension Mother    Heart disease Father    Heart disease Brother    Hypertension Brother    Hypertension Brother    Past Surgical History:  Procedure Laterality Date   ABDOMINAL HYSTERECTOMY     BREAST EXCISIONAL BIOPSY Right 2017   Excision for Rt. Infected Duct   BREAST LUMPECTOMY WITH RADIOACTIVE SEED LOCALIZATION Right 04/01/2016   Procedure: RIGHT BREAST LUMPECTOMY WITH RADIOACTIVE SEED LOCALIZATION;  Surgeon: Excell Seltzer, MD;  Location: [facility];  Service: General;  Laterality: Right;   CESAREAN SECTION     CYSTOSCOPY/RETROGRADE/URETEROSCOPY Right 11/23/2016   Procedure: CYSTOSCOPY/RIGHT RETROGRADE/URETEROSCOPY LASER LITHOTRIPSY AND STONE REMOVAL ;  Surgeon: Raynelle Bring, MD;  Location: [facility];  Service: Urology;  Laterality: Right;   Kidney stones     TUBAL LIGATION     Social History   Occupational History   Not on file  Tobacco Use   Smoking status: Never   Smokeless tobacco: Never  Vaping Use    Vaping Use: Never used  Substance and Sexual Activity   Alcohol use: Yes    Alcohol/week: 0.0 standard drinks of alcohol    Comment: social   Drug use: No   Sexual activity: Not on file

## 2022-06-29 NOTE — Progress Notes (Signed)
Right sided LBP with leg pain off and on for a couple months. Burning down right leg. Sometimes on left. Worse when standing and walking. Said had ESI here in past which helped. Tried heat, meloxicam, gabapentin and biofreeze

## 2022-07-01 ENCOUNTER — Ambulatory Visit: Admission: RE | Admit: 2022-07-01 | Payer: Medicare HMO | Source: Ambulatory Visit

## 2022-07-05 ENCOUNTER — Telehealth: Payer: Self-pay | Admitting: Family

## 2022-07-07 DIAGNOSIS — E1165 Type 2 diabetes mellitus with hyperglycemia: Secondary | ICD-10-CM | POA: Diagnosis not present

## 2022-07-07 DIAGNOSIS — R809 Proteinuria, unspecified: Secondary | ICD-10-CM | POA: Diagnosis not present

## 2022-07-07 DIAGNOSIS — E785 Hyperlipidemia, unspecified: Secondary | ICD-10-CM | POA: Diagnosis not present

## 2022-07-07 DIAGNOSIS — E1129 Type 2 diabetes mellitus with other diabetic kidney complication: Secondary | ICD-10-CM | POA: Diagnosis not present

## 2022-07-07 DIAGNOSIS — E1169 Type 2 diabetes mellitus with other specified complication: Secondary | ICD-10-CM | POA: Diagnosis not present

## 2022-07-07 DIAGNOSIS — E669 Obesity, unspecified: Secondary | ICD-10-CM | POA: Diagnosis not present

## 2022-07-07 DIAGNOSIS — E1140 Type 2 diabetes mellitus with diabetic neuropathy, unspecified: Secondary | ICD-10-CM | POA: Diagnosis not present

## 2022-07-07 NOTE — Telephone Encounter (Signed)
Pt.notified

## 2022-07-07 NOTE — Telephone Encounter (Signed)
Pt had Endo follow up today.   Evelina Dun, FNP

## 2022-07-09 ENCOUNTER — Other Ambulatory Visit: Payer: Self-pay | Admitting: Family

## 2022-07-09 ENCOUNTER — Ambulatory Visit
Admission: RE | Admit: 2022-07-09 | Discharge: 2022-07-09 | Disposition: A | Discharge status: Home / Self Care | Payer: Medicare HMO | Source: Ambulatory Visit | Attending: Physical Medicine and Rehabilitation | Admitting: Physical Medicine and Rehabilitation

## 2022-07-09 DIAGNOSIS — G8929 Other chronic pain: Secondary | ICD-10-CM

## 2022-07-09 DIAGNOSIS — M48061 Spinal stenosis, lumbar region without neurogenic claudication: Secondary | ICD-10-CM | POA: Diagnosis not present

## 2022-07-09 DIAGNOSIS — M159 Polyosteoarthritis, unspecified: Secondary | ICD-10-CM

## 2022-07-09 DIAGNOSIS — M545 Low back pain, unspecified: Secondary | ICD-10-CM | POA: Diagnosis not present

## 2022-07-09 DIAGNOSIS — E119 Type 2 diabetes mellitus without complications: Secondary | ICD-10-CM

## 2022-07-09 DIAGNOSIS — M4316 Spondylolisthesis, lumbar region: Secondary | ICD-10-CM | POA: Diagnosis not present

## 2022-07-09 DIAGNOSIS — M47816 Spondylosis without myelopathy or radiculopathy, lumbar region: Secondary | ICD-10-CM

## 2022-07-09 DIAGNOSIS — M5416 Radiculopathy, lumbar region: Secondary | ICD-10-CM

## 2022-07-12 DIAGNOSIS — L821 Other seborrheic keratosis: Secondary | ICD-10-CM | POA: Diagnosis not present

## 2022-07-12 DIAGNOSIS — B353 Tinea pedis: Secondary | ICD-10-CM | POA: Diagnosis not present

## 2022-07-12 DIAGNOSIS — D485 Neoplasm of uncertain behavior of skin: Secondary | ICD-10-CM | POA: Diagnosis not present

## 2022-07-12 DIAGNOSIS — L570 Actinic keratosis: Secondary | ICD-10-CM | POA: Diagnosis not present

## 2022-07-12 DIAGNOSIS — L738 Other specified follicular disorders: Secondary | ICD-10-CM | POA: Diagnosis not present

## 2022-07-14 ENCOUNTER — Other Ambulatory Visit: Payer: Self-pay | Admitting: Physical Medicine and Rehabilitation

## 2022-07-14 ENCOUNTER — Other Ambulatory Visit: Payer: Medicare HMO

## 2022-07-14 ENCOUNTER — Telehealth: Payer: Self-pay | Admitting: Physical Medicine and Rehabilitation

## 2022-07-14 DIAGNOSIS — M5416 Radiculopathy, lumbar region: Secondary | ICD-10-CM

## 2022-07-14 NOTE — Telephone Encounter (Signed)
Spoke with patient this afternoon, she would like to move forward with injection. I did place order. Will talk about recent lumbar MRI imaging in detail when she comes in for procedure.

## 2022-07-18 ENCOUNTER — Other Ambulatory Visit: Payer: Self-pay | Admitting: Family Medicine

## 2022-07-18 ENCOUNTER — Ambulatory Visit: Payer: Medicare HMO | Admitting: Family

## 2022-07-18 ENCOUNTER — Other Ambulatory Visit: Payer: Medicare HMO

## 2022-07-18 DIAGNOSIS — E1169 Type 2 diabetes mellitus with other specified complication: Secondary | ICD-10-CM | POA: Diagnosis not present

## 2022-07-18 DIAGNOSIS — E785 Hyperlipidemia, unspecified: Secondary | ICD-10-CM | POA: Diagnosis not present

## 2022-07-18 DIAGNOSIS — R7989 Other specified abnormal findings of blood chemistry: Secondary | ICD-10-CM | POA: Diagnosis not present

## 2022-07-19 ENCOUNTER — Other Ambulatory Visit: Payer: Self-pay | Admitting: Family

## 2022-07-19 LAB — CBC WITH DIFFERENTIAL/PLATELET
Basophils Absolute: 0.1 10*3/uL (ref 0.0–0.2)
Basos: 1 %
EOS (ABSOLUTE): 0.3 10*3/uL (ref 0.0–0.4)
Eos: 4 %
Hematocrit: 41 % (ref 34.0–46.6)
Hemoglobin: 13.4 g/dL (ref 11.1–15.9)
Immature Grans (Abs): 0 10*3/uL (ref 0.0–0.1)
Immature Granulocytes: 0 %
Lymphocytes Absolute: 1.6 10*3/uL (ref 0.7–3.1)
Lymphs: 18 %
MCH: 29.2 pg (ref 26.6–33.0)
MCHC: 32.7 g/dL (ref 31.5–35.7)
MCV: 89 fL (ref 79–97)
Monocytes Absolute: 0.6 10*3/uL (ref 0.1–0.9)
Monocytes: 7 %
Neutrophils Absolute: 6.2 10*3/uL (ref 1.4–7.0)
Neutrophils: 70 %
Platelets: 308 10*3/uL (ref 150–450)
RBC: 4.59 x10E6/uL (ref 3.77–5.28)
RDW: 13 % (ref 11.7–15.4)
WBC: 8.8 10*3/uL (ref 3.4–10.8)

## 2022-07-19 LAB — CMP14+EGFR
ALT: 47 IU/L — ABNORMAL HIGH (ref 0–32)
AST: 35 IU/L (ref 0–40)
Albumin/Globulin Ratio: 2 (ref 1.2–2.2)
Albumin: 4.5 g/dL (ref 3.9–4.9)
Alkaline Phosphatase: 89 IU/L (ref 44–121)
BUN/Creatinine Ratio: 17 (ref 12–28)
BUN: 20 mg/dL (ref 8–27)
Bilirubin Total: 0.4 mg/dL (ref 0.0–1.2)
CO2: 25 mmol/L (ref 20–29)
Calcium: 10.3 mg/dL (ref 8.7–10.3)
Chloride: 96 mmol/L (ref 96–106)
Creatinine, Ser: 1.19 mg/dL — ABNORMAL HIGH (ref 0.57–1.00)
Globulin, Total: 2.3 g/dL (ref 1.5–4.5)
Glucose: 158 mg/dL — ABNORMAL HIGH (ref 70–99)
Potassium: 4.3 mmol/L (ref 3.5–5.2)
Sodium: 135 mmol/L (ref 134–144)
Total Protein: 6.8 g/dL (ref 6.0–8.5)
eGFR: 51 mL/min/{1.73_m2} — ABNORMAL LOW (ref >59)

## 2022-07-19 LAB — TSH: TSH: 4.81 u[IU]/mL — ABNORMAL HIGH (ref 0.450–4.500)

## 2022-07-25 ENCOUNTER — Ambulatory Visit: Payer: Medicare HMO | Admitting: Family

## 2022-07-25 ENCOUNTER — Other Ambulatory Visit: Payer: Self-pay | Admitting: Family

## 2022-07-25 DIAGNOSIS — I152 Hypertension secondary to endocrine disorders: Secondary | ICD-10-CM

## 2022-07-25 DIAGNOSIS — I10 Essential (primary) hypertension: Secondary | ICD-10-CM

## 2022-07-28 ENCOUNTER — Ambulatory Visit
Admission: RE | Admit: 2022-07-28 | Discharge: 2022-07-28 | Disposition: A | Discharge status: Home / Self Care | Payer: Medicare HMO | Source: Ambulatory Visit | Attending: Family | Admitting: Family

## 2022-07-28 DIAGNOSIS — N2889 Other specified disorders of kidney and ureter: Secondary | ICD-10-CM

## 2022-07-28 MED ORDER — GADOPICLENOL 0.5 MMOL/ML IV SOLN
10.0000 mL | Freq: Once | INTRAVENOUS | Status: AC | PRN
  Administered 2022-07-28: 10 mL via INTRAVENOUS

## 2022-08-01 ENCOUNTER — Other Ambulatory Visit: Payer: Self-pay

## 2022-08-01 ENCOUNTER — Ambulatory Visit (INDEPENDENT_AMBULATORY_CARE_PROVIDER_SITE_OTHER): Payer: Medicare HMO | Admitting: Physical Medicine and Rehabilitation

## 2022-08-01 VITALS — BP 108/70 | HR 89

## 2022-08-01 DIAGNOSIS — M5416 Radiculopathy, lumbar region: Secondary | ICD-10-CM | POA: Diagnosis not present

## 2022-08-01 MED ORDER — METHYLPREDNISOLONE ACETATE 80 MG/ML IJ SUSP
80.0000 mg | Freq: Once | INTRAMUSCULAR | Status: DC
Start: 2022-08-01 — End: 2022-08-02

## 2022-08-01 NOTE — Progress Notes (Signed)
Functional Pain Scale - descriptive words and definitions  Moderate (4)   Constantly aware of pain, can complete ADLs with modification/sleep marginally affected at times/passive distraction is of no use, but active distraction gives some relief. Moderate range order  Average Pain  varies   +Driver, -BT, -Dye Allergies.  Lower back pain on right side that is moving to the left and radiating all the way down the right leg to the ankle

## 2022-08-01 NOTE — Patient Instructions (Signed)

## 2022-08-02 ENCOUNTER — Encounter: Payer: Self-pay | Admitting: Family

## 2022-08-02 ENCOUNTER — Ambulatory Visit (INDEPENDENT_AMBULATORY_CARE_PROVIDER_SITE_OTHER): Payer: Medicare HMO | Admitting: Family

## 2022-08-02 VITALS — BP 113/69 | HR 86 | Temp 97.9°F | Ht 64.0 in | Wt 206.0 lb

## 2022-08-02 DIAGNOSIS — K760 Fatty (change of) liver, not elsewhere classified: Secondary | ICD-10-CM | POA: Diagnosis not present

## 2022-08-02 DIAGNOSIS — E1169 Type 2 diabetes mellitus with other specified complication: Secondary | ICD-10-CM | POA: Diagnosis not present

## 2022-08-02 DIAGNOSIS — K219 Gastro-esophageal reflux disease without esophagitis: Secondary | ICD-10-CM | POA: Diagnosis not present

## 2022-08-02 DIAGNOSIS — B353 Tinea pedis: Secondary | ICD-10-CM | POA: Diagnosis not present

## 2022-08-02 DIAGNOSIS — E1159 Type 2 diabetes mellitus with other circulatory complications: Secondary | ICD-10-CM | POA: Diagnosis not present

## 2022-08-02 DIAGNOSIS — G6289 Other specified polyneuropathies: Secondary | ICD-10-CM

## 2022-08-02 DIAGNOSIS — M159 Polyosteoarthritis, unspecified: Secondary | ICD-10-CM | POA: Diagnosis not present

## 2022-08-02 DIAGNOSIS — E119 Type 2 diabetes mellitus without complications: Secondary | ICD-10-CM

## 2022-08-02 DIAGNOSIS — Z6835 Body mass index (BMI) 35.0-35.9, adult: Secondary | ICD-10-CM

## 2022-08-02 DIAGNOSIS — I152 Hypertension secondary to endocrine disorders: Secondary | ICD-10-CM

## 2022-08-02 DIAGNOSIS — E785 Hyperlipidemia, unspecified: Secondary | ICD-10-CM

## 2022-08-02 DIAGNOSIS — I10 Essential (primary) hypertension: Secondary | ICD-10-CM

## 2022-08-02 MED ORDER — OMEPRAZOLE 20 MG PO CPDR: 20.0000 mg | DELAYED_RELEASE_CAPSULE | Freq: Every day | ORAL | 2 refills | Status: DC

## 2022-08-02 MED ORDER — GABAPENTIN 300 MG PO CAPS: 300.0000 mg | ORAL_CAPSULE | Freq: Every day | ORAL | 3 refills | Status: DC

## 2022-08-02 MED ORDER — MELOXICAM 15 MG PO TABS: 15.0000 mg | ORAL_TABLET | Freq: Every day | ORAL | 2 refills | Status: DC

## 2022-08-02 MED ORDER — HYDROCHLOROTHIAZIDE 25 MG PO TABS: 25.0000 mg | ORAL_TABLET | Freq: Every day | ORAL | 1 refills | Status: DC

## 2022-08-02 MED ORDER — TERBINAFINE HCL 250 MG PO TABS
250.0000 mg | ORAL_TABLET | Freq: Every day | ORAL | 0 refills | Status: DC
Start: 2022-08-02 — End: 2023-08-14

## 2022-08-02 MED ORDER — METFORMIN HCL 1000 MG PO TABS
1000.0000 mg | ORAL_TABLET | Freq: Two times a day (BID) | ORAL | 2 refills | Status: DC
Start: 2022-08-02 — End: 2023-04-20

## 2022-08-02 MED ORDER — ROSUVASTATIN CALCIUM 20 MG PO TABS: 20.0000 mg | ORAL_TABLET | Freq: Every day | ORAL | 3 refills | Status: DC

## 2022-08-02 NOTE — Progress Notes (Signed)
Subjective:    Patient ID: Alexandria Tucker, female    DOB: Aug 03, 1955, 67 y.o.   MRN: [id number]  Chief Complaint  Patient presents with   Medical Management of Chronic Issues   PT presents to the office today for chronic follow up. She is morbid obese with a BMI of 35 with DM and HTN.   She is followed by Endocrinologists for DM every 4 months. She was just started Ozempic, but getting it through patient assistance.    She is complaining of bilateral burning and tingling pain of feet of 5 out 10. Reports this greatly improved since starting gabapentin.  Hypertension This is a chronic problem. The current episode started more than 1 year ago. The problem has been resolved since onset. The problem is controlled. Pertinent negatives include no blurred vision, malaise/fatigue, peripheral edema or shortness of breath. Risk factors for coronary artery disease include dyslipidemia, diabetes mellitus, obesity and sedentary lifestyle. The current treatment provides moderate improvement.  Gastroesophageal Reflux She complains of belching and heartburn. This is a chronic problem. The current episode started more than 1 year ago. The problem occurs occasionally. Risk factors include obesity. She has tried a PPI for the symptoms. The treatment provided moderate relief.  Hyperlipidemia This is a chronic problem. The current episode started more than 1 year ago. The problem is controlled. Recent lipid tests were reviewed and are normal. Exacerbating diseases include obesity. Pertinent negatives include no shortness of breath. Current antihyperlipidemic treatment includes statins. The current treatment provides moderate improvement of lipids. Risk factors for coronary artery disease include dyslipidemia, hypertension, a sedentary lifestyle and diabetes mellitus.  Diabetes She presents for her follow-up diabetic visit. She has type 2 diabetes mellitus. Pertinent negatives for diabetes include no blurred  vision and no foot paresthesias. Symptoms are stable. Risk factors for coronary artery disease include dyslipidemia, diabetes mellitus, hypertension, sedentary lifestyle and post-menopausal. She is following a generally unhealthy diet. Her overall blood glucose range is 140-180 mg/dl.  Rash This is a recurrent problem. The current episode started more than 1 month ago. The problem has been waxing and waning since onset. The affected locations include the right foot. The rash is characterized by redness and itchiness. Pertinent negatives include no shortness of breath.     Review of Systems  Constitutional:  Negative for malaise/fatigue.  Eyes:  Negative for blurred vision.  Respiratory:  Negative for shortness of breath.   Gastrointestinal:  Positive for heartburn.  Skin:  Positive for rash.  All other systems reviewed and are negative.      Objective:   Physical Exam Vitals reviewed.  Constitutional:      General: She is not in acute distress.    Appearance: She is well-developed.  HENT:     Head: Normocephalic and atraumatic.     Right Ear: Tympanic membrane normal.     Left Ear: Tympanic membrane normal.  Eyes:     Pupils: Pupils are equal, round, and reactive to light.  Neck:     Thyroid: No thyromegaly.  Cardiovascular:     Rate and Rhythm: Normal rate and regular rhythm.     Heart sounds: Normal heart sounds. No murmur heard. Pulmonary:     Effort: Pulmonary effort is normal. No respiratory distress.     Breath sounds: Normal breath sounds. No wheezing.  Abdominal:     General: Bowel sounds are normal. There is no distension.     Palpations: Abdomen is soft.     Tenderness:  There is no abdominal tenderness.  Musculoskeletal:        General: No tenderness.     Cervical back: Normal range of motion and neck supple.  Skin:    General: Skin is warm and dry.     Comments: Right foot erythemas and rash  Neurological:     Mental Status: She is alert and oriented to  person, place, and time.     Cranial Nerves: No cranial nerve deficit.     Deep Tendon Reflexes: Reflexes are normal and symmetric.  Psychiatric:        Behavior: Behavior normal.        Thought Content: Thought content normal.        Judgment: Judgment normal.       BP 113/69   Pulse 86   Temp 97.9 F (36.6 C) (Temporal)   Ht 5\' 4"  (1.626 m)   Wt 206 lb (93.4 kg)   SpO2 96%   BMI 35.36 kg/m      Assessment & Plan:  Alexandria Tucker comes in today with chief complaint of Medical Management of Chronic Issues   Diagnosis and orders addressed:  1. Hyperlipidemia associated with type 2 diabetes mellitus  2. Hypertension associated with diabetes  3. Type 2 diabetes mellitus with other specified complication, without long-term current use of insulin  4. Morbid obesity  5. Gastroesophageal reflux disease, unspecified whether esophagitis present  6. Hepatic steatosis  7. Tinea pedis of right foot Start Lamisil  Avoid scratching  - terbinafine (LAMISIL) 250 MG tablet; Take 1 tablet (250 mg total) by mouth daily.  Dispense: 20 tablet; Refill: 0   Labs reviewed Health Maintenance reviewed Diet and exercise encouraged  Follow up plan: 6 months   Alexandria Dun, FNP

## 2022-08-02 NOTE — Patient Instructions (Signed)
Athlete's Foot Athlete's foot (tinea pedis) is a fungal infection of the skin on your feet. It often occurs on the skin that is between or underneath the toes. It can also occur on the soles of your feet. The infection can spread from person to person (is contagious). It can also spread when a person's bare feet come in contact with the fungus on shower floors or on items such as shoes. What are the causes? This condition is caused by a fungus that grows in warm, moist places. You can get athlete's foot by sharing shoes, shower stalls, towels, and wet floors with someone who is infected. Not washing your feet or changing your socks often enough can also lead to athlete's foot. What increases the risk? This condition is more likely to develop in: Men. People who have a weak body defense system (immune system). People who have diabetes. People who use public showers, such as at a gym. People who wear heavy-duty shoes, such as industrial or military shoes. Seasons with warm, humid weather. What are the signs or symptoms? Symptoms of this condition include: Itchy areas between your toes or on the soles of your feet. White, flaky, or scaly areas between your toes or on the soles of your feet. Very itchy small blisters between your toes or on the soles of your feet. Small cuts in your skin. These cuts can become infected. Thick or discolored toenails. How is this diagnosed? This condition may be diagnosed with a physical exam and a review of your medical history. Your health care provider may also take a skin or toenail sample to examine under a microscope. How is this treated? This condition is treated with antifungal medicines. These may be applied as powders, ointments, or creams. In severe cases, an oral antifungal medicine may be given. Follow these instructions at home: Medicines Apply or take over-the-counter and prescription medicines only as told by your health care provider. Apply your  antifungal medicine as told by your health care provider. Do not stop using the antifungal even if your condition improves. Foot care Do not scratch your feet. Keep your feet dry: Wear cotton or wool socks. Change your socks every day or if they become wet. Wear shoes that allow air to flow, such as sandals or canvas tennis shoes. Wash and dry your feet, including the area between your toes. Also, wash and dry your feet: Every day or as told by your health care provider. After exercising. General instructions Do not let others use towels, shoes, nail clippers, or other personal items that touch your feet. Protect your feet by wearing sandals in wet areas, such as locker rooms and shared showers. Keep all follow-up visits. This is important. If you have diabetes, keep your blood sugar under control. Contact a health care provider if: You have a fever. You have swelling, soreness, warmth, or redness in your foot. Your feet are not getting better with treatment. Your symptoms get worse. You have new symptoms. You have severe pain. Summary Athlete's foot (tinea pedis) is a fungal infection of the skin on your feet. It often occurs on skin that is between or underneath the toes. This condition is caused by a fungus that grows in warm, moist places. Symptoms include white, flaky, or scaly areas between your toes or on the soles of your feet. This condition is treated with antifungal medicines. Keep your feet clean. Always dry them thoroughly. This information is not intended to replace advice given to you by   your health care provider. Make sure you discuss any questions you have with your health care provider. Document Revised: 08/09/2020 Document Reviewed: 08/09/2020 Elsevier Patient Education  2023 [employer].  

## 2022-08-02 NOTE — Addendum Note (Signed)
Addended by: Evelina Dun A on: 08/02/2022 03:49 PM   Modules accepted: Orders

## 2022-08-06 NOTE — Procedures (Signed)
Lumbar Epidural Steroid Injection - Interlaminar Approach with Fluoroscopic Guidance  Patient: Alexandria Tucker      Date of Birth: Mar 03, 1956 MRN: [id number] PCP: Junie Spencer, FNP      Visit Date: 08/01/2022   Universal Protocol:     Consent Given By: the patient  Position: PRONE  Additional Comments: Vital signs were monitored before and after the procedure. Patient was prepped and draped in the usual sterile fashion. The correct patient, procedure, and site was verified.   Injection Procedure Details:   Procedure diagnoses: Lumbar radiculopathy [M54.16]   Meds Administered:  Meds ordered this encounter  Medications   DISCONTD: methylPREDNISolone acetate (DEPO-MEDROL) injection 80 mg     Laterality: Right  Location/Site:  L4-5 -using the numbering scheme on the most current MRI she has an L5 transitional segment.  Fluoroscopic imaging will look like the injection is placed at L3-4.  Needle: 3.5 in., 20 ga. Tuohy  Needle Placement: Paramedian epidural  Findings:   -Comments: Excellent flow of contrast into the epidural space.  Procedure Details: Using a paramedian approach from the side mentioned above, the region overlying the inferior lamina was localized under fluoroscopic visualization and the soft tissues overlying this structure were infiltrated with 4 ml. of 1% Lidocaine without Epinephrine. The Tuohy needle was inserted into the epidural space using a paramedian approach.   The epidural space was localized using loss of resistance along with counter oblique bi-planar fluoroscopic views.  After negative aspirate for air, blood, and CSF, a 2 ml. volume of Isovue-250 was injected into the epidural space and the flow of contrast was observed. Radiographs were obtained for documentation purposes.    The injectate was administered into the level noted above.   Additional Comments:  No complications occurred Dressing: 2 x 2 sterile gauze and Band-Aid     Post-procedure details: Patient was observed during the procedure. Post-procedure instructions were reviewed.  Patient left the clinic in stable condition.

## 2022-08-06 NOTE — Progress Notes (Signed)
Alexandria Tucker - 67 y.o. female MRN [id number]  Date of birth: 1955/11/13  Office Visit Note: Visit Date: 08/01/2022 PCP: Junie Spencer, FNP Referred by: Junie Spencer, FNP  Subjective: Chief Complaint  Patient presents with   Lower Back - Pain   HPI:  Alexandria Tucker is a 67 y.o. female who comes in today at the request of Ellin Goodie, FNP for planned Right L4-5 Lumbar Interlaminar epidural steroid injection with fluoroscopic guidance.  She has a transitional L5 segment.  The patient has failed conservative care including home exercise, medications, time and activity modification.  This injection will be diagnostic and hopefully therapeutic.  Please see requesting physician notes for further details and justification.   ROS Otherwise per HPI.  Assessment & Plan: Visit Diagnoses:    ICD-10-CM   1. Lumbar radiculopathy  M54.16 XR C-ARM NO REPORT    Epidural Steroid injection    DISCONTINUED: methylPREDNISolone acetate (DEPO-MEDROL) injection 80 mg      Plan: No additional findings.   Meds & Orders:  Meds ordered this encounter  Medications   DISCONTD: methylPREDNISolone acetate (DEPO-MEDROL) injection 80 mg    Orders Placed This Encounter  Procedures   XR C-ARM NO REPORT   Epidural Steroid injection    Follow-up: Return for visit to requesting provider as needed.   Procedures: No procedures performed  Lumbar Epidural Steroid Injection - Interlaminar Approach with Fluoroscopic Guidance  Patient: Alexandria Tucker      Date of Birth: [birth year] MRN: [id number] PCP: Junie Spencer, FNP      Visit Date: 08/01/2022   Universal Protocol:     Consent Given By: the patient  Position: PRONE  Additional Comments: Vital signs were monitored before and after the procedure. Patient was prepped and draped in the usual sterile fashion. The correct patient, procedure, and site was verified.   Injection Procedure Details:   Procedure diagnoses: Lumbar  radiculopathy [M54.16]   Meds Administered:  Meds ordered this encounter  Medications   DISCONTD: methylPREDNISolone acetate (DEPO-MEDROL) injection 80 mg     Laterality: Right  Location/Site:  L4-5 -using the numbering scheme on the most current MRI she has an L5 transitional segment.  Fluoroscopic imaging will look like the injection is placed at L3-4.  Needle: 3.5 in., 20 ga. Tuohy  Needle Placement: Paramedian epidural  Findings:   -Comments: Excellent flow of contrast into the epidural space.  Procedure Details: Using a paramedian approach from the side mentioned above, the region overlying the inferior lamina was localized under fluoroscopic visualization and the soft tissues overlying this structure were infiltrated with 4 ml. of 1% Lidocaine without Epinephrine. The Tuohy needle was inserted into the epidural space using a paramedian approach.   The epidural space was localized using loss of resistance along with counter oblique bi-planar fluoroscopic views.  After negative aspirate for air, blood, and CSF, a 2 ml. volume of Isovue-250 was injected into the epidural space and the flow of contrast was observed. Radiographs were obtained for documentation purposes.    The injectate was administered into the level noted above.   Additional Comments:  No complications occurred Dressing: 2 x 2 sterile gauze and Band-Aid    Post-procedure details: Patient was observed during the procedure. Post-procedure instructions were reviewed.  Patient left the clinic in stable condition.   Clinical History: EXAM: MRI LUMBAR SPINE WITHOUT CONTRAST   TECHNIQUE: Multiplanar, multisequence MR imaging of the lumbar spine was performed. No intravenous contrast was administered.  COMPARISON:  Lumbar spine radiographs 06/29/2022 and abdominopelvic CT 11/21/2016. Report only from lumbar MRI 07/05/2013.   FINDINGS: Segmentation: Transitional lumbosacral anatomy. In keeping with  the numbering applied to the previous lumbar MRI, the transitional segment is assigned L5 and there are vestigial ribs at T12.   Alignment: Mild convex left scoliosis. There is 3 mm of degenerative anterolisthesis at L4-5.   Vertebrae: No worrisome osseous lesion, acute fracture or pars defect. There are mild sacroiliac degenerative changes bilaterally.   Conus medullaris: Extends to the L1 level and appears normal.   Paraspinal and other soft tissues: No significant paraspinal findings. Small renal sinus cysts bilaterally, similar to previous CT; no follow-up imaging recommended. Small dependent gallstones.   Disc levels:   T12-L1: Chronic spondylosis with loss of disc height and a calcified central disc protrusion. This appears unchanged from previous CT. There is chronic mild mass effect on the thecal sac, but no significant exiting T12 nerve root encroachment.   L1-2: Disc height and hydration are maintained. Minimal disc bulging. No spinal stenosis or nerve root encroachment.   L2-3: Preserved disc height with mild disc bulging and facet hypertrophy. No significant spinal stenosis or nerve root encroachment.   L3-4: Mild loss of disc height with disc bulging, facet and ligamentous hypertrophy. Resulting mild spinal stenosis with mild asymmetric right lateral recess and right foraminal narrowing, similar to previous abdominal CT.   L4-5: Mild loss of disc height with annular disc bulging and moderate to advanced bilateral facet hypertrophy. Resulting mild multifactorial spinal stenosis with mild lateral recess and foraminal narrowing bilaterally.   L5-S1: Disc height and hydration are maintained. Mild bilateral facet hypertrophy. No spinal stenosis or nerve root encroachment.   IMPRESSION: 1. Transitional lumbosacral anatomy. In keeping with the numbering applied to the previous lumbar MRI, the transitional segment is assigned L5 and there are vestigial ribs at  T12. 2. No acute findings or clear explanation for the patient's symptoms. 3. Mild multifactorial spinal stenosis at L3-4 and L4-5. At L3-4, there is mild asymmetric right lateral recess and right foraminal narrowing. At L4-5, there is mild lateral recess and foraminal narrowing bilaterally. 4. Chronic calcified central disc protrusion at T12-L1 without significant spinal stenosis or nerve root encroachment. 5. Cholelithiasis.     Electronically Signed   By: Carey Bullocks M.D.   On: 07/11/2022 17:03     Objective:  VS:  HT:    WT:   BMI:     BP:108/70  HR:89bpm  TEMP: ( )  RESP:  Physical Exam   Imaging: No results found.

## 2022-08-29 DIAGNOSIS — R310 Gross hematuria: Secondary | ICD-10-CM | POA: Diagnosis not present

## 2022-08-29 DIAGNOSIS — N200 Calculus of kidney: Secondary | ICD-10-CM | POA: Diagnosis not present

## 2022-08-30 ENCOUNTER — Encounter: Payer: Self-pay | Admitting: Family

## 2022-08-30 ENCOUNTER — Telehealth: Payer: Self-pay | Admitting: Family

## 2022-08-30 ENCOUNTER — Ambulatory Visit (INDEPENDENT_AMBULATORY_CARE_PROVIDER_SITE_OTHER): Payer: Medicare HMO | Admitting: Family

## 2022-08-30 VITALS — BP 112/69 | HR 98 | Temp 97.8°F | Ht 64.0 in | Wt 201.0 lb

## 2022-08-30 DIAGNOSIS — R319 Hematuria, unspecified: Secondary | ICD-10-CM

## 2022-08-30 DIAGNOSIS — N12 Tubulo-interstitial nephritis, not specified as acute or chronic: Secondary | ICD-10-CM | POA: Diagnosis not present

## 2022-08-30 DIAGNOSIS — R399 Unspecified symptoms and signs involving the genitourinary system: Secondary | ICD-10-CM

## 2022-08-30 DIAGNOSIS — R109 Unspecified abdominal pain: Secondary | ICD-10-CM | POA: Diagnosis not present

## 2022-08-30 LAB — URINALYSIS, COMPLETE
Bilirubin, UA: NEGATIVE
Glucose, UA: NEGATIVE
Leukocytes,UA: NEGATIVE
Nitrite, UA: NEGATIVE
Specific Gravity, UA: >1.03 — ABNORMAL HIGH (ref 1.005–1.030)
Urobilinogen, Ur: 1 mg/dL (ref 0.2–1.0)
pH, UA: 6.5 (ref 5.0–7.5)

## 2022-08-30 LAB — MICROSCOPIC EXAMINATION
Epithelial Cells (non renal): NONE SEEN /hpf (ref 0–10)
RBC, Urine: >30 /hpf — AB (ref 0–2)
Renal Epithel, UA: NONE SEEN /hpf

## 2022-08-30 MED ORDER — CEFTRIAXONE SODIUM 1 G IJ SOLR
1.0000 g | Freq: Once | INTRAMUSCULAR | Status: AC
Start: 2022-08-30 — End: 2022-08-30
  Administered 2022-08-30: 1 g via INTRAMUSCULAR

## 2022-08-30 MED ORDER — CIPROFLOXACIN HCL 500 MG PO TABS: 500.0000 mg | ORAL_TABLET | Freq: Two times a day (BID) | ORAL | 0 refills | Status: DC

## 2022-08-30 MED ORDER — CIPROFLOXACIN HCL 500 MG PO TABS
500.0000 mg | ORAL_TABLET | Freq: Two times a day (BID) | ORAL | 0 refills | Status: DC
Start: 2022-08-30 — End: 2022-11-21

## 2022-08-30 NOTE — Patient Instructions (Signed)
Pyelonephritis, Adult Pyelonephritis is an infection that occurs in the kidney. The kidneys are the organs that filter the blood and move waste from the bloodstream to the urine. Urine passes out of the kidneys through tubes called ureters and goes into the bladder. There are two main types of this condition: Acute pyelonephritis. These are infections that come on quickly and without any warning. Chronic pyelonephritis. These infections last for a long time. In most cases, the infection clears up with treatment. In more severe cases, an infection can spread to the bloodstream or lead to other problems with the kidneys. What are the causes? This condition is often caused by bacteria. The bacteria may travel: From the bladder up to the kidney. This may happen after you have a bladder infection (cystitis) or urinary tract infection (UTI). From the bloodstream to the kidney. What increases the risk? You are more likely to develop this condition if: You are female. Your risk is even higher if you are pregnant. You are older. You have: Diabetes. Prostatitis. This is inflammation of the prostate gland. Kidney stones or bladder stones. Problems with your kidneys or ureters. Cancer. Spinal cord injury or nerve damage around the bladder. You have a soft tube (catheter) placed in your bladder. You are sexually active and use spermicides. You have or have had a UTI. What are the signs or symptoms? Symptoms of this condition include: An urge to urinate that is strong or does not go away. You may also urinate more often than normal. A burning or stinging feeling when you urinate. Pain. This may be in your abdomen, back, side, or groin. Fever or chills. Nausea or vomiting. Urine that is bloody, dark, cloudy, or smells bad. How is this diagnosed? This condition may be diagnosed based on your medical history and a physical exam. You may also have tests, such as: Urine tests. Blood tests. Imaging  tests of the kidneys. These may include an ultrasound or CT scan. How is this treated? Treatment for this condition depends on the severity of the infection. If the infection is mild and found early, you may be given antibiotics to take by mouth. You will need to drink lots of fluids. If the infection is more severe, you may need to stay in the hospital and receive antibiotics through an IV. You may also get fluids through an IV. After you leave the hospital, you may need to take antibiotics by mouth. Other treatments may be needed. These will depend on the cause of the infection. Follow these instructions at home: Eating and drinking Drink enough fluid to keep your urine pale yellow. Avoid caffeine, tea, and carbonated drinks. These can irritate the bladder. General instructions Take over-the-counter and prescription medicines as told by your health care provider. Finish your antibiotics even if you start to feel better. Urinate often. Avoid holding in urine for long periods of time. Urinate before and after sex. If you are female, cleanse from front to back after a bowel movement. Use each tissue only once. Keep all follow-up visits. Your health care provider will want to make sure your infection is gone. Contact a health care provider if: Your symptoms do not get better after 2 days of treatment. Your symptoms get worse. You have a fever or chills. You cannot take your antibiotics. Get help right away if: You vomit each time that you eat or drink. You have severe pain in your back or side. You are very weak, or you faint. This information is   not intended to replace advice given to you by your health care provider. Make sure you discuss any questions you have with your health care provider. Document Revised: 11/08/2021 Document Reviewed: 11/08/2021 Elsevier Patient Education  2023 [employer]  

## 2022-08-30 NOTE — Progress Notes (Signed)
Subjective:    Patient ID: Alexandria Tucker, female    DOB: 20-Aug-1955, 67 y.o.   MRN: [id number]  Chief Complaint  Patient presents with   Hematuria   PT presents to the office today with recurrent hematuria. She saw a Urologists for hematuria yesterday and was told she did not have a UTI and had a negative CT scan. She continues to have flank pain that comes and goes. She saw an Urgent Care in [facility] last week with flank pain and was given Keflex 500 mg BID.  Hematuria This is a new problem. The current episode started in the past 7 days. The problem is unchanged. Her pain is at a severity of 3/10. The pain is mild. Irritative symptoms include urgency. Irritative symptoms do not include nocturia. Associated symptoms include flank pain and hesitancy. Pertinent negatives include no fever or nausea.      Review of Systems  Constitutional:  Negative for fever.  Gastrointestinal:  Negative for nausea.  Genitourinary:  Positive for flank pain, hematuria, hesitancy and urgency. Negative for nocturia.  All other systems reviewed and are negative.      Objective:   Physical Exam Vitals reviewed.  Constitutional:      General: She is not in acute distress.    Appearance: She is well-developed.  HENT:     Head: Normocephalic and atraumatic.  Eyes:     Pupils: Pupils are equal, round, and reactive to light.  Neck:     Thyroid: No thyromegaly.  Cardiovascular:     Rate and Rhythm: Normal rate and regular rhythm.     Heart sounds: Normal heart sounds. No murmur heard. Pulmonary:     Effort: Pulmonary effort is normal. No respiratory distress.     Breath sounds: Normal breath sounds. No wheezing.  Abdominal:     General: Bowel sounds are normal. There is no distension.     Palpations: Abdomen is soft.     Tenderness: There is no abdominal tenderness. There is left CVA tenderness.  Musculoskeletal:        General: No tenderness. Normal range of motion.     Cervical back: Normal range  of motion and neck supple.  Skin:    General: Skin is warm and dry.  Neurological:     Mental Status: She is alert and oriented to person, place, and time.     Cranial Nerves: No cranial nerve deficit.     Deep Tendon Reflexes: Reflexes are normal and symmetric.  Psychiatric:        Behavior: Behavior normal.        Thought Content: Thought content normal.        Judgment: Judgment normal.       BP 112/69   Pulse 98   Temp 97.8 F (36.6 C) (Temporal)   Ht 5\' 4"  (1.626 m)   Wt 201 lb (91.2 kg)   SpO2 100%   BMI 34.50 kg/m      Assessment & Plan:  Gerald A Mcarthy comes in today with chief complaint of Hematuria   Diagnosis and orders addressed:  1. Hematuria, unspecified type - Urinalysis, Complete - Urine Culture - ciprofloxacin (CIPRO) 500 MG tablet; Take 1 tablet (500 mg total) by mouth 2 (two) times daily.  Dispense: 14 tablet; Refill: 0 - cefTRIAXone (ROCEPHIN) injection 1 g  2. Flank pain - cefTRIAXone (ROCEPHIN) injection 1 g  3. UTI symptoms - ciprofloxacin (CIPRO) 500 MG tablet; Take 1 tablet (500 mg total) by mouth  2 (two) times daily.  Dispense: 14 tablet; Refill: 0  4. Pyelonephritis   Start cipro  Start Rocephin  Force fluids Keep Urologists follow up    Jannifer Rodney, FNP

## 2022-08-30 NOTE — Telephone Encounter (Signed)
CALLED PATIENT WE GOT IT CHANGED AND SENT TO DIFFERENT PHARMACY PATIENT AWARE AND VERBALIZED UNDERSTANDING.

## 2022-08-30 NOTE — Telephone Encounter (Signed)
Pt said she was told by the pharmacy that it will take them 1-2 days before they can get her medication filled. Wanted to make PCP aware to advise on this.

## 2022-09-01 LAB — URINE CULTURE

## 2022-09-05 DIAGNOSIS — R310 Gross hematuria: Secondary | ICD-10-CM | POA: Diagnosis not present

## 2022-09-05 DIAGNOSIS — R1084 Generalized abdominal pain: Secondary | ICD-10-CM | POA: Diagnosis not present

## 2022-09-05 DIAGNOSIS — R3915 Urgency of urination: Secondary | ICD-10-CM | POA: Diagnosis not present

## 2022-09-29 ENCOUNTER — Other Ambulatory Visit: Payer: Self-pay | Admitting: Family

## 2022-09-29 DIAGNOSIS — I10 Essential (primary) hypertension: Secondary | ICD-10-CM

## 2022-10-12 DIAGNOSIS — Z1211 Encounter for screening for malignant neoplasm of colon: Secondary | ICD-10-CM

## 2022-10-17 DIAGNOSIS — E1169 Type 2 diabetes mellitus with other specified complication: Secondary | ICD-10-CM | POA: Diagnosis not present

## 2022-10-17 DIAGNOSIS — E1129 Type 2 diabetes mellitus with other diabetic kidney complication: Secondary | ICD-10-CM | POA: Diagnosis not present

## 2022-10-17 DIAGNOSIS — R809 Proteinuria, unspecified: Secondary | ICD-10-CM | POA: Diagnosis not present

## 2022-10-17 DIAGNOSIS — E1140 Type 2 diabetes mellitus with diabetic neuropathy, unspecified: Secondary | ICD-10-CM | POA: Diagnosis not present

## 2022-10-17 DIAGNOSIS — E1165 Type 2 diabetes mellitus with hyperglycemia: Secondary | ICD-10-CM | POA: Diagnosis not present

## 2022-10-17 DIAGNOSIS — E785 Hyperlipidemia, unspecified: Secondary | ICD-10-CM | POA: Diagnosis not present

## 2022-10-17 DIAGNOSIS — E669 Obesity, unspecified: Secondary | ICD-10-CM | POA: Diagnosis not present

## 2022-11-10 DIAGNOSIS — B353 Tinea pedis: Secondary | ICD-10-CM | POA: Diagnosis not present

## 2022-11-12 ENCOUNTER — Other Ambulatory Visit: Payer: Self-pay | Admitting: Family

## 2022-11-12 DIAGNOSIS — I152 Hypertension secondary to endocrine disorders: Secondary | ICD-10-CM

## 2022-11-12 DIAGNOSIS — I10 Essential (primary) hypertension: Secondary | ICD-10-CM

## 2022-11-18 ENCOUNTER — Encounter (HOSPITAL_BASED_OUTPATIENT_CLINIC_OR_DEPARTMENT_OTHER): Payer: Self-pay | Admitting: Emergency Medicine

## 2022-11-18 ENCOUNTER — Other Ambulatory Visit: Payer: Self-pay

## 2022-11-18 ENCOUNTER — Emergency Department (HOSPITAL_BASED_OUTPATIENT_CLINIC_OR_DEPARTMENT_OTHER): Payer: Medicare HMO

## 2022-11-18 ENCOUNTER — Ambulatory Visit (INDEPENDENT_AMBULATORY_CARE_PROVIDER_SITE_OTHER): Payer: Medicare HMO | Admitting: Family Medicine

## 2022-11-18 ENCOUNTER — Emergency Department (HOSPITAL_BASED_OUTPATIENT_CLINIC_OR_DEPARTMENT_OTHER)
Admission: EM | Admit: 2022-11-18 | Discharge: 2022-11-18 | Disposition: A | Discharge status: Home / Self Care | Payer: Medicare HMO | Attending: Emergency Medicine | Admitting: Emergency Medicine

## 2022-11-18 ENCOUNTER — Encounter: Payer: Self-pay | Admitting: Family Medicine

## 2022-11-18 VITALS — BP 83/51 | HR 89 | Temp 97.8°F | Ht 64.0 in | Wt 191.8 lb

## 2022-11-18 DIAGNOSIS — Z79899 Other long term (current) drug therapy: Secondary | ICD-10-CM | POA: Diagnosis not present

## 2022-11-18 DIAGNOSIS — I959 Hypotension, unspecified: Secondary | ICD-10-CM | POA: Insufficient documentation

## 2022-11-18 DIAGNOSIS — R1310 Dysphagia, unspecified: Secondary | ICD-10-CM | POA: Diagnosis not present

## 2022-11-18 DIAGNOSIS — E861 Hypovolemia: Secondary | ICD-10-CM | POA: Insufficient documentation

## 2022-11-18 DIAGNOSIS — E860 Dehydration: Secondary | ICD-10-CM | POA: Diagnosis not present

## 2022-11-18 DIAGNOSIS — R112 Nausea with vomiting, unspecified: Secondary | ICD-10-CM | POA: Diagnosis not present

## 2022-11-18 DIAGNOSIS — Z7982 Long term (current) use of aspirin: Secondary | ICD-10-CM | POA: Diagnosis not present

## 2022-11-18 LAB — URINALYSIS, ROUTINE W REFLEX MICROSCOPIC
Bacteria, UA: NONE SEEN
Bilirubin Urine: NEGATIVE
Glucose, UA: NEGATIVE mg/dL
Hgb urine dipstick: NEGATIVE
Ketones, ur: NEGATIVE mg/dL
Leukocytes,Ua: NEGATIVE
Nitrite: NEGATIVE
Protein, ur: 100 mg/dL — AB
Specific Gravity, Urine: 1.021 (ref 1.005–1.030)
pH: 6.5 (ref 5.0–8.0)

## 2022-11-18 LAB — CBC WITH DIFFERENTIAL/PLATELET
Abs Immature Granulocytes: 0.06 10*3/uL (ref 0.00–0.07)
Basophils Absolute: 0.1 10*3/uL (ref 0.0–0.1)
Basophils Relative: 0 %
Eosinophils Absolute: 0.2 10*3/uL (ref 0.0–0.5)
Eosinophils Relative: 2 %
HCT: 38.8 % (ref 36.0–46.0)
Hemoglobin: 13.4 g/dL (ref 12.0–15.0)
Immature Granulocytes: 1 %
Lymphocytes Relative: 16 %
Lymphs Abs: 1.9 10*3/uL (ref 0.7–4.0)
MCH: 30.2 pg (ref 26.0–34.0)
MCHC: 34.5 g/dL (ref 30.0–36.0)
MCV: 87.6 fL (ref 80.0–100.0)
Monocytes Absolute: 0.7 10*3/uL (ref 0.1–1.0)
Monocytes Relative: 6 %
Neutro Abs: 8.8 10*3/uL — ABNORMAL HIGH (ref 1.7–7.7)
Neutrophils Relative %: 75 %
Platelets: 446 10*3/uL — ABNORMAL HIGH (ref 150–400)
RBC: 4.43 MIL/uL (ref 3.87–5.11)
RDW: 14.3 % (ref 11.5–15.5)
WBC: 11.8 10*3/uL — ABNORMAL HIGH (ref 4.0–10.5)
nRBC: 0 % (ref 0.0–0.2)

## 2022-11-18 LAB — TROPONIN I (HIGH SENSITIVITY)
Troponin I (High Sensitivity): 2 ng/L (ref <18)
Troponin I (High Sensitivity): <2 ng/L (ref <18)

## 2022-11-18 LAB — HEPATIC FUNCTION PANEL
ALT: 34 U/L (ref 0–44)
AST: 37 U/L (ref 15–41)
Albumin: 4.9 g/dL (ref 3.5–5.0)
Alkaline Phosphatase: 52 U/L (ref 38–126)
Bilirubin, Direct: 0.1 mg/dL (ref 0.0–0.2)
Indirect Bilirubin: 0.6 mg/dL (ref 0.3–0.9)
Total Bilirubin: 0.7 mg/dL (ref 0.3–1.2)
Total Protein: 7.6 g/dL (ref 6.5–8.1)

## 2022-11-18 LAB — BASIC METABOLIC PANEL
Anion gap: 13 (ref 5–15)
BUN: 23 mg/dL (ref 8–23)
CO2: 26 mmol/L (ref 22–32)
Calcium: 10.1 mg/dL (ref 8.9–10.3)
Chloride: 97 mmol/L — ABNORMAL LOW (ref 98–111)
Creatinine, Ser: 1.27 mg/dL — ABNORMAL HIGH (ref 0.44–1.00)
GFR, Estimated: 47 mL/min — ABNORMAL LOW (ref >60)
Glucose, Bld: 159 mg/dL — ABNORMAL HIGH (ref 70–99)
Potassium: 3.2 mmol/L — ABNORMAL LOW (ref 3.5–5.1)
Sodium: 136 mmol/L (ref 135–145)

## 2022-11-18 LAB — LIPASE, BLOOD: Lipase: 44 U/L (ref 11–51)

## 2022-11-18 MED ORDER — SODIUM CHLORIDE 0.9 % IV BOLUS
1000.0000 mL | Freq: Once | INTRAVENOUS | Status: AC
  Administered 2022-11-18: 1000 mL via INTRAVENOUS

## 2022-11-18 MED ORDER — SODIUM CHLORIDE 0.9 % IV SOLN: INTRAVENOUS | Status: DC

## 2022-11-18 NOTE — ED Triage Notes (Addendum)
PT presents to ED POV. Pt sent here from PCP for hypotension (85/53). Pt reports that she had not been able to eat and drink well this week d/t not being able to swallow. Denies dizziness/weakness. Pt's bp labile in triage. 82/60, 130/92, 100/68, 88/70

## 2022-11-18 NOTE — Discharge Instructions (Signed)
Hold blood pressure medicine.  Workup here negative no acute findings.  Make an appointment follow back up with your regular doctor for blood pressure check.  Return for any new or worse symptoms.

## 2022-11-18 NOTE — ED Provider Notes (Signed)
[place] EMERGENCY DEPARTMENT AT [facility] Provider Note   CSN: [id number] Arrival date & time: 11/18/22  1519     History  Chief Complaint  Patient presents with   Hypotension    Alexandria Tucker is a 67 y.o. female.  Patient sent in from [facility] primary care office for hypotension.  Patient felt fine when she went there this was an incidental finding.  Patient's main complaint is that she has had some difficulty eating and drinking basically when she eats something about an hour or 2 later she will vomit it back up but it seems like it goes down there is no immediate response of anything getting stuck.  Patient has follow-up with gastroenterology for that.  Patient's blood pressures here were 82 then 130 and then today at 88.  Patient denies any symptoms no headache no respiratory symptoms no chest pain no shortness of breath no abdominal pain.  No dysuria.  Patient ambulated here and feels fine.  Heart rate was around 112 temp 97.7 blood pressure was 88/70.  Past medical history significant for hypertension upper lipidemia diabetes history of kidney stones.  Patient had an abdominal hysterectomy.  Has  never used tobacco products.       Home Medications Prior to Admission medications   Medication Sig Start Date End Date Taking? Authorizing Provider  aspirin EC 81 MG tablet Take 81 mg by mouth daily.    [provider]  blood glucose meter kit and supplies Dispense based on patient and insurance preference. Use up to four times daily as directed. (FOR ICD-10 E10.9, E11.9). 07/17/20   Junie Spencer, FNP  ciprofloxacin (CIPRO) 500 MG tablet Take 1 tablet (500 mg total) by mouth 2 (two) times daily. 08/30/22   Junie Spencer, FNP  fexofenadine (ALLEGRA) 180 MG tablet Take 1 tablet (180 mg total) by mouth daily. 01/27/16   Junie Spencer, FNP  fluticasone Aleda Grana) 50 MCG/ACT nasal spray One to 2 sprays each nostril at bedtime 01/27/16   Jannifer Rodney  A, FNP  gabapentin (NEURONTIN) 300 MG capsule Take 1 capsule (300 mg total) by mouth at bedtime. 08/02/22   Junie Spencer, FNP  hydrochlorothiazide (HYDRODIURIL) 25 MG tablet Take 1 tablet by mouth once daily 11/14/22   Jannifer Rodney A, FNP  meloxicam (MOBIC) 15 MG tablet Take 1 tablet (15 mg total) by mouth daily. 08/02/22   Junie Spencer, FNP  metFORMIN (GLUCOPHAGE) 1000 MG tablet Take 1 tablet (1,000 mg total) by mouth 2 (two) times daily with a meal. 08/02/22   Hawks, Neysa Bonito A, FNP  omeprazole (PRILOSEC) 20 MG capsule Take 1 capsule (20 mg total) by mouth daily. 08/02/22   Hawks, Christy A, FNP  OZEMPIC, 0.25 OR 0.5 MG/DOSE, 2 MG/3ML SOPN Inject into the skin.    [provider]  ramipril (ALTACE) 10 MG capsule Take 1 capsule by mouth once daily 09/29/22   Jannifer Rodney A, FNP  rosuvastatin (CRESTOR) 20 MG tablet Take 1 tablet (20 mg total) by mouth daily. 08/02/22   Jannifer Rodney A, FNP  terbinafine (LAMISIL) 250 MG tablet Take 1 tablet (250 mg total) by mouth daily. 08/02/22   Junie Spencer, FNP      Allergies    Other, Atorvastatin, Augmentin [amoxicillin-pot clavulanate], Bactrim [sulfamethoxazole-trimethoprim], Lipitor [atorvastatin calcium], and Niaspan [niacin er]    Review of Systems   Review of Systems  Constitutional:  Negative for chills and fever.  HENT:  Negative for ear pain  and sore throat.   Eyes:  Negative for pain and visual disturbance.  Respiratory:  Negative for cough and shortness of breath.   Cardiovascular:  Negative for chest pain and palpitations.  Gastrointestinal:  Negative for abdominal pain and vomiting.  Genitourinary:  Negative for dysuria and hematuria.  Musculoskeletal:  Negative for arthralgias and back pain.  Skin:  Negative for color change and rash.  Neurological:  Negative for seizures and syncope.  All other systems reviewed and are negative.   Physical Exam Updated Vital Signs BP 113/67   Pulse 80   Temp 97.7 F (36.5 C) (Oral)    Resp 15   SpO2 100%  Physical Exam Vitals and nursing note reviewed.  Constitutional:      General: She is not in acute distress.    Appearance: She is well-developed.  HENT:     Head: Normocephalic and atraumatic.     Mouth/Throat:     Mouth: Mucous membranes are dry.  Eyes:     Conjunctiva/sclera: Conjunctivae normal.  Cardiovascular:     Rate and Rhythm: Normal rate and regular rhythm.     Heart sounds: No murmur heard. Pulmonary:     Effort: Pulmonary effort is normal. No respiratory distress.     Breath sounds: Normal breath sounds.  Abdominal:     Palpations: Abdomen is soft.     Tenderness: There is no abdominal tenderness.  Musculoskeletal:        General: No swelling.     Cervical back: Neck supple.  Skin:    General: Skin is warm and dry.     Capillary Refill: Capillary refill takes less than 2 seconds.  Neurological:     General: No focal deficit present.     Mental Status: She is alert and oriented to person, place, and time.     Cranial Nerves: No cranial nerve deficit.     Sensory: No sensory deficit.     Motor: No weakness.  Psychiatric:        Mood and Affect: Mood normal.     ED Results / Procedures / Treatments   Labs (all labs ordered are listed, but only abnormal results are displayed) Labs Reviewed  CBC WITH DIFFERENTIAL/PLATELET - Abnormal; Notable for the following components:      Result Value   WBC 11.8 (*)    Platelets 446 (*)    Neutro Abs 8.8 (*)    All other components within normal limits  BASIC METABOLIC PANEL - Abnormal; Notable for the following components:   Potassium 3.2 (*)    Chloride 97 (*)    Glucose, Bld 159 (*)    Creatinine, Ser 1.27 (*)    GFR, Estimated 47 (*)    All other components within normal limits  URINALYSIS, ROUTINE W REFLEX MICROSCOPIC - Abnormal; Notable for the following components:   Protein, ur 100 (*)    All other components within normal limits  HEPATIC FUNCTION PANEL  LIPASE, BLOOD  TROPONIN I  (HIGH SENSITIVITY)  TROPONIN I (HIGH SENSITIVITY)    EKG EKG Interpretation Date/Time:  Friday November 18 2022 17:54:46 EDT Ventricular Rate:  80 PR Interval:  179 QRS Duration:  102 QT Interval:  412 QTC Calculation: 476 R Axis:   71  Text Interpretation: Sinus rhythm Low voltage, precordial leads Confirmed by Vanetta Mulders [phone]) on 11/18/2022 6:27:44 PM  Radiology DG Chest Port 1 View  Result Date: 11/18/2022 CLINICAL DATA:  Difficulty swallowing.  Hypotension. EXAM: PORTABLE CHEST 1 VIEW COMPARISON:  None Available. FINDINGS: 1655 hours. The heart size and mediastinal contours are normal. The lungs are clear. There is no pleural effusion or pneumothorax. No acute osseous findings are identified. Mild degenerative changes in the spine. IMPRESSION: No active cardiopulmonary process. Electronically Signed   By: Carey Bullocks M.D.   On: 11/18/2022 17:14    Procedures Procedures    Medications Ordered in ED Medications  0.9 %  sodium chloride infusion (0 mLs Intravenous Stopped 11/18/22 2153)  sodium chloride 0.9 % bolus 1,000 mL (0 mLs Intravenous Stopped 11/18/22 1800)  sodium chloride 0.9 % bolus 1,000 mL (0 mLs Intravenous Stopped 11/18/22 2152)    ED Course/ Medical Decision Making/ A&P                             Medical Decision Making Amount and/or Complexity of Data Reviewed Labs: ordered. Radiology: ordered.  Risk Prescription drug management.   Patient did take her blood pressure medicine this morning.  Workup here patient received 2 L bolus of fluid.  Then was able to void.  Blood pressures came up to the low 100s systolic and stayed there.  Lab workup troponins negative urinalysis negative CBC white count 11.8 hemoglobin 13.4 platelets 4 and 47 basic metabolic panel significant potassium being a little low at 3.2 GFR 47 for creatinine 1.27 go along with a little bit of dehydration LFTs normal lipase normal.  Chest x-ray negative.  Following fluids patient  felt much better.  Will have patient hold her blood pressure medicine follow back up with her primary care doctor on Monday.  Patient will return for any new or worse symptoms.  In addition patient will follow-up with gastroenterology as planned.  Patient is being followed by Dr. Dulce Sellar from [facility].  Recommending small amounts of food frequently and more of a liquid diet in the meantime.  Returning if anything get immediately gets stuck.  Is not sounds as if this is esophageal obstruction sounds more like there may be a gastric outlet problem.  Because the symptoms occur several hours later.   Final Clinical Impression(s) / ED Diagnoses Final diagnoses:  Hypotension due to hypovolemia    Rx / DC Orders ED Discharge Orders     None         Vanetta Mulders, MD 11/18/22 2156

## 2022-11-18 NOTE — Progress Notes (Signed)
Acute Office Visit  Subjective:     Patient ID: Alexandria Tucker, female    DOB: 1955/09/19, 67 y.o.   MRN: [id number]  Chief Complaint  Patient presents with   Gastroesophageal Reflux    Unable to keep anything down for about a week feels like it is getting stuck in her chest and coming back up. Unable to even keep water down.    Emesis  This is a new problem. Episode onset: 2 weeks. Episode frequency: if eating or drinking. The problem has been unchanged. The emesis has an appearance of stomach contents. There has been no fever. Pertinent negatives include no abdominal pain, chills, coughing, diarrhea, dizziness, fever or headaches.   Feels like food, liquids get stuck in the center of her chest and then she coughs, gags, and vomits it back up. She has only been able to check down small bites of food. She is having intermittent nausea. She has only been able to keep down a small amount of fluid without vomiting it back up. She has had minimal fluids for 2 week now, maybe 6-8 ounces in last 24 hours. Her mouth is feel dry. Denies dizziness or lightheadness.   She did start Lamisil for 4 days but then stopped taking this after 4 days. Vomiting started the day before lamisil though.   Review of Systems  Constitutional:  Negative for chills and fever.  Respiratory:  Negative for cough.   Gastrointestinal:  Positive for vomiting. Negative for abdominal pain and diarrhea.  Neurological:  Negative for dizziness and headaches.        Objective:    BP (!) 83/51   Pulse 89   Temp 97.8 F (36.6 C) (Temporal)   Ht 5\' 4"  (1.626 m)   Wt 191 lb 12.8 oz (87 kg)   SpO2 95%   BMI 32.92 kg/m  BP Readings from Last 3 Encounters:  11/18/22 (!) 83/51  08/30/22 112/69  08/02/22 113/69   Wt Readings from Last 3 Encounters:  11/18/22 191 lb 12.8 oz (87 kg)  08/30/22 201 lb (91.2 kg)  08/02/22 206 lb (93.4 kg)    Physical Exam Vitals and nursing note reviewed.  Constitutional:       General: She is not in acute distress.    Appearance: She is not ill-appearing, toxic-appearing or diaphoretic.  Cardiovascular:     Rate and Rhythm: Normal rate and regular rhythm.     Heart sounds: Normal heart sounds. No murmur heard. Pulmonary:     Effort: Pulmonary effort is normal. No respiratory distress.     Breath sounds: Normal breath sounds.  Abdominal:     General: Bowel sounds are normal. There is no distension.     Palpations: Abdomen is soft.     Tenderness: There is no abdominal tenderness. There is no guarding or rebound. Negative signs include Murphy's sign and McBurney's sign.  Musculoskeletal:     Right lower leg: No edema.     Left lower leg: No edema.  Skin:    General: Skin is warm and dry.  Neurological:     General: No focal deficit present.     Mental Status: She is alert and oriented to person, place, and time.     No results found for any visits on 11/18/22.      Assessment & Plan:   Alexandria Tucker was seen today for gastroesophageal reflux.  Diagnoses and all orders for this visit:  Hypotension due to hypovolemia  Dehydration  Nausea and  vomiting, unspecified vomiting type  BP is low. Discussed hypotension related to hypovolemia from emesis and inability to keep down PO fluids. Discussed need for IV fluids for dehydration. Discussed need for abdominal imaging to evaluate for cause of vomiting. Discussed ? Esophogeal stricture as she is report that foods and liquids seem to get stuck in the center of her chest. Discussed would need endoscopy for this. She is established with a GI. However I discussed emergent need for IV hydration for now. Her husband will take her to the ER now for further management.   The patient indicates understanding of these issues and agrees with the plan.  Gabriel Earing, FNP

## 2022-11-18 NOTE — ED Notes (Signed)
Patient ambulated to rest room. Denies dizziness. Steady gait.

## 2022-11-18 NOTE — ED Notes (Signed)
Reviewed AVS/discharge instruction with patient. Time allotted for and all questions answered. Patient is agreeable for d/c and escorted to ed exit by staff.  

## 2022-11-21 ENCOUNTER — Ambulatory Visit (INDEPENDENT_AMBULATORY_CARE_PROVIDER_SITE_OTHER): Payer: Medicare HMO | Admitting: Family

## 2022-11-21 ENCOUNTER — Encounter: Payer: Self-pay | Admitting: Family

## 2022-11-21 VITALS — BP 111/74 | HR 61 | Temp 97.6°F | Ht 64.0 in | Wt 191.4 lb

## 2022-11-21 DIAGNOSIS — R1310 Dysphagia, unspecified: Secondary | ICD-10-CM

## 2022-11-21 DIAGNOSIS — Z09 Encounter for follow-up examination after completed treatment for conditions other than malignant neoplasm: Secondary | ICD-10-CM | POA: Diagnosis not present

## 2022-11-21 DIAGNOSIS — E876 Hypokalemia: Secondary | ICD-10-CM | POA: Diagnosis not present

## 2022-11-21 DIAGNOSIS — E861 Hypovolemia: Secondary | ICD-10-CM | POA: Diagnosis not present

## 2022-11-21 NOTE — Patient Instructions (Signed)

## 2022-11-21 NOTE — Progress Notes (Signed)
   Subjective:    Patient ID: Alexandria Tucker, female    DOB: [birth year], 67 y.o.   MRN: [id number]  Chief Complaint  Patient presents with   Hospitalization Follow-up    HPI  Pt presents to the office today for hospital follow up. She was seen here and her BP was 83/51 and sent to the ED. Lab workup troponin negative and urinalysis.  Chest x-ray negative. Was given fluid bolus and her BP returned to normal. Told to hold BP medication.   She has a feeling food getting "stuck" and vomiting. She has a colonoscopy scheduled next week.   Review of Systems  All other systems reviewed and are negative.      Objective:   Physical Exam Vitals reviewed.  Constitutional:      General: She is not in acute distress.    Appearance: She is well-developed. She is obese.  HENT:     Head: Normocephalic and atraumatic.     Right Ear: Tympanic membrane normal.     Left Ear: Tympanic membrane normal.  Eyes:     Pupils: Pupils are equal, round, and reactive to light.  Neck:     Thyroid: No thyromegaly.  Cardiovascular:     Rate and Rhythm: Normal rate and regular rhythm.     Heart sounds: Normal heart sounds. No murmur heard. Pulmonary:     Effort: Pulmonary effort is normal. No respiratory distress.     Breath sounds: Normal breath sounds. No wheezing.  Abdominal:     General: Bowel sounds are normal. There is no distension.     Palpations: Abdomen is soft.     Tenderness: There is no abdominal tenderness.  Musculoskeletal:        General: No tenderness. Normal range of motion.     Cervical back: Normal range of motion and neck supple.  Skin:    General: Skin is warm and dry.  Neurological:     Mental Status: She is alert and oriented to person, place, and time.     Cranial Nerves: No cranial nerve deficit.     Deep Tendon Reflexes: Reflexes are normal and symmetric.  Psychiatric:        Behavior: Behavior normal.        Thought Content: Thought content normal.        Judgment:  Judgment normal.     BP 111/74   Pulse 61   Temp 97.6 F (36.4 C) (Temporal)   Ht 5\' 4"  (1.626 m)   Wt 191 lb 6.4 oz (86.8 kg)   SpO2 96%   BMI 32.85 kg/m       Assessment & Plan:  Alexandria Tucker comes in today with chief complaint of Hospitalization Follow-up   Diagnosis and orders addressed:  1. Hypotension due to hypovolemia - CMP14+EGFR  2. Hospital discharge follow-up - CMP14+EGFR  3. Hypokalemia - CMP14+EGFR  4. Dysphagia, unspecified type  - CMP14+EGFR   Labs pending Hospital notes reviewed BP stable today Will recheck K+ today Pt will call GI and tell them her symptoms of dysphagia as she is scheduled for colonoscopy next month .   Jannifer Rodney, FNP

## 2022-11-22 ENCOUNTER — Other Ambulatory Visit: Payer: Self-pay | Admitting: Family

## 2022-11-22 LAB — CMP14+EGFR
ALT: 24 IU/L (ref 0–32)
AST: 21 IU/L (ref 0–40)
Albumin: 4.4 g/dL (ref 3.9–4.9)
Alkaline Phosphatase: 74 IU/L (ref 44–121)
BUN/Creatinine Ratio: 15 (ref 12–28)
BUN: 14 mg/dL (ref 8–27)
Bilirubin Total: 0.5 mg/dL (ref 0.0–1.2)
CO2: 25 mmol/L (ref 20–29)
Calcium: 10.2 mg/dL (ref 8.7–10.3)
Chloride: 96 mmol/L (ref 96–106)
Creatinine, Ser: 0.96 mg/dL (ref 0.57–1.00)
Globulin, Total: 2.5 g/dL (ref 1.5–4.5)
Glucose: 124 mg/dL — ABNORMAL HIGH (ref 70–99)
Potassium: 3.1 mmol/L — ABNORMAL LOW (ref 3.5–5.2)
Sodium: 139 mmol/L (ref 134–144)
Total Protein: 6.9 g/dL (ref 6.0–8.5)
eGFR: 66 mL/min/{1.73_m2} (ref >59)

## 2022-11-22 MED ORDER — POTASSIUM CHLORIDE CRYS ER 20 MEQ PO TBCR: 20.0000 meq | EXTENDED_RELEASE_TABLET | Freq: Every day | ORAL | 0 refills | Status: DC

## 2022-11-25 ENCOUNTER — Telehealth: Payer: Self-pay

## 2022-11-25 NOTE — Telephone Encounter (Signed)
Transition Care Management Follow-up Telephone Call Date of discharge and from where: [facility] 7/19 How have you been since you were released from the hospital? Doing ok and has followed up with PCP Any questions or concerns? No  Items Reviewed: Did the pt receive and understand the discharge instructions provided? Yes  Medications obtained and verified? No  Other? No  Any new allergies since your discharge? No  Dietary orders reviewed? No Do you have support at home? Yes    Follow up appointments reviewed:  PCP Hospital f/u appt confirmed? Yes  Scheduled to see PCP on 7/22 @ . Specialist Hospital f/u appt confirmed? No  Scheduled to see  on  @ . Are transportation arrangements needed? No  If their condition worsens, is the pt aware to call PCP or go to the Emergency Dept.? Yes Was the patient provided with contact information for the PCP's office or ED? Yes Was to pt encouraged to call back with questions or concerns? Yes

## 2022-12-07 DIAGNOSIS — K5730 Diverticulosis of large intestine without perforation or abscess without bleeding: Secondary | ICD-10-CM | POA: Diagnosis not present

## 2022-12-07 DIAGNOSIS — Z09 Encounter for follow-up examination after completed treatment for conditions other than malignant neoplasm: Secondary | ICD-10-CM | POA: Diagnosis not present

## 2022-12-07 DIAGNOSIS — K648 Other hemorrhoids: Secondary | ICD-10-CM | POA: Diagnosis not present

## 2022-12-07 DIAGNOSIS — R1310 Dysphagia, unspecified: Secondary | ICD-10-CM | POA: Diagnosis not present

## 2022-12-07 DIAGNOSIS — D123 Benign neoplasm of transverse colon: Secondary | ICD-10-CM | POA: Diagnosis not present

## 2022-12-07 DIAGNOSIS — K2289 Other specified disease of esophagus: Secondary | ICD-10-CM | POA: Diagnosis not present

## 2022-12-07 DIAGNOSIS — Z86010 Personal history of colonic polyps: Secondary | ICD-10-CM | POA: Diagnosis not present

## 2022-12-12 DIAGNOSIS — D123 Benign neoplasm of transverse colon: Secondary | ICD-10-CM | POA: Diagnosis not present

## 2022-12-12 DIAGNOSIS — K2289 Other specified disease of esophagus: Secondary | ICD-10-CM | POA: Diagnosis not present

## 2022-12-16 ENCOUNTER — Other Ambulatory Visit: Payer: Self-pay | Admitting: Family

## 2022-12-27 MED ORDER — OMEPRAZOLE 40 MG PO CPDR: 40.0000 mg | DELAYED_RELEASE_CAPSULE | Freq: Every day | ORAL | 1 refills | Status: DC

## 2023-02-03 ENCOUNTER — Other Ambulatory Visit: Payer: Self-pay | Admitting: Family

## 2023-02-03 ENCOUNTER — Other Ambulatory Visit (INDEPENDENT_AMBULATORY_CARE_PROVIDER_SITE_OTHER): Payer: Medicare HMO

## 2023-02-03 ENCOUNTER — Ambulatory Visit: Payer: Medicare HMO | Admitting: Family

## 2023-02-03 ENCOUNTER — Encounter: Payer: Self-pay | Admitting: Family

## 2023-02-03 VITALS — BP 127/81 | HR 99 | Temp 98.4°F | Ht 64.0 in | Wt 188.4 lb

## 2023-02-03 DIAGNOSIS — Z0001 Encounter for general adult medical examination with abnormal findings: Secondary | ICD-10-CM | POA: Diagnosis not present

## 2023-02-03 DIAGNOSIS — E1159 Type 2 diabetes mellitus with other circulatory complications: Secondary | ICD-10-CM

## 2023-02-03 DIAGNOSIS — E1169 Type 2 diabetes mellitus with other specified complication: Secondary | ICD-10-CM

## 2023-02-03 DIAGNOSIS — I152 Hypertension secondary to endocrine disorders: Secondary | ICD-10-CM

## 2023-02-03 DIAGNOSIS — E669 Obesity, unspecified: Secondary | ICD-10-CM

## 2023-02-03 DIAGNOSIS — E785 Hyperlipidemia, unspecified: Secondary | ICD-10-CM

## 2023-02-03 DIAGNOSIS — Z23 Encounter for immunization: Secondary | ICD-10-CM

## 2023-02-03 DIAGNOSIS — Z780 Asymptomatic menopausal state: Secondary | ICD-10-CM | POA: Diagnosis not present

## 2023-02-03 DIAGNOSIS — Z0000 Encounter for general adult medical examination without abnormal findings: Secondary | ICD-10-CM

## 2023-02-03 DIAGNOSIS — Z1211 Encounter for screening for malignant neoplasm of colon: Secondary | ICD-10-CM

## 2023-02-03 DIAGNOSIS — M85851 Other specified disorders of bone density and structure, right thigh: Secondary | ICD-10-CM | POA: Diagnosis not present

## 2023-02-03 DIAGNOSIS — K219 Gastro-esophageal reflux disease without esophagitis: Secondary | ICD-10-CM

## 2023-02-03 DIAGNOSIS — Z1212 Encounter for screening for malignant neoplasm of rectum: Secondary | ICD-10-CM

## 2023-02-03 DIAGNOSIS — M8580 Other specified disorders of bone density and structure, unspecified site: Secondary | ICD-10-CM | POA: Insufficient documentation

## 2023-02-03 LAB — BAYER DCA HB A1C WAIVED: HB A1C (BAYER DCA - WAIVED): 5.8 % — ABNORMAL HIGH (ref 4.8–5.6)

## 2023-02-03 MED ORDER — PANTOPRAZOLE SODIUM 20 MG PO TBEC: 20.0000 mg | DELAYED_RELEASE_TABLET | Freq: Every day | ORAL | 1 refills | Status: DC

## 2023-02-03 NOTE — Patient Instructions (Signed)
Gastroesophageal Reflux Disease, Adult    Gastroesophageal reflux (GER) happens when acid from the stomach flows up into the tube that connects the mouth and the stomach (esophagus). Normally, food travels down the esophagus and stays in the stomach to be digested. However, when a person has GER, food and stomach acid sometimes move back up into the esophagus. If this becomes a more serious problem, the person may be diagnosed with a disease called gastroesophageal reflux disease (GERD). GERD occurs when the reflux:  Happens often.  Causes frequent or severe symptoms.  Causes problems such as damage to the esophagus.  When stomach acid comes in contact with the esophagus, the acid may cause inflammation in the esophagus. Over time, GERD may create small holes (ulcers) in the lining of the esophagus.  What are the causes?  This condition is caused by a problem with the muscle between the esophagus and the stomach (lower esophageal sphincter, or LES). Normally, the LES muscle closes after food passes through the esophagus to the stomach. When the LES is weakened or abnormal, it does not close properly, and that allows food and stomach acid to go back up into the esophagus.  The LES can be weakened by certain dietary substances, medicines, and medical conditions, including:  Tobacco use.  Pregnancy.  Having a hiatal hernia.  Alcohol use.  Certain foods and beverages, such as coffee, chocolate, onions, and peppermint.  What increases the risk?  You are more likely to develop this condition if you:  Have an increased body weight.  Have a connective tissue disorder.  Take NSAIDs, such as ibuprofen.  What are the signs or symptoms?  Symptoms of this condition include:  Heartburn.  Difficult or painful swallowing and the feeling of having a lump in the throat.  A bitter taste in the mouth.  Bad breath and having a large amount of saliva.  Having an upset or bloated stomach and belching.  Chest pain. Different conditions can  cause chest pain. Make sure you see your health care provider if you experience chest pain.  Shortness of breath or wheezing.  Ongoing (chronic) cough or a nighttime cough.  Wearing away of tooth enamel.  Weight loss.  How is this diagnosed?  This condition may be diagnosed based on a medical history and a physical exam. To determine if you have mild or severe GERD, your health care provider may also monitor how you respond to treatment. You may also have tests, including:  A test to examine your stomach and esophagus with a small camera (endoscopy).  A test that measures the acidity level in your esophagus.  A test that measures how much pressure is on your esophagus.  A barium swallow or modified barium swallow test to show the shape, size, and functioning of your esophagus.  How is this treated?  Treatment for this condition may vary depending on how severe your symptoms are. Your health care provider may recommend:  Changes to your diet.  Medicine.  Surgery.  The goal of treatment is to help relieve your symptoms and to prevent complications.  Follow these instructions at home:  Eating and drinking    Follow a diet as recommended by your health care provider. This may involve avoiding foods and drinks such as:  Coffee and tea, with or without caffeine.  Drinks that contain alcohol.  Energy drinks and sports drinks.  Carbonated drinks or sodas.  Chocolate and cocoa.  Peppermint and mint flavorings.  Garlic and onions.  Horseradish.  Spicy and acidic foods, including peppers, chili powder, curry powder, vinegar, hot sauces, and barbecue sauce.  Citrus fruit juices and citrus fruits, such as oranges, lemons, and limes.  Tomato-based foods, such as red sauce, chili, salsa, and pizza with red sauce.  Fried and fatty foods, such as donuts, french fries, potato chips, and high-fat dressings.  High-fat meats, such as hot dogs and fatty cuts of red and white meats, such as rib eye steak, sausage, ham, and  bacon.  High-fat dairy items, such as whole milk, butter, and cream cheese.  Eat small, frequent meals instead of large meals.  Avoid drinking large amounts of liquid with your meals.  Avoid eating meals during the 2-3 hours before bedtime.  Avoid lying down right after you eat.  Do not exercise right after you eat.  Lifestyle    Do not use any products that contain nicotine or tobacco. These products include cigarettes, chewing tobacco, and vaping devices, such as e-cigarettes. If you need help quitting, ask your health care provider.  Try to reduce your stress by using methods such as yoga or meditation. If you need help reducing stress, ask your health care provider.  If you are overweight, reduce your weight to an amount that is healthy for you. Ask your health care provider for guidance about a safe weight loss goal.  General instructions  Pay attention to any changes in your symptoms.  Take over-the-counter and prescription medicines only as told by your health care provider. Do not take aspirin, ibuprofen, or other NSAIDs unless your health care provider told you to take these medicines.  Wear loose-fitting clothing. Do not wear anything tight around your waist that causes pressure on your abdomen.  Raise (elevate) the head of your bed about 6 inches (15 cm). You can use a wedge to do this.  Avoid bending over if this makes your symptoms worse.  Keep all follow-up visits. This is important.  Contact a health care provider if:  You have:  New symptoms.  Unexplained weight loss.  Difficulty swallowing or it hurts to swallow.  Wheezing or a persistent cough.  A hoarse voice.  Your symptoms do not improve with treatment.  Get help right away if:  You have sudden pain in your arms, neck, jaw, teeth, or back.  You suddenly feel sweaty, dizzy, or light-headed.  You have chest pain or shortness of breath.  You vomit and the vomit is green, yellow, or black, or it looks like blood or coffee grounds.  You faint.  You  have stool that is red, bloody, or black.  You cannot swallow, drink, or eat.  These symptoms may represent a serious problem that is an emergency. Do not wait to see if the symptoms will go away. Get medical help right away. Call your local emergency services (911 in the [place] Do not drive yourself to the hospital.  Summary  Gastroesophageal reflux happens when acid from the stomach flows up into the esophagus. GERD is a disease in which the reflux happens often, causes frequent or severe symptoms, or causes problems such as damage to the esophagus.  Treatment for this condition may vary depending on how severe your symptoms are. Your health care provider may recommend diet and lifestyle changes, medicine, or surgery.  Contact a health care provider if you have new or worsening symptoms.  Take over-the-counter and prescription medicines only as told by your health care provider. Do not take aspirin, ibuprofen, or other NSAIDs  unless your health care provider told you to do so.  Keep all follow-up visits as told by your health care provider. This is important.  This information is not intended to replace advice given to you by your health care provider. Make sure you discuss any questions you have with your health care provider.  Document Revised: 10/26/2019 Document Reviewed: 10/28/2019  Elsevier Patient Education  2024 [employer].

## 2023-02-03 NOTE — Progress Notes (Signed)
Subjective:    Patient ID: Alexandria Tucker, female    DOB: December 24, 1955, 67 y.o.   MRN: [id number]  Chief Complaint  Patient presents with   Medical Management of Chronic Issues   PT presents to the office today for CPE and chronic follow up. She is morbid obese with a BMI of 32 with DM and HTN.    She is complaining of bilateral burning and tingling pain of feet of 5 out 10 that is improving since taking gabapentin 300 mg nightly.   She is followed by Endocrinologists for DM.  She is followed by Dermatologists as needed.  Hypertension This is a chronic problem. The current episode started more than 1 year ago. The problem has been resolved since onset. The problem is controlled. Associated symptoms include malaise/fatigue. Pertinent negatives include no blurred vision, peripheral edema or shortness of breath. Risk factors for coronary artery disease include dyslipidemia, diabetes mellitus and obesity. The current treatment provides moderate improvement.  Gastroesophageal Reflux She complains of belching and heartburn. This is a chronic problem. The current episode started more than 1 year ago. The problem occurs occasionally. Risk factors include obesity. She has tried a PPI for the symptoms. The treatment provided moderate relief.  Hyperlipidemia This is a chronic problem. The current episode started more than 1 year ago. The problem is controlled. Recent lipid tests were reviewed and are normal. Exacerbating diseases include obesity. Pertinent negatives include no shortness of breath. Current antihyperlipidemic treatment includes statins. The current treatment provides moderate improvement of lipids. Risk factors for coronary artery disease include dyslipidemia, diabetes mellitus, hypertension, a sedentary lifestyle and post-menopausal.  Diabetes She presents for her follow-up diabetic visit. She has type 2 diabetes mellitus. Associated symptoms include foot paresthesias. Pertinent negatives  for diabetes include no blurred vision. Symptoms are stable. Diabetic complications include peripheral neuropathy. Risk factors for coronary artery disease include dyslipidemia, diabetes mellitus, hypertension, sedentary lifestyle and post-menopausal. She is following a generally healthy diet. Her overall blood glucose range is 110-130 mg/dl. Eye exam is current.      Review of Systems  Constitutional:  Positive for malaise/fatigue.  Eyes:  Negative for blurred vision.  Respiratory:  Negative for shortness of breath.   Gastrointestinal:  Positive for heartburn.  All other systems reviewed and are negative.   Family History  Problem Relation Age of Onset   Hypertension Mother    Heart disease Father    Heart disease Brother    Hypertension Brother    Hypertension Brother    Social History   Socioeconomic History   Marital status: Married    Spouse name: Not on file   Number of children: Not on file   Years of education: Not on file   Highest education level: Not on file  Occupational History   Not on file  Tobacco Use   Smoking status: Never   Smokeless tobacco: Never  Vaping Use   Vaping status: Never Used  Substance and Sexual Activity   Alcohol use: Yes    Alcohol/week: 0.0 standard drinks of alcohol    Comment: social   Drug use: No   Sexual activity: Not on file  Other Topics Concern   Not on file  Social History Narrative   Not on file   Social Determinants of Health   Financial Resource Strain: Not on file  Food Insecurity: Not on file  Transportation Needs: Not on file  Physical Activity: Not on file  Stress: Not on file  Social  Connections: Not on file       Objective:   Physical Exam Vitals reviewed.  Constitutional:      General: She is not in acute distress.    Appearance: She is well-developed. She is obese.  HENT:     Head: Normocephalic and atraumatic.     Right Ear: Tympanic membrane normal.     Left Ear: Tympanic membrane normal.   Eyes:     Pupils: Pupils are equal, round, and reactive to light.  Neck:     Thyroid: No thyromegaly.  Cardiovascular:     Rate and Rhythm: Normal rate and regular rhythm.     Heart sounds: Normal heart sounds. No murmur heard. Pulmonary:     Effort: Pulmonary effort is normal. No respiratory distress.     Breath sounds: Normal breath sounds. No wheezing.  Abdominal:     General: Bowel sounds are normal. There is no distension.     Palpations: Abdomen is soft.     Tenderness: There is no abdominal tenderness.  Musculoskeletal:        General: No tenderness. Normal range of motion.     Cervical back: Normal range of motion and neck supple.  Skin:    General: Skin is warm and dry.  Neurological:     Mental Status: She is alert and oriented to person, place, and time.     Cranial Nerves: No cranial nerve deficit.     Deep Tendon Reflexes: Reflexes are normal and symmetric.  Psychiatric:        Behavior: Behavior normal.        Thought Content: Thought content normal.        Judgment: Judgment normal.     Diabetic Foot Exam - Simple   Simple Foot Form Diabetic Foot exam was performed with the following findings: Yes 02/03/2023  8:22 AM  Visual Inspection No deformities, no ulcerations, no other skin breakdown bilaterally: Yes Sensation Testing Intact to touch and monofilament testing bilaterally: Yes Pulse Check Posterior Tibialis and Dorsalis pulse intact bilaterally: Yes Comments      BP 127/81   Pulse 99   Temp 98.4 F (36.9 C) (Temporal)   Ht 5\' 4"  (1.626 m)   Wt 188 lb 6.4 oz (85.5 kg)   SpO2 96%   BMI 32.34 kg/m      Assessment & Plan:  Alexandria Tucker comes in today with chief complaint of Medical Management of Chronic Issues   Diagnosis and orders addressed:  1. Annual physical exam - Bayer DCA Hb A1c Waived - CBC with Differential/Platelet - CMP14+EGFR - Lipid panel - TSH - DG [facility] DEXA - Microalbumin / creatinine urine ratio  2. Type 2  diabetes mellitus with other specified complication, without long-term current use of insulin (HCC) - Bayer DCA Hb A1c Waived - CBC with Differential/Platelet - CMP14+EGFR - Microalbumin / creatinine urine ratio  3. Gastroesophageal reflux disease, unspecified whether esophagitis present  - CBC with Differential/Platelet - CMP14+EGFR  4. Hyperlipidemia associated with type 2 diabetes mellitus (HCC) - CBC with Differential/Platelet - CMP14+EGFR  5. Hypertension associated with diabetes (HCC) - CBC with Differential/Platelet - CMP14+EGFR  6. Obesity (BMI 30-39.9) - CBC with Differential/Platelet - CMP14+EGFR  7. Post-menopause - DG [facility] DEXA   Labs pending Continue current medications  Health Maintenance reviewed Diet and exercise encouraged  Follow up plan: 6 months    Jannifer Rodney, FNP

## 2023-02-04 LAB — CBC WITH DIFFERENTIAL/PLATELET
Basophils Absolute: 0.1 10*3/uL (ref 0.0–0.2)
Basos: 1 %
EOS (ABSOLUTE): 0.3 10*3/uL (ref 0.0–0.4)
Eos: 3 %
Hematocrit: 41.6 % (ref 34.0–46.6)
Hemoglobin: 13.4 g/dL (ref 11.1–15.9)
Immature Grans (Abs): 0 10*3/uL (ref 0.0–0.1)
Immature Granulocytes: 0 %
Lymphocytes Absolute: 2 10*3/uL (ref 0.7–3.1)
Lymphs: 19 %
MCH: 29.6 pg (ref 26.6–33.0)
MCHC: 32.2 g/dL (ref 31.5–35.7)
MCV: 92 fL (ref 79–97)
Monocytes Absolute: 0.8 10*3/uL (ref 0.1–0.9)
Monocytes: 8 %
Neutrophils Absolute: 7.4 10*3/uL — ABNORMAL HIGH (ref 1.4–7.0)
Neutrophils: 69 %
Platelets: 353 10*3/uL (ref 150–450)
RBC: 4.53 x10E6/uL (ref 3.77–5.28)
RDW: 13.2 % (ref 11.7–15.4)
WBC: 10.6 10*3/uL (ref 3.4–10.8)

## 2023-02-04 LAB — CMP14+EGFR
ALT: 20 IU/L (ref 0–32)
AST: 22 IU/L (ref 0–40)
Albumin: 4.8 g/dL (ref 3.9–4.9)
Alkaline Phosphatase: 87 IU/L (ref 44–121)
BUN/Creatinine Ratio: 16 (ref 12–28)
BUN: 20 mg/dL (ref 8–27)
Bilirubin Total: 0.6 mg/dL (ref 0.0–1.2)
CO2: 23 mmol/L (ref 20–29)
Calcium: 10.9 mg/dL — ABNORMAL HIGH (ref 8.7–10.3)
Chloride: 96 mmol/L (ref 96–106)
Creatinine, Ser: 1.25 mg/dL — ABNORMAL HIGH (ref 0.57–1.00)
Globulin, Total: 2.5 g/dL (ref 1.5–4.5)
Glucose: 119 mg/dL — ABNORMAL HIGH (ref 70–99)
Potassium: 4.1 mmol/L (ref 3.5–5.2)
Sodium: 139 mmol/L (ref 134–144)
Total Protein: 7.3 g/dL (ref 6.0–8.5)
eGFR: 48 mL/min/{1.73_m2} — ABNORMAL LOW (ref >59)

## 2023-02-04 LAB — LIPID PANEL
Cholesterol, Total: 101 mg/dL (ref 100–199)
HDL: 29 mg/dL — ABNORMAL LOW (ref >39)
LDL CALC COMMENT:: 3.5 ratio (ref 0.0–4.4)
LDL Chol Calc (NIH): 36 mg/dL (ref 0–99)
Triglycerides: 232 mg/dL — ABNORMAL HIGH (ref 0–149)
VLDL Cholesterol Cal: 36 mg/dL (ref 5–40)

## 2023-02-04 LAB — MICROALBUMIN / CREATININE URINE RATIO
Creatinine, Urine: 141.4 mg/dL
Microalb/Creat Ratio: 432 mg/g{creat} — ABNORMAL HIGH (ref 0–29)
Microalbumin, Urine: 611.1 ug/mL

## 2023-02-04 LAB — TSH: TSH: 5.09 u[IU]/mL — ABNORMAL HIGH (ref 0.450–4.500)

## 2023-02-06 ENCOUNTER — Other Ambulatory Visit: Payer: Self-pay | Admitting: Family

## 2023-02-06 MED ORDER — LEVOTHYROXINE SODIUM 25 MCG PO TABS: 25.0000 ug | ORAL_TABLET | Freq: Every day | ORAL | 0 refills | Status: DC

## 2023-02-09 ENCOUNTER — Other Ambulatory Visit: Payer: Self-pay | Admitting: Family

## 2023-02-09 DIAGNOSIS — I10 Essential (primary) hypertension: Secondary | ICD-10-CM

## 2023-02-09 DIAGNOSIS — I152 Hypertension secondary to endocrine disorders: Secondary | ICD-10-CM

## 2023-02-21 ENCOUNTER — Other Ambulatory Visit: Payer: Self-pay | Admitting: Family

## 2023-02-21 DIAGNOSIS — I10 Essential (primary) hypertension: Secondary | ICD-10-CM

## 2023-02-27 ENCOUNTER — Ambulatory Visit (INDEPENDENT_AMBULATORY_CARE_PROVIDER_SITE_OTHER): Payer: Medicare HMO | Admitting: Family

## 2023-02-27 ENCOUNTER — Encounter: Payer: Self-pay | Admitting: Family

## 2023-02-27 VITALS — BP 102/60 | HR 80 | Temp 97.8°F | Ht 64.0 in | Wt 185.0 lb

## 2023-02-27 DIAGNOSIS — Z23 Encounter for immunization: Secondary | ICD-10-CM

## 2023-02-27 DIAGNOSIS — Z0000 Encounter for general adult medical examination without abnormal findings: Secondary | ICD-10-CM

## 2023-02-27 NOTE — Patient Instructions (Addendum)
Osteopenia  Osteopenia is a loss of thickness (density) inside the bones. Another name for osteopenia is low bone mass. Mild osteopenia is a normal part of aging. It is not a disease, and it does not cause symptoms. However, if you have osteopenia and continue to lose bone mass, you could develop a condition that causes the bones to become thin and break more easily (osteoporosis). Osteoporosis can cause you to lose some height, have back pain, and have a stooped posture. Although osteopenia is not a disease, making changes to your lifestyle and diet can help to prevent osteopenia from developing into osteoporosis. What are the causes? Osteopenia is caused by loss of calcium in the bones. Bones are constantly changing. Old bone cells are continually being replaced with new bone cells. This process builds new bone. The mineral calcium is needed to build new bone and maintain bone density. Bone density is usually highest around age 94. After that, most people's bodies cannot replace all the bone they have lost with new bone. What increases the risk? You are more likely to develop this condition if: You are older than age 53. You are a woman who went through menopause early. You have a long illness that keeps you in bed. You do not get enough exercise. You lack certain nutrients (malnutrition). You have an overactive thyroid gland (hyperthyroidism). You use products that contain nicotine or tobacco, such as cigarettes, e-cigarettes and chewing tobacco, or you drink a lot of alcohol. You are taking medicines that weaken the bones, such as steroids. What are the signs or symptoms? This condition does not cause any symptoms. You may have a slightly higher risk for bone breaks (fractures), so getting fractures more easily than normal may be an indication of osteopenia. How is this diagnosed? This condition may be diagnosed based on an X-ray exam that measures bone density (dual-energy X-ray  absorptiometry, or DEXA). This test can measure bone density in your hips, spine, and wrists. Osteopenia has no symptoms, so this condition is usually diagnosed after a routine bone density screening test is done for osteoporosis. This routine screening is usually done for: Women who are age 63 or older. Men who are age 41 or older. If you have risk factors for osteopenia, you may have the screening test at an earlier age. How is this treated? Making dietary and lifestyle changes can lower your risk for osteoporosis. If you have severe osteopenia that is close to becoming osteoporosis, this condition can be treated with medicines and dietary supplements such as calcium and vitamin D. These supplements help to rebuild bone density. Follow these instructions at home: Eating and drinking Eat a diet that is high in calcium and vitamin D. Calcium is found in dairy products, beans, salmon, and leafy green vegetables like spinach and broccoli. Look for foods that have vitamin D and calcium added to them (fortified foods), such as orange juice, cereal, and bread.  Lifestyle Do 30 minutes or more of a weight-bearing exercise every day, such as walking, jogging, or playing a sport. These types of exercises strengthen the bones. Do not use any products that contain nicotine or tobacco, such as cigarettes, e-cigarettes, and chewing tobacco. If you need help quitting, ask your health care provider. Do not drink alcohol if: Your health care provider tells you not to drink. You are pregnant, may be pregnant, or are planning to become pregnant. If you drink alcohol: Limit how much you use to: 0-1 drink a day for women. 0-2  drinks a day for men. Be aware of how much alcohol is in your drink. In the [place] one drink equals one 12 oz bottle of beer (355 mL), one 5 oz glass of wine (148 mL), or one 1 oz glass of hard liquor (44 mL). General instructions Take over-the-counter and prescription medicines only as  told by your health care provider. These include vitamins and supplements. Take precautions at home to lower your risk of falling, such as: Keeping rooms well-lit and free of clutter, such as cords. Installing safety rails on stairs. Using rubber mats in the bathroom or other areas that are often wet or slippery. Keep all follow-up visits. This is important. Contact a health care provider if: You have not had a bone density screening for osteoporosis and you are: A woman who is age 72 or older. A man who is age 15 or older. You are a postmenopausal woman who has not had a bone density screening for osteoporosis. You are older than age 37 and you want to know if you should have bone density screening for osteoporosis. Summary Osteopenia is a loss of thickness (density) inside the bones. Another name for osteopenia is low bone mass. Osteopenia is not a disease, but it may increase your risk for a condition that causes the bones to become thin and break more easily (osteoporosis). You may be at risk for osteopenia if you are older than age 56 or if you are a woman who went through early menopause. Osteopenia does not cause any symptoms, but it can be diagnosed with a bone density screening test. Dietary and lifestyle changes are the first treatment for osteopenia. These may lower your risk for osteoporosis. This information is not intended to replace advice given to you by your health care provider. Make sure you discuss any questions you have with your health care provider. Document Revised: 10/03/2019 Document Reviewed: 10/03/2019 Elsevier Patient Education  2024 [employer].

## 2023-02-27 NOTE — Progress Notes (Signed)
Subjective:    Alexandria Tucker is a 67 y.o. female who presents for a Welcome to Medicare exam.          Objective:    Today's Vitals   02/27/23 0902  BP: 102/60  Pulse: 80  Temp: 97.8 F (36.6 C)  TempSrc: Temporal  Weight: 185 lb (83.9 kg)  Height: 5\' 4"  (1.626 m)  Body mass index is 31.76 kg/m.  Medications Outpatient Encounter Medications as of 02/27/2023  Medication Sig   aspirin EC 81 MG tablet Take 81 mg by mouth daily.   blood glucose meter kit and supplies Dispense based on patient and insurance preference. Use up to four times daily as directed. (FOR ICD-10 E10.9, E11.9).   fexofenadine (ALLEGRA) 180 MG tablet Take 1 tablet (180 mg total) by mouth daily.   fluticasone (FLONASE) 50 MCG/ACT nasal spray One to 2 sprays each nostril at bedtime   gabapentin (NEURONTIN) 300 MG capsule Take 1 capsule (300 mg total) by mouth at bedtime.   hydrochlorothiazide (HYDRODIURIL) 25 MG tablet Take 1 tablet by mouth once daily   levothyroxine (SYNTHROID) 25 MCG tablet TAKE 1 TABLET BY MOUTH ONCE DAILY BEFORE  BREAKFAST   meloxicam (MOBIC) 15 MG tablet Take 1 tablet (15 mg total) by mouth daily.   metFORMIN (GLUCOPHAGE) 1000 MG tablet Take 1 tablet (1,000 mg total) by mouth 2 (two) times daily with a meal.   OZEMPIC, 0.25 OR 0.5 MG/DOSE, 2 MG/3ML SOPN Inject into the skin.   pantoprazole (PROTONIX) 20 MG tablet Take 1 tablet (20 mg total) by mouth daily.   potassium chloride SA (KLOR-CON M20) 20 MEQ tablet Take 1 tablet by mouth once daily   ramipril (ALTACE) 10 MG capsule Take 1 capsule by mouth once daily   rosuvastatin (CRESTOR) 20 MG tablet Take 1 tablet (20 mg total) by mouth daily.   terbinafine (LAMISIL) 250 MG tablet Take 1 tablet (250 mg total) by mouth daily.   No facility-administered encounter medications on file as of 02/27/2023.     History: Past Medical History:  Diagnosis Date   Breast mass, right    Diabetes mellitus without complication (HCC)    History  of kidney stones    Hyperlipidemia    Hypertension    Past Surgical History:  Procedure Laterality Date   ABDOMINAL HYSTERECTOMY     BREAST EXCISIONAL BIOPSY Right 2017   Excision for Rt. Infected Duct   BREAST LUMPECTOMY WITH RADIOACTIVE SEED LOCALIZATION Right 04/01/2016   Procedure: RIGHT BREAST LUMPECTOMY WITH RADIOACTIVE SEED LOCALIZATION;  Surgeon: Glenna Fellows, MD;  Location: [facility];  Service: General;  Laterality: Right;   CESAREAN SECTION     CYSTOSCOPY/RETROGRADE/URETEROSCOPY Right 11/23/2016   Procedure: CYSTOSCOPY/RIGHT RETROGRADE/URETEROSCOPY LASER LITHOTRIPSY AND STONE REMOVAL ;  Surgeon: Heloise Purpura, MD;  Location: [facility];  Service: Urology;  Laterality: Right;   Kidney stones     TUBAL LIGATION      Family History  Problem Relation Age of Onset   Hypertension Mother    Heart disease Father    Heart disease Brother    Hypertension Brother    Hypertension Brother    Social History   Occupational History   Not on file  Tobacco Use   Smoking status: Never   Smokeless tobacco: Never  Vaping Use   Vaping status: Never Used  Substance and Sexual Activity   Alcohol use: Yes    Alcohol/week: 0.0 standard drinks of alcohol    Comment: social  Drug use: No   Sexual activity: Not on file    Tobacco Counseling Counseling given: Not Answered   Immunizations and Health Maintenance Immunization History  Administered Date(s) Administered   Influenza Split 02/24/2013   Influenza,inj,Quad PF,6+ Mos 07/04/2014, 04/10/2015, 01/22/2016, 03/03/2017, 02/08/2018, 02/08/2019, 03/05/2020, 01/15/2021, 01/17/2022   Moderna SARS-COV2 Booster Vaccination 04/22/2020   Moderna Sars-Covid-2 Vaccination 07/05/2019, 08/06/2019, 12/23/2020   Pneumococcal Conjugate-13 07/04/2014   Pneumococcal Polysaccharide-23 06/07/2019   Tdap 01/14/2011, 02/03/2023   Zoster Recombinant(Shingrix) 12/14/2018   Zoster, Live 12/06/2019   Health Maintenance Due  Topic  Date Due   COVID-19 Vaccine (4 - 2023-24 season) 01/01/2023    Activities of Daily Living     No data to display          Physical Exam   Physical Exam (optional), or other factors deemed appropriate based on the beneficiary's medical and social history and current clinical standards.   Advanced Directives:    EKG:  normal EKG, normal sinus rhythm, unchanged from previous tracings      Assessment:    This is a routine wellness examination for this patient . No complaints today.   Vision/Hearing screen No results found.   Goals   None     Depression Screen    02/27/2023    8:57 AM 03/29/2022   11:02 AM 07/19/2021    7:59 AM 01/15/2021    8:27 AM  PHQ 2/9 Scores  PHQ - 2 Score 0 0 0 0  PHQ- 9 Score 0 0  0     Fall Risk    08/30/2022   11:12 AM  Fall Risk   Falls in the past year? 0  Number falls in past yr: 0  Injury with Fall? 0  Risk for fall due to : No Fall Risks  Follow up Falls evaluation completed    Cognitive Function:    02/27/2023    9:00 AM  MMSE - Mini Mental State Exam  Orientation to time 5  Orientation to Place 5  Registration 3  Attention/ Calculation 5  Recall 3  Language- name 2 objects 2  Language- repeat 1  Language- follow 3 step command 3  Language- read & follow direction 1  Write a sentence 1  Copy design 1  Total score 30        Patient Care Team: Junie Spencer, FNP as PCP - General (Nurse Practitioner) Alexia Freestone, A. Azucena Kuba, MD (Ophthalmology) Marlow Baars, MD as Consulting Physician (Obstetrics)     Plan:     I have personally reviewed and noted the following in the patient's chart:   Medical and social history Use of alcohol, tobacco or illicit drugs  Current medications and supplements Functional ability and status Nutritional status Physical activity Advanced directives List of other physicians Hospitalizations, surgeries, and ER visits in previous 12 months Vitals Screenings to include  cognitive, depression, and falls Referrals and appointments  In addition, I have reviewed and discussed with patient certain preventive protocols, quality metrics, and best practice recommendations. A written personalized care plan for preventive services as well as general preventive health recommendations were provided to patient.     Jannifer Rodney, [place] 02/27/2023

## 2023-03-27 ENCOUNTER — Ambulatory Visit (INDEPENDENT_AMBULATORY_CARE_PROVIDER_SITE_OTHER): Payer: Medicare HMO

## 2023-03-27 DIAGNOSIS — Z23 Encounter for immunization: Secondary | ICD-10-CM | POA: Diagnosis not present

## 2023-04-07 DIAGNOSIS — H524 Presbyopia: Secondary | ICD-10-CM | POA: Diagnosis not present

## 2023-04-07 DIAGNOSIS — Z0100 Encounter for examination of eyes and vision without abnormal findings: Secondary | ICD-10-CM | POA: Diagnosis not present

## 2023-04-07 DIAGNOSIS — E119 Type 2 diabetes mellitus without complications: Secondary | ICD-10-CM | POA: Diagnosis not present

## 2023-04-07 LAB — HM DIABETES EYE EXAM

## 2023-04-12 DIAGNOSIS — E1165 Type 2 diabetes mellitus with hyperglycemia: Secondary | ICD-10-CM | POA: Diagnosis not present

## 2023-04-19 ENCOUNTER — Other Ambulatory Visit: Payer: Self-pay | Admitting: Family

## 2023-04-19 DIAGNOSIS — I152 Hypertension secondary to endocrine disorders: Secondary | ICD-10-CM

## 2023-04-19 DIAGNOSIS — I10 Essential (primary) hypertension: Secondary | ICD-10-CM

## 2023-04-19 DIAGNOSIS — E1165 Type 2 diabetes mellitus with hyperglycemia: Secondary | ICD-10-CM | POA: Diagnosis not present

## 2023-04-19 DIAGNOSIS — E119 Type 2 diabetes mellitus without complications: Secondary | ICD-10-CM

## 2023-04-19 DIAGNOSIS — E785 Hyperlipidemia, unspecified: Secondary | ICD-10-CM | POA: Diagnosis not present

## 2023-04-19 DIAGNOSIS — E1129 Type 2 diabetes mellitus with other diabetic kidney complication: Secondary | ICD-10-CM | POA: Diagnosis not present

## 2023-04-19 DIAGNOSIS — E1169 Type 2 diabetes mellitus with other specified complication: Secondary | ICD-10-CM | POA: Diagnosis not present

## 2023-04-19 DIAGNOSIS — R809 Proteinuria, unspecified: Secondary | ICD-10-CM | POA: Diagnosis not present

## 2023-04-19 DIAGNOSIS — E1140 Type 2 diabetes mellitus with diabetic neuropathy, unspecified: Secondary | ICD-10-CM | POA: Diagnosis not present

## 2023-04-20 ENCOUNTER — Other Ambulatory Visit: Payer: Self-pay | Admitting: Family

## 2023-05-11 ENCOUNTER — Ambulatory Visit: Payer: Medicare HMO | Admitting: Family Medicine

## 2023-06-15 ENCOUNTER — Other Ambulatory Visit: Payer: Self-pay | Admitting: General Surgery

## 2023-06-15 DIAGNOSIS — N6320 Unspecified lump in the left breast, unspecified quadrant: Secondary | ICD-10-CM

## 2023-06-19 ENCOUNTER — Other Ambulatory Visit: Payer: Self-pay | Admitting: Family

## 2023-06-19 DIAGNOSIS — I10 Essential (primary) hypertension: Secondary | ICD-10-CM

## 2023-07-15 ENCOUNTER — Other Ambulatory Visit: Payer: Self-pay | Admitting: Family

## 2023-07-17 NOTE — Telephone Encounter (Signed)
 30 day supply given, pt ntbs with PCP for further refills.

## 2023-07-17 NOTE — Telephone Encounter (Signed)
 Pt aware has appt in April

## 2023-07-20 ENCOUNTER — Other Ambulatory Visit: Payer: Self-pay | Admitting: Family

## 2023-08-04 ENCOUNTER — Ambulatory Visit: Payer: Medicare HMO | Admitting: Family

## 2023-08-08 ENCOUNTER — Other Ambulatory Visit: Payer: Self-pay | Admitting: General Surgery

## 2023-08-08 DIAGNOSIS — D369 Benign neoplasm, unspecified site: Secondary | ICD-10-CM

## 2023-08-08 DIAGNOSIS — N6320 Unspecified lump in the left breast, unspecified quadrant: Secondary | ICD-10-CM

## 2023-08-10 ENCOUNTER — Other Ambulatory Visit: Payer: Self-pay | Admitting: Family

## 2023-08-10 DIAGNOSIS — E1169 Type 2 diabetes mellitus with other specified complication: Secondary | ICD-10-CM

## 2023-08-10 DIAGNOSIS — M150 Primary generalized (osteo)arthritis: Secondary | ICD-10-CM

## 2023-08-10 DIAGNOSIS — G6289 Other specified polyneuropathies: Secondary | ICD-10-CM

## 2023-08-10 DIAGNOSIS — E785 Hyperlipidemia, unspecified: Secondary | ICD-10-CM

## 2023-08-14 ENCOUNTER — Encounter: Payer: Self-pay | Admitting: Family

## 2023-08-14 ENCOUNTER — Ambulatory Visit: Payer: Medicare HMO | Admitting: Family

## 2023-08-14 ENCOUNTER — Ambulatory Visit
Admission: RE | Admit: 2023-08-14 | Discharge: 2023-08-14 | Disposition: A | Discharge status: Home / Self Care | Payer: Medicare HMO | Source: Ambulatory Visit | Attending: General Surgery | Admitting: General Surgery

## 2023-08-14 VITALS — BP 119/78 | HR 82 | Temp 97.7°F | Ht 64.0 in | Wt 173.4 lb

## 2023-08-14 DIAGNOSIS — E039 Hypothyroidism, unspecified: Secondary | ICD-10-CM | POA: Diagnosis not present

## 2023-08-14 DIAGNOSIS — E1169 Type 2 diabetes mellitus with other specified complication: Secondary | ICD-10-CM | POA: Diagnosis not present

## 2023-08-14 DIAGNOSIS — E785 Hyperlipidemia, unspecified: Secondary | ICD-10-CM

## 2023-08-14 DIAGNOSIS — Z7984 Long term (current) use of oral hypoglycemic drugs: Secondary | ICD-10-CM

## 2023-08-14 DIAGNOSIS — I152 Hypertension secondary to endocrine disorders: Secondary | ICD-10-CM

## 2023-08-14 DIAGNOSIS — E663 Overweight: Secondary | ICD-10-CM

## 2023-08-14 DIAGNOSIS — E1159 Type 2 diabetes mellitus with other circulatory complications: Secondary | ICD-10-CM | POA: Diagnosis not present

## 2023-08-14 DIAGNOSIS — M150 Primary generalized (osteo)arthritis: Secondary | ICD-10-CM | POA: Diagnosis not present

## 2023-08-14 DIAGNOSIS — J302 Other seasonal allergic rhinitis: Secondary | ICD-10-CM

## 2023-08-14 DIAGNOSIS — G6289 Other specified polyneuropathies: Secondary | ICD-10-CM | POA: Diagnosis not present

## 2023-08-14 DIAGNOSIS — I10 Essential (primary) hypertension: Secondary | ICD-10-CM | POA: Diagnosis not present

## 2023-08-14 DIAGNOSIS — D242 Benign neoplasm of left breast: Secondary | ICD-10-CM | POA: Diagnosis not present

## 2023-08-14 DIAGNOSIS — D369 Benign neoplasm, unspecified site: Secondary | ICD-10-CM

## 2023-08-14 DIAGNOSIS — N6320 Unspecified lump in the left breast, unspecified quadrant: Secondary | ICD-10-CM

## 2023-08-14 DIAGNOSIS — E119 Type 2 diabetes mellitus without complications: Secondary | ICD-10-CM

## 2023-08-14 DIAGNOSIS — K219 Gastro-esophageal reflux disease without esophagitis: Secondary | ICD-10-CM | POA: Diagnosis not present

## 2023-08-14 MED ORDER — METFORMIN HCL 1000 MG PO TABS: 1000.0000 mg | ORAL_TABLET | Freq: Two times a day (BID) | ORAL | 1 refills | Status: DC

## 2023-08-14 MED ORDER — ROSUVASTATIN CALCIUM 20 MG PO TABS: 20.0000 mg | ORAL_TABLET | Freq: Every day | ORAL | 0 refills | Status: DC

## 2023-08-14 MED ORDER — MELOXICAM 15 MG PO TABS: 15.0000 mg | ORAL_TABLET | Freq: Every day | ORAL | 0 refills | Status: DC

## 2023-08-14 MED ORDER — HYDROCHLOROTHIAZIDE 25 MG PO TABS: 25.0000 mg | ORAL_TABLET | Freq: Every day | ORAL | 1 refills | Status: DC

## 2023-08-14 MED ORDER — PANTOPRAZOLE SODIUM 20 MG PO TBEC: 20.0000 mg | DELAYED_RELEASE_TABLET | Freq: Every day | ORAL | 0 refills | Status: DC

## 2023-08-14 MED ORDER — GABAPENTIN 300 MG PO CAPS
300.0000 mg | ORAL_CAPSULE | Freq: Every day | ORAL | 0 refills | Status: DC
Start: 2023-08-14 — End: 2024-02-12

## 2023-08-14 MED ORDER — LEVOTHYROXINE SODIUM 25 MCG PO TABS: 25.0000 ug | ORAL_TABLET | Freq: Every day | ORAL | 0 refills | Status: DC

## 2023-08-14 NOTE — Patient Instructions (Signed)
 Hypokalemia Hypokalemia means that the amount of potassium in the blood is lower than normal. Potassium is a mineral (electrolyte) that helps regulate the amount of fluid in the body. It also stimulates muscle tightening (contraction) and helps nerves work properly. Normally, most of the body's potassium is inside cells, and only a very small amount is in the blood. Because the amount in the blood is so small, minor changes to potassium levels in the blood can be life-threatening. What are the causes? This condition may be caused by: Antibiotic medicine. Diarrhea or vomiting. Taking too much of a medicine that helps you have a bowel movement (laxative) can cause diarrhea and lead to hypokalemia. Chronic kidney disease (CKD). Medicines that help the body get rid of excess fluid (diuretics). Eating disorders, such as anorexia or bulimia. Low magnesium levels in the body. Sweating a lot. What are the signs or symptoms? Symptoms of this condition include: Weakness. Constipation. Fatigue. Muscle cramps. Mental confusion. Skipped heartbeats or irregular heartbeat (palpitations). Tingling or numbness. How is this diagnosed? This condition is diagnosed with a blood test. How is this treated? This condition may be treated by: Taking potassium supplements. Adjusting the medicines that you take. Eating more foods that contain a lot of potassium. If your potassium level is very low, you may need to get potassium through an IV and be monitored in the hospital. Follow these instructions at home: Eating and drinking  Eat a healthy diet. A healthy diet includes fresh fruits and vegetables, whole grains, healthy fats, and lean proteins. If told, eat more foods that contain a lot of potassium. These include: Nuts, such as peanuts and pistachios. Seeds, such as sunflower seeds and pumpkin seeds. Peas, lentils, and lima beans. Whole grain and bran cereals and breads. Fresh fruits and vegetables,  such as apricots, avocado, bananas, cantaloupe, kiwi, oranges, tomatoes, asparagus, and potatoes. Juices, such as orange, tomato, and prune. Lean meats, including fish. Milk and milk products, such as yogurt. General instructions Take over-the-counter and prescription medicines only as told by your health care provider. This includes vitamins, natural food products, and supplements. Keep all follow-up visits. This is important. Contact a health care provider if: You have weakness that gets worse. You feel your heart pounding or racing. You vomit. You have diarrhea. You have diabetes and you have trouble keeping your blood sugar in your target range. Get help right away if: You have chest pain. You have shortness of breath. You have vomiting or diarrhea that lasts for more than 2 days. You faint. These symptoms may be an emergency. Get help right away. Call 911. Do not wait to see if the symptoms will go away. Do not drive yourself to the hospital. Summary Hypokalemia means that the amount of potassium in the blood is lower than normal. This condition is diagnosed with a blood test. Hypokalemia may be treated by taking potassium supplements, adjusting the medicines that you take, or eating more foods that are high in potassium. If your potassium level is very low, you may need to get potassium through an IV and be monitored in the hospital. This information is not intended to replace advice given to you by your health care provider. Make sure you discuss any questions you have with your health care provider. Document Revised: 12/31/2020 Document Reviewed: 12/31/2020 Elsevier Patient Education  2024 [employer].

## 2023-08-14 NOTE — Progress Notes (Signed)
 Subjective:    Patient ID: Alexandria Tucker, female    DOB: 1955-06-20, 68 y.o.   MRN: [id number]  Chief Complaint  Patient presents with   Medical Management of Chronic Issues   PT presents to the office today for chronic follow up.   She is complaining of bilateral burning and tingling pain of feet of 0-10 out 10 that is improving since taking gabapentin 300 mg nightly.   She is followed by Endocrinologists for DM.  She is followed by Dermatologists as needed.  Hypertension This is a chronic problem. The current episode started more than 1 year ago. The problem has been resolved since onset. The problem is controlled. Associated symptoms include malaise/fatigue. Pertinent negatives include no blurred vision, peripheral edema or shortness of breath. Risk factors for coronary artery disease include dyslipidemia, diabetes mellitus and obesity. The current treatment provides moderate improvement. Identifiable causes of hypertension include a thyroid problem.  Gastroesophageal Reflux She complains of belching and heartburn. She reports no hoarse voice or no nausea. This is a chronic problem. The current episode started more than 1 year ago. The problem occurs occasionally. The problem has been unchanged. The symptoms are aggravated by certain foods. Associated symptoms include fatigue. Risk factors include obesity. She has tried a PPI for the symptoms. The treatment provided moderate relief.  Hyperlipidemia This is a chronic problem. The current episode started more than 1 year ago. The problem is controlled. Recent lipid tests were reviewed and are normal. Exacerbating diseases include obesity. Pertinent negatives include no shortness of breath. Current antihyperlipidemic treatment includes statins. The current treatment provides moderate improvement of lipids. Risk factors for coronary artery disease include dyslipidemia, diabetes mellitus, hypertension, a sedentary lifestyle and post-menopausal.   Diabetes She presents for her follow-up diabetic visit. She has type 2 diabetes mellitus. Associated symptoms include fatigue and foot paresthesias. Pertinent negatives for diabetes include no blurred vision. Symptoms are stable. Diabetic complications include peripheral neuropathy. Risk factors for coronary artery disease include dyslipidemia, diabetes mellitus, hypertension, sedentary lifestyle and post-menopausal. She is following a generally healthy diet. Her overall blood glucose range is 110-130 mg/dl. Eye exam is current.  Thyroid Problem Presents for follow-up visit. Symptoms include fatigue. Patient reports no constipation, depressed mood or hoarse voice.      Review of Systems  Constitutional:  Positive for fatigue and malaise/fatigue.  HENT:  Negative for hoarse voice.   Eyes:  Negative for blurred vision.  Respiratory:  Negative for shortness of breath.   Gastrointestinal:  Positive for heartburn. Negative for constipation and nausea.  All other systems reviewed and are negative.   Family History  Problem Relation Age of Onset   Hypertension Mother    Heart disease Father    Heart disease Brother    Hypertension Brother    Hypertension Brother    Social History   Socioeconomic History   Marital status: Married    Spouse name: Not on file   Number of children: Not on file   Years of education: Not on file   Highest education level: Not on file  Occupational History   Not on file  Tobacco Use   Smoking status: Never   Smokeless tobacco: Never  Vaping Use   Vaping status: Never Used  Substance and Sexual Activity   Alcohol use: Yes    Alcohol/week: 0.0 standard drinks of alcohol    Comment: social   Drug use: No   Sexual activity: Not on file  Other Topics Concern  Not on file  Social History Narrative   Not on file   Social Drivers of Health   Financial Resource Strain: Low Risk  (02/27/2023)   Overall Financial Resource Strain (CARDIA)     Difficulty of Paying Living Expenses: Not hard at all  Food Insecurity: No Food Insecurity (02/27/2023)   Hunger Vital Sign    Worried About Running Out of Food in the Last Year: Never true    Ran Out of Food in the Last Year: Never true  Transportation Needs: No Transportation Needs (02/27/2023)   PRAPARE - Administrator, Civil Service (Medical): No    Lack of Transportation (Non-Medical): No  Physical Activity: Inactive (02/27/2023)   Exercise Vital Sign    Days of Exercise per Week: 0 days    Minutes of Exercise per Session: 0 min  Stress: No Stress Concern Present (02/27/2023)   [employer] of Occupational Health - Occupational Stress Questionnaire    Feeling of Stress : Only a little  Social Connections: Moderately Isolated (02/27/2023)   Social Connection and Isolation Panel [NHANES]    Frequency of Communication with Friends and Family: More than three times a week    Frequency of Social Gatherings with Friends and Family: More than three times a week    Attends Religious Services: Never    Database administrator or Organizations: No    Attends Banker Meetings: Never    Marital Status: Married       Objective:   Physical Exam Vitals reviewed.  Constitutional:      General: She is not in acute distress.    Appearance: She is well-developed. She is obese.  HENT:     Head: Normocephalic and atraumatic.     Right Ear: Tympanic membrane normal.     Left Ear: Tympanic membrane normal.  Eyes:     Pupils: Pupils are equal, round, and reactive to light.  Neck:     Thyroid: No thyromegaly.  Cardiovascular:     Rate and Rhythm: Normal rate and regular rhythm.     Heart sounds: Normal heart sounds. No murmur heard. Pulmonary:     Effort: Pulmonary effort is normal. No respiratory distress.     Breath sounds: Normal breath sounds. No wheezing.  Abdominal:     General: Bowel sounds are normal. There is no distension.     Palpations: Abdomen is  soft.     Tenderness: There is no abdominal tenderness.  Musculoskeletal:        General: No tenderness. Normal range of motion.     Cervical back: Normal range of motion and neck supple.  Skin:    General: Skin is warm and dry.  Neurological:     Mental Status: She is alert and oriented to person, place, and time.     Cranial Nerves: No cranial nerve deficit.     Deep Tendon Reflexes: Reflexes are normal and symmetric.  Psychiatric:        Behavior: Behavior normal.        Thought Content: Thought content normal.        Judgment: Judgment normal.        BP 119/78   Pulse 82   Temp 97.7 F (36.5 C) (Temporal)   Ht 5\' 4"  (1.626 m)   Wt 173 lb 6.4 oz (78.7 kg)   SpO2 95%   BMI 29.76 kg/m      Assessment & Plan:  Alexandria Tucker comes in today  with chief complaint of Medical Management of Chronic Issues   Diagnosis and orders addressed:  1. Other seasonal allergic rhinitis - CMP14+EGFR - CBC with Differential/Platelet  2. Other polyneuropathy - gabapentin (NEURONTIN) 300 MG capsule; Take 1 capsule (300 mg total) by mouth at bedtime.  Dispense: 90 capsule; Refill: 0 - CMP14+EGFR - CBC with Differential/Platelet  3. Hypertension associated with diabetes (HCC) - hydrochlorothiazide (HYDRODIURIL) 25 MG tablet; Take 1 tablet (25 mg total) by mouth daily.  Dispense: 90 tablet; Refill: 1 - CMP14+EGFR - CBC with Differential/Platelet  4. Essential hypertension - hydrochlorothiazide (HYDRODIURIL) 25 MG tablet; Take 1 tablet (25 mg total) by mouth daily.  Dispense: 90 tablet; Refill: 1 - CMP14+EGFR - CBC with Differential/Platelet  5. Primary osteoarthritis involving multiple joints - meloxicam (MOBIC) 15 MG tablet; Take 1 tablet (15 mg total) by mouth daily.  Dispense: 90 tablet; Refill: 0 - CMP14+EGFR - CBC with Differential/Platelet  6. Type 2 diabetes mellitus without complication, without long-term current use of insulin (HCC) (Primary) - metFORMIN (GLUCOPHAGE)  1000 MG tablet; Take 1 tablet (1,000 mg total) by mouth 2 (two) times daily with a meal.  Dispense: 180 tablet; Refill: 1 - CMP14+EGFR - CBC with Differential/Platelet  7. Hyperlipidemia associated with type 2 diabetes mellitus (HCC) - rosuvastatin (CRESTOR) 20 MG tablet; Take 1 tablet (20 mg total) by mouth daily.  Dispense: 90 tablet; Refill: 0 - CMP14+EGFR - CBC with Differential/Platelet  8. Gastroesophageal reflux disease, unspecified whether esophagitis present - pantoprazole (PROTONIX) 20 MG tablet; Take 1 tablet (20 mg total) by mouth daily.  Dispense: 90 tablet; Refill: 0 - CMP14+EGFR - CBC with Differential/Platelet  9. Overweight (BMI 25.0-29.9) - CMP14+EGFR - CBC with Differential/Platelet  10. Hypothyroidism, unspecified type - levothyroxine (SYNTHROID) 25 MCG tablet; Take 1 tablet (25 mcg total) by mouth daily before breakfast.  Dispense: 30 tablet; Refill: 0 - CMP14+EGFR - CBC with Differential/Platelet - TSH    Labs pending Will stop oral K+, increase K+ in diet  Keep follow up with specialists  Continue current medications  Health Maintenance reviewed Diet and exercise encouraged  Follow up plan: 6 months    Tommas Fragmin, FNP

## 2023-08-15 LAB — CBC WITH DIFFERENTIAL/PLATELET
Basophils Absolute: 0.1 10*3/uL (ref 0.0–0.2)
Basos: 1 %
EOS (ABSOLUTE): 0.3 10*3/uL (ref 0.0–0.4)
Eos: 3 %
Hematocrit: 41.2 % (ref 34.0–46.6)
Hemoglobin: 13.4 g/dL (ref 11.1–15.9)
Immature Grans (Abs): 0 10*3/uL (ref 0.0–0.1)
Immature Granulocytes: 0 %
Lymphocytes Absolute: 2 10*3/uL (ref 0.7–3.1)
Lymphs: 23 %
MCH: 29.1 pg (ref 26.6–33.0)
MCHC: 32.5 g/dL (ref 31.5–35.7)
MCV: 90 fL (ref 79–97)
Monocytes Absolute: 0.6 10*3/uL (ref 0.1–0.9)
Monocytes: 7 %
Neutrophils Absolute: 5.9 10*3/uL (ref 1.4–7.0)
Neutrophils: 66 %
Platelets: 262 10*3/uL (ref 150–450)
RBC: 4.6 x10E6/uL (ref 3.77–5.28)
RDW: 13.1 % (ref 11.7–15.4)
WBC: 8.9 10*3/uL (ref 3.4–10.8)

## 2023-08-15 LAB — CMP14+EGFR
ALT: 16 IU/L (ref 0–32)
AST: 20 IU/L (ref 0–40)
Albumin: 4.5 g/dL (ref 3.9–4.9)
Alkaline Phosphatase: 91 IU/L (ref 44–121)
BUN/Creatinine Ratio: 19 (ref 12–28)
BUN: 28 mg/dL — ABNORMAL HIGH (ref 8–27)
Bilirubin Total: 0.7 mg/dL (ref 0.0–1.2)
CO2: 26 mmol/L (ref 20–29)
Calcium: 10.7 mg/dL — ABNORMAL HIGH (ref 8.7–10.3)
Chloride: 100 mmol/L (ref 96–106)
Creatinine, Ser: 1.44 mg/dL — ABNORMAL HIGH (ref 0.57–1.00)
Globulin, Total: 2.5 g/dL (ref 1.5–4.5)
Glucose: 107 mg/dL — ABNORMAL HIGH (ref 70–99)
Potassium: 4.6 mmol/L (ref 3.5–5.2)
Sodium: 141 mmol/L (ref 134–144)
Total Protein: 7 g/dL (ref 6.0–8.5)
eGFR: 40 mL/min/{1.73_m2} — ABNORMAL LOW (ref >59)

## 2023-08-15 LAB — TSH: TSH: 2.79 u[IU]/mL (ref 0.450–4.500)

## 2023-10-01 ENCOUNTER — Other Ambulatory Visit: Payer: Self-pay | Admitting: Family

## 2023-10-01 DIAGNOSIS — E039 Hypothyroidism, unspecified: Secondary | ICD-10-CM

## 2023-11-08 DIAGNOSIS — E1165 Type 2 diabetes mellitus with hyperglycemia: Secondary | ICD-10-CM | POA: Diagnosis not present

## 2023-11-08 DIAGNOSIS — E119 Type 2 diabetes mellitus without complications: Secondary | ICD-10-CM

## 2023-11-15 DIAGNOSIS — R809 Proteinuria, unspecified: Secondary | ICD-10-CM | POA: Diagnosis not present

## 2023-11-15 DIAGNOSIS — E1165 Type 2 diabetes mellitus with hyperglycemia: Secondary | ICD-10-CM | POA: Diagnosis not present

## 2023-11-15 DIAGNOSIS — E1129 Type 2 diabetes mellitus with other diabetic kidney complication: Secondary | ICD-10-CM | POA: Diagnosis not present

## 2023-11-15 DIAGNOSIS — E1140 Type 2 diabetes mellitus with diabetic neuropathy, unspecified: Secondary | ICD-10-CM | POA: Diagnosis not present

## 2023-11-15 DIAGNOSIS — E785 Hyperlipidemia, unspecified: Secondary | ICD-10-CM | POA: Diagnosis not present

## 2023-11-15 DIAGNOSIS — E1169 Type 2 diabetes mellitus with other specified complication: Secondary | ICD-10-CM | POA: Diagnosis not present

## 2024-01-08 ENCOUNTER — Encounter: Payer: Self-pay | Admitting: Pharmacist

## 2024-01-08 ENCOUNTER — Other Ambulatory Visit (INDEPENDENT_AMBULATORY_CARE_PROVIDER_SITE_OTHER): Admitting: Pharmacist

## 2024-01-08 DIAGNOSIS — E119 Type 2 diabetes mellitus without complications: Secondary | ICD-10-CM

## 2024-01-08 NOTE — Progress Notes (Signed)
 Pharmacy Quality Measure Review  This patient is appearing on a report for being at risk of failing the Glycemic Status Assessment in Diabetes measure this calendar year.   Last documented A1c 6.2% on 11/08/2023.   A1c is controlled, no action needed at this time.   [facility], PharmD, [facility] Clinical Pharmacist [phone]

## 2024-02-13 ENCOUNTER — Ambulatory Visit (INDEPENDENT_AMBULATORY_CARE_PROVIDER_SITE_OTHER): Admitting: Family

## 2024-02-13 ENCOUNTER — Encounter: Payer: Self-pay | Admitting: Family

## 2024-02-13 VITALS — BP 119/68 | HR 73 | Temp 97.2°F | Wt 151.0 lb

## 2024-02-13 DIAGNOSIS — R112 Nausea with vomiting, unspecified: Secondary | ICD-10-CM | POA: Diagnosis not present

## 2024-02-13 DIAGNOSIS — E039 Hypothyroidism, unspecified: Secondary | ICD-10-CM | POA: Insufficient documentation

## 2024-02-13 DIAGNOSIS — Z7985 Long-term (current) use of injectable non-insulin antidiabetic drugs: Secondary | ICD-10-CM

## 2024-02-13 DIAGNOSIS — Z0000 Encounter for general adult medical examination without abnormal findings: Secondary | ICD-10-CM | POA: Diagnosis not present

## 2024-02-13 DIAGNOSIS — E1159 Type 2 diabetes mellitus with other circulatory complications: Secondary | ICD-10-CM

## 2024-02-13 DIAGNOSIS — Z0001 Encounter for general adult medical examination with abnormal findings: Secondary | ICD-10-CM | POA: Diagnosis not present

## 2024-02-13 DIAGNOSIS — R634 Abnormal weight loss: Secondary | ICD-10-CM

## 2024-02-13 DIAGNOSIS — I10 Essential (primary) hypertension: Secondary | ICD-10-CM

## 2024-02-13 DIAGNOSIS — E119 Type 2 diabetes mellitus without complications: Secondary | ICD-10-CM

## 2024-02-13 DIAGNOSIS — E1169 Type 2 diabetes mellitus with other specified complication: Secondary | ICD-10-CM | POA: Diagnosis not present

## 2024-02-13 DIAGNOSIS — M150 Primary generalized (osteo)arthritis: Secondary | ICD-10-CM | POA: Diagnosis not present

## 2024-02-13 DIAGNOSIS — G6289 Other specified polyneuropathies: Secondary | ICD-10-CM

## 2024-02-13 DIAGNOSIS — K824 Cholesterolosis of gallbladder: Secondary | ICD-10-CM

## 2024-02-13 DIAGNOSIS — K219 Gastro-esophageal reflux disease without esophagitis: Secondary | ICD-10-CM | POA: Diagnosis not present

## 2024-02-13 DIAGNOSIS — I152 Hypertension secondary to endocrine disorders: Secondary | ICD-10-CM

## 2024-02-13 DIAGNOSIS — E663 Overweight: Secondary | ICD-10-CM

## 2024-02-13 LAB — BAYER DCA HB A1C WAIVED: HB A1C (BAYER DCA - WAIVED): 5.4 % (ref 4.8–5.6)

## 2024-02-13 MED ORDER — ONDANSETRON HCL 4 MG PO TABS: 4.0000 mg | ORAL_TABLET | Freq: Three times a day (TID) | ORAL | 0 refills | Status: DC | PRN

## 2024-02-13 MED ORDER — ROSUVASTATIN CALCIUM 20 MG PO TABS: 20.0000 mg | ORAL_TABLET | Freq: Every day | ORAL | 0 refills | Status: DC

## 2024-02-13 MED ORDER — GABAPENTIN 300 MG PO CAPS: 300.0000 mg | ORAL_CAPSULE | Freq: Every day | ORAL | 0 refills | Status: AC

## 2024-02-13 MED ORDER — PANTOPRAZOLE SODIUM 20 MG PO TBEC: 20.0000 mg | DELAYED_RELEASE_TABLET | Freq: Every day | ORAL | 0 refills | Status: DC

## 2024-02-13 NOTE — Progress Notes (Signed)
 Subjective:    Patient ID: Alexandria Tucker, female    DOB: [birth year], 68 y.o.   MRN: [id number]  Chief Complaint  Patient presents with   Medical Management of Chronic Issues    Dry heaving and throwing up. Extreme wt loss   PT presents to the office today for CPE.   She is complaining of bilateral burning and tingling pain of feet of 6 out 10 that is improving since taking gabapentin  300 mg nightly.   She is followed by Endocrinologists for DM.  She is followed by Dermatologists as needed.   She reports nausea, vomiting that started two weeks ago. She has stopped all her medications for the last 6 days.  She held her Ozempic  0.25 mg this week. Her starting weight was 249 lb.      02/13/2024    7:59 AM 08/14/2023    8:04 AM 02/27/2023    9:02 AM  Last 3 Weights  Weight (lbs) 151 lb 173 lb 6.4 oz 185 lb  Weight (kg) 68.493 kg 78.654 kg 83.915 kg     Hypertension This is a chronic problem. The current episode started more than 1 year ago. The problem has been resolved since onset. The problem is controlled. Associated symptoms include malaise/fatigue. Pertinent negatives include no blurred vision, peripheral edema or shortness of breath. Risk factors for coronary artery disease include dyslipidemia, diabetes mellitus and obesity. The current treatment provides moderate improvement. Identifiable causes of hypertension include a thyroid  problem.  Gastroesophageal Reflux She complains of belching and heartburn. She reports no hoarse voice or no nausea. This is a chronic problem. The current episode started more than 1 year ago. The problem occurs occasionally. The problem has been unchanged. The symptoms are aggravated by certain foods. Associated symptoms include fatigue and weight loss. Risk factors include obesity. She has tried a PPI for the symptoms. The treatment provided moderate relief.  Hyperlipidemia This is a chronic problem. The current episode started more than 1 year ago.  The problem is controlled. Recent lipid tests were reviewed and are normal. Exacerbating diseases include obesity. Pertinent negatives include no shortness of breath. Current antihyperlipidemic treatment includes statins. The current treatment provides moderate improvement of lipids. Risk factors for coronary artery disease include dyslipidemia, diabetes mellitus, hypertension, a sedentary lifestyle and post-menopausal.  Diabetes She presents for her follow-up diabetic visit. She has type 2 diabetes mellitus. Associated symptoms include fatigue, foot paresthesias and weight loss. Pertinent negatives for diabetes include no blurred vision. Symptoms are stable. Diabetic complications include peripheral neuropathy. Risk factors for coronary artery disease include dyslipidemia, diabetes mellitus, hypertension, sedentary lifestyle and post-menopausal. She is following a generally healthy diet. Her overall blood glucose range is 110-130 mg/dl. Eye exam is current.  Thyroid  Problem Presents for follow-up visit. Symptoms include dry skin, fatigue and weight loss. Patient reports no constipation, depressed mood, diarrhea, hoarse voice or weight gain. The symptoms have been stable.      Review of Systems  Constitutional:  Positive for fatigue, malaise/fatigue and weight loss. Negative for weight gain.  HENT:  Negative for hoarse voice.   Eyes:  Negative for blurred vision.  Respiratory:  Negative for shortness of breath.   Gastrointestinal:  Positive for heartburn. Negative for constipation, diarrhea and nausea.  All other systems reviewed and are negative.   Family History  Problem Relation Age of Onset   Hypertension Mother    Heart disease Father    Heart disease Brother    Hypertension Brother  Hypertension Brother    Social History   Socioeconomic History   Marital status: Married    Spouse name: Not on file   Number of children: Not on file   Years of education: Not on file   Highest  education level: Not on file  Occupational History   Not on file  Tobacco Use   Smoking status: Never   Smokeless tobacco: Never  Vaping Use   Vaping status: Never Used  Substance and Sexual Activity   Alcohol use: Yes    Alcohol/week: 0.0 standard drinks of alcohol    Comment: social   Drug use: No   Sexual activity: Not on file  Other Topics Concern   Not on file  Social History Narrative   Not on file   Social Drivers of Health   Financial Resource Strain: Low Risk  (02/27/2023)   Overall Financial Resource Strain (CARDIA)    Difficulty of Paying Living Expenses: Not hard at all  Food Insecurity: No Food Insecurity (02/27/2023)   Hunger Vital Sign    Worried About Running Out of Food in the Last Year: Never true    Ran Out of Food in the Last Year: Never true  Transportation Needs: No Transportation Needs (02/27/2023)   PRAPARE - Administrator, Civil Service (Medical): No    Lack of Transportation (Non-Medical): No  Physical Activity: Inactive (02/27/2023)   Exercise Vital Sign    Days of Exercise per Week: 0 days    Minutes of Exercise per Session: 0 min  Stress: No Stress Concern Present (02/27/2023)   [employer] of Occupational Health - Occupational Stress Questionnaire    Feeling of Stress : Only a little  Social Connections: Moderately Isolated (02/27/2023)   Social Connection and Isolation Panel    Frequency of Communication with Friends and Family: More than three times a week    Frequency of Social Gatherings with Friends and Family: More than three times a week    Attends Religious Services: Never    Database administrator or Organizations: No    Attends Banker Meetings: Never    Marital Status: Married       Objective:   Physical Exam Vitals reviewed.  Constitutional:      General: She is not in acute distress.    Appearance: She is well-developed. She is obese.  HENT:     Head: Normocephalic and atraumatic.      Right Ear: Tympanic membrane normal.     Left Ear: Tympanic membrane normal.  Eyes:     Pupils: Pupils are equal, round, and reactive to light.  Neck:     Thyroid : No thyromegaly.  Cardiovascular:     Rate and Rhythm: Normal rate and regular rhythm.     Heart sounds: Normal heart sounds. No murmur heard. Pulmonary:     Effort: Pulmonary effort is normal. No respiratory distress.     Breath sounds: Normal breath sounds. No wheezing.  Abdominal:     General: Bowel sounds are normal. There is no distension.     Palpations: Abdomen is soft.     Tenderness: There is no abdominal tenderness.  Musculoskeletal:        General: No tenderness. Normal range of motion.     Cervical back: Normal range of motion and neck supple.  Skin:    General: Skin is warm and dry.  Neurological:     Mental Status: She is alert and oriented to person, place,  and time.     Cranial Nerves: No cranial nerve deficit.     Deep Tendon Reflexes: Reflexes are normal and symmetric.  Psychiatric:        Behavior: Behavior normal.        Thought Content: Thought content normal.        Judgment: Judgment normal.        BP 119/68   Pulse 73   Temp (!) 97.2 F (36.2 C)   Wt 151 lb (68.5 kg)   SpO2 100%   BMI 25.92 kg/m      Assessment & Plan:  Alexandria Tucker comes in today with chief complaint of Medical Management of Chronic Issues (Dry heaving and throwing up. Extreme wt loss)   Diagnosis and orders addressed:  1. Other polyneuropathy - gabapentin  (NEURONTIN ) 300 MG capsule; Take 1 capsule (300 mg total) by mouth at bedtime.  Dispense: 90 capsule; Refill: 0 - CBC with Differential/Platelet - CMP14+EGFR  2. Hypertension associated with diabetes (HCC) - CBC with Differential/Platelet - CMP14+EGFR  3. Essential hypertension - CBC with Differential/Platelet - CMP14+EGFR  4. Primary osteoarthritis involving multiple joints - CBC with Differential/Platelet - CMP14+EGFR  5. Type 2 diabetes  mellitus without complication, without long-term current use of insulin (HCC) Continue to hold Metformin  and Ozempic   Low carb diet  - Bayer DCA Hb A1c Waived - CBC with Differential/Platelet - CMP14+EGFR - Vitamin B12 - Microalbumin / creatinine urine ratio  6. Gastroesophageal reflux disease, unspecified whether esophagitis present Restart Protonix  20 mg  -Dash diet information given -Exercise encouraged - Stress Management  -Continue current meds - pantoprazole  (PROTONIX ) 20 MG tablet; Take 1 tablet (20 mg total) by mouth daily.  Dispense: 90 tablet; Refill: 0 - CBC with Differential/Platelet - CMP14+EGFR  7. Hyperlipidemia associated with type 2 diabetes mellitus (HCC) - rosuvastatin  (CRESTOR ) 20 MG tablet; Take 1 tablet (20 mg total) by mouth daily.  Dispense: 90 tablet; Refill: 0 - CBC with Differential/Platelet - Lipid panel - CMP14+EGFR  8. Annual physical exam (Primary) - Bayer DCA Hb A1c Waived - CBC with Differential/Platelet - Lipid panel - CMP14+EGFR - Vitamin B12  9. Overweight (BMI 25.0-29.9) - CBC with Differential/Platelet - CMP14+EGFR  10. Weight loss - CBC with Differential/Platelet - CMP14+EGFR  11. Nausea and vomiting, unspecified vomiting type - CBC with Differential/Platelet - CMP14+EGFR - US  Abdomen Limited RUQ (LIVER/GB); Future - ondansetron  (ZOFRAN ) 4 MG tablet; Take 1 tablet (4 mg total) by mouth every 8 (eight) hours as needed for nausea or vomiting.  Dispense: 90 tablet; Refill: 0  12. Gallbladder polyp - CBC with Differential/Platelet - CMP14+EGFR - US  Abdomen Limited RUQ (LIVER/GB); Future  13. Hypothyroidism, unspecified type - CBC with Differential/Platelet - CMP14+EGFR - TSH    Labs pending Given weight loss, nausea, and vomiting will stop Ozempic  and Metformin   US  of RUQ given N&V If symptoms continue will need referral to GI Health Maintenance reviewed Diet and exercise encouraged  Follow up plan: 6 months     Bari Learn, FNP

## 2024-02-13 NOTE — Patient Instructions (Signed)
 Nausea and Vomiting, Adult Nausea is the feeling that you have an upset stomach or that you are about to vomit. As nausea gets worse, it can lead to vomiting. Vomiting is when stomach contents forcefully come out of your mouth as a result of nausea. Vomiting can make you feel weak and cause you to become dehydrated. Dehydration can make you feel tired and thirsty, cause you to have a dry mouth, and decrease how often you urinate. Older adults and people with other diseases or a weak disease-fighting system (immune system) are at higher risk for dehydration. It is important to treat your nausea and vomiting as told by your health care provider. Follow these instructions at home: Watch your symptoms for any changes. Tell your health care provider about them. Eating and drinking     Take an oral rehydration solution (ORS). This is a drink that is sold at pharmacies and retail stores. Drink clear fluids slowly and in small amounts as you are able. Clear fluids include water, ice chips, low-calorie sports drinks, and fruit juice that has water added (diluted fruit juice). Eat bland, easy-to-digest foods in small amounts as you are able. These foods include bananas, applesauce, rice, lean meats, toast, and crackers. Avoid fluids that contain a lot of sugar or caffeine, such as energy drinks, sports drinks, and soda. Avoid alcohol. Avoid spicy or fatty foods. General instructions Take over-the-counter and prescription medicines only as told by your health care provider. Drink enough fluid to keep your urine pale yellow. Wash your hands often using soap and water for at least 20 seconds. If soap and water are not available, use hand sanitizer. Make sure that everyone in your household washes their hands well and often. Rest at home while you recover. Watch your condition for any changes. Take slow and deep breaths when you feel nauseous. Keep all follow-up visits. This is important. Contact a health  care provider if: Your symptoms get worse. You have new symptoms. You have a fever. You cannot drink fluids without vomiting. Your nausea does not go away after 2 days. You feel light-headed or dizzy. You have a headache. You have muscle cramps. You have a rash. You have pain while urinating. Get help right away if: You have pain in your chest, neck, arm, or jaw. You feel extremely weak or you faint. You have persistent vomiting. You have vomit that is bright red or looks like black coffee grounds. You have bloody or black stools (feces) or stools that look like tar. You have a severe headache, a stiff neck, or both. You have severe pain, cramping, or bloating in your abdomen. You have difficulty breathing, or you are breathing very quickly. Your heart is beating very quickly. Your skin feels cold and clammy. You feel confused. You have signs of dehydration, such as: Dark urine, very little urine, or no urine. Cracked lips. Dry mouth. Sunken eyes. Sleepiness. Weakness. These symptoms may be an emergency. Get help right away. Call 911. Do not wait to see if the symptoms will go away. Do not drive yourself to the hospital. Summary Nausea is the feeling that you have an upset stomach or that you are about to vomit. As nausea gets worse, it can lead to vomiting. Vomiting can make you feel weak and cause you to become dehydrated. Follow instructions from your health care provider about eating and drinking to prevent dehydration. Take over-the-counter and prescription medicines only as told by your health care provider. Contact your health care  provider if your symptoms get worse, or you have new symptoms. Keep all follow-up visits. This is important. This information is not intended to replace advice given to you by your health care provider. Make sure you discuss any questions you have with your health care provider. Document Revised: 10/23/2020 Document Reviewed:  10/23/2020 Elsevier Patient Education  2024 [employer].

## 2024-02-14 LAB — CMP14+EGFR
ALT: 18 IU/L (ref 0–32)
AST: 21 IU/L (ref 0–40)
Albumin: 4.6 g/dL (ref 3.9–4.9)
Alkaline Phosphatase: 66 IU/L (ref 49–135)
BUN/Creatinine Ratio: 14 (ref 12–28)
BUN: 27 mg/dL (ref 8–27)
Bilirubin Total: 0.9 mg/dL (ref 0.0–1.2)
CO2: 24 mmol/L (ref 20–29)
Calcium: 10.2 mg/dL (ref 8.7–10.3)
Chloride: 102 mmol/L (ref 96–106)
Creatinine, Ser: 1.92 mg/dL — ABNORMAL HIGH (ref 0.57–1.00)
Globulin, Total: 2.5 g/dL (ref 1.5–4.5)
Glucose: 126 mg/dL — ABNORMAL HIGH (ref 70–99)
Potassium: 3.6 mmol/L (ref 3.5–5.2)
Sodium: 143 mmol/L (ref 134–144)
Total Protein: 7.1 g/dL (ref 6.0–8.5)
eGFR: 28 mL/min/1.73 — ABNORMAL LOW (ref >59)

## 2024-02-14 LAB — CBC WITH DIFFERENTIAL/PLATELET
Basophils Absolute: 0.1 x10E3/uL (ref 0.0–0.2)
Basos: 1 %
EOS (ABSOLUTE): 0.2 x10E3/uL (ref 0.0–0.4)
Eos: 2 %
Hematocrit: 35.8 % (ref 34.0–46.6)
Hemoglobin: 11.3 g/dL (ref 11.1–15.9)
Immature Grans (Abs): 0 x10E3/uL (ref 0.0–0.1)
Immature Granulocytes: 0 %
Lymphocytes Absolute: 2.3 x10E3/uL (ref 0.7–3.1)
Lymphs: 24 %
MCH: 29.7 pg (ref 26.6–33.0)
MCHC: 31.6 g/dL (ref 31.5–35.7)
MCV: 94 fL (ref 79–97)
Monocytes Absolute: 0.7 x10E3/uL (ref 0.1–0.9)
Monocytes: 7 %
Neutrophils Absolute: 6.4 x10E3/uL (ref 1.4–7.0)
Neutrophils: 66 %
Platelets: 286 x10E3/uL (ref 150–450)
RBC: 3.81 x10E6/uL (ref 3.77–5.28)
RDW: 13.6 % (ref 11.7–15.4)
WBC: 9.6 x10E3/uL (ref 3.4–10.8)

## 2024-02-14 LAB — LIPID PANEL
Chol/HDL Ratio: 3.9 ratio (ref 0.0–4.4)
Cholesterol, Total: 135 mg/dL (ref 100–199)
HDL: 35 mg/dL — ABNORMAL LOW (ref >39)
LDL Chol Calc (NIH): 74 mg/dL (ref 0–99)
Triglycerides: 147 mg/dL (ref 0–149)
VLDL Cholesterol Cal: 26 mg/dL (ref 5–40)

## 2024-02-14 LAB — MICROALBUMIN / CREATININE URINE RATIO
Creatinine, Urine: 136.3 mg/dL
Microalb/Creat Ratio: 143 mg/g{creat} — ABNORMAL HIGH (ref 0–29)
Microalbumin, Urine: 194.3 ug/mL

## 2024-02-14 LAB — VITAMIN B12: Vitamin B-12: 344 pg/mL (ref 232–1245)

## 2024-02-14 LAB — TSH: TSH: 2.71 u[IU]/mL (ref 0.450–4.500)

## 2024-02-15 ENCOUNTER — Ambulatory Visit: Payer: Self-pay | Admitting: Family

## 2024-02-15 DIAGNOSIS — R7989 Other specified abnormal findings of blood chemistry: Secondary | ICD-10-CM

## 2024-02-15 DIAGNOSIS — K8020 Calculus of gallbladder without cholecystitis without obstruction: Secondary | ICD-10-CM

## 2024-02-15 DIAGNOSIS — K828 Other specified diseases of gallbladder: Secondary | ICD-10-CM

## 2024-02-20 ENCOUNTER — Encounter: Payer: Self-pay | Admitting: Family

## 2024-02-20 ENCOUNTER — Ambulatory Visit (INDEPENDENT_AMBULATORY_CARE_PROVIDER_SITE_OTHER): Admitting: Family

## 2024-02-20 ENCOUNTER — Other Ambulatory Visit

## 2024-02-20 ENCOUNTER — Encounter: Payer: Self-pay | Admitting: *Deleted

## 2024-02-20 ENCOUNTER — Ambulatory Visit (INDEPENDENT_AMBULATORY_CARE_PROVIDER_SITE_OTHER)

## 2024-02-20 ENCOUNTER — Ambulatory Visit: Payer: Self-pay | Admitting: Family

## 2024-02-20 VITALS — BP 128/72 | HR 78 | Temp 97.6°F | Ht 64.0 in | Wt 153.2 lb

## 2024-02-20 DIAGNOSIS — M7989 Other specified soft tissue disorders: Secondary | ICD-10-CM | POA: Diagnosis not present

## 2024-02-20 DIAGNOSIS — M25571 Pain in right ankle and joints of right foot: Secondary | ICD-10-CM

## 2024-02-20 DIAGNOSIS — R7989 Other specified abnormal findings of blood chemistry: Secondary | ICD-10-CM | POA: Diagnosis not present

## 2024-02-20 DIAGNOSIS — M25471 Effusion, right ankle: Secondary | ICD-10-CM | POA: Diagnosis not present

## 2024-02-20 NOTE — Patient Instructions (Signed)
Ankle Pain The ankle joint helps you stand on your leg and allows you to move around. Ankle pain can happen on either side or the back of the ankle. You may have pain in one ankle or both ankles. Ankle pain may be sharp and burning or dull and aching. There may be tenderness, stiffness, redness, or warmth around the ankle. Many things can cause ankle pain. These include an injury to the area and overuse of your ankle. Follow these instructions at home: Activity Rest your ankle as told by your health care provider. Avoid doing things that cause ankle pain. Do not use the injured limb to support your body weight until your provider says that you can. Use crutches as told by your provider. Ask your provider when it is safe to drive if you have a brace on your ankle. Do exercises as told by your provider. If you have a removable brace: Wear the brace as told by your provider. Remove it only as told by your provider. Check the skin around the brace every day. Tell your provider about any concerns. Loosen the brace if your toes tingle, become numb, or turn cold and blue. Keep the brace clean. If the brace is not waterproof: Do not let it get wet. Cover it with a watertight covering when you take a bath or shower. If you have an elastic bandage:  Remove it when you take a bath or a shower. Try not to move your ankle much. Wiggle your toes from time to time. This helps to prevent swelling. Adjust the bandage if it feels too tight. Loosen the bandage if your foot tingles, becomes numb, or turns cold and blue. Managing pain, stiffness, and swelling  If told, put ice on the painful area. If you have a removable brace or elastic bandage, remove it as told by your provider. Put ice in a plastic bag. Place a towel between your skin and the bag. Leave the ice on for 20 minutes, 2-3 times a day. If your skin turns bright red, remove the ice right away to prevent skin damage. The risk of damage is  higher if you cannot feel pain, heat, or cold. Move your toes often to reduce stiffness and swelling. Raise (elevate) your ankle above the level of your heart while you are sitting or lying down. General instructions Take over-the-counter and prescription medicines only as told by your provider. To help you and your provider, write down: How often you have ankle pain. Where the pain is. What the pain feels like. If you are told to wear a certain shoe or insole, make sure you wear it the right way and for as long as you are told. Contact a health care provider if: Your pain gets worse. Your pain does not get better with medicine. You have a fever or chills. You have more trouble walking. You have new symptoms. Your foot, leg, toes, or ankle tingles, becomes numb or swollen, or turns cold and blue. This information is not intended to replace advice given to you by your health care provider. Make sure you discuss any questions you have with your health care provider. Document Revised: 02/09/2022 Document Reviewed: 02/09/2022 Elsevier Patient Education  2024 [employer].

## 2024-02-20 NOTE — Progress Notes (Signed)
 Subjective:    Patient ID: [name], female    DOB: 1956/02/07, 68 y.o.   MRN: [id number]  Chief Complaint  Patient presents with   Ankle Pain    Right    Pt presents to the office today with right ankle pain that started after she woke up Friday AM. Reports her pain is worse when she stands or weight bears on her ankle. Moderate swelling. Denies any erythemas or warmth.  Ankle Pain  The incident occurred 3 to 5 days ago. There was no injury mechanism. The pain is present in the right ankle. The quality of the pain is described as aching. Pain scale: 0 when sitting, 10 when walking. The pain is moderate. The pain has been Intermittent since onset. Associated symptoms include an inability to bear weight. The symptoms are aggravated by movement and weight bearing. She has tried non-weight bearing and acetaminophen  for the symptoms. The treatment provided mild relief.      Review of Systems  All other systems reviewed and are negative.   Social History   Socioeconomic History   Marital status: Married    Spouse name: Not on file   Number of children: Not on file   Years of education: Not on file   Highest education level: Not on file  Occupational History   Not on file  Tobacco Use   Smoking status: Never   Smokeless tobacco: Never  Vaping Use   Vaping status: Never Used  Substance and Sexual Activity   Alcohol use: Yes    Alcohol/week: 0.0 standard drinks of alcohol    Comment: social   Drug use: No   Sexual activity: Not on file  Other Topics Concern   Not on file  Social History Narrative   Not on file   Social Drivers of Health   Financial Resource Strain: Low Risk  (02/27/2023)   Overall Financial Resource Strain (CARDIA)    Difficulty of Paying Living Expenses: Not hard at all  Food Insecurity: No Food Insecurity (02/27/2023)   Hunger Vital Sign    Worried About Running Out of Food in the Last Year: Never true    Ran Out of Food in the Last Year:  Never true  Transportation Needs: No Transportation Needs (02/27/2023)   PRAPARE - Administrator, Civil Service (Medical): No    Lack of Transportation (Non-Medical): No  Physical Activity: Inactive (02/27/2023)   Exercise Vital Sign    Days of Exercise per Week: 0 days    Minutes of Exercise per Session: 0 min  Stress: No Stress Concern Present (02/27/2023)   [employer] of Occupational Health - Occupational Stress Questionnaire    Feeling of Stress : Only a little  Social Connections: Moderately Isolated (02/27/2023)   Social Connection and Isolation Panel    Frequency of Communication with Friends and Family: More than three times a week    Frequency of Social Gatherings with Friends and Family: More than three times a week    Attends Religious Services: Never    Database administrator or Organizations: No    Attends Engineer, structural: Never    Marital Status: Married   Family History  Problem Relation Age of Onset   Hypertension Mother    Heart disease Father    Heart disease Brother    Hypertension Brother    Hypertension Brother         Objective:   Physical Exam Vitals reviewed.  Constitutional:      General: She is not in acute distress.    Appearance: She is well-developed.  HENT:     Head: Normocephalic and atraumatic.  Eyes:     Pupils: Pupils are equal, round, and reactive to light.  Neck:     Thyroid : No thyromegaly.  Cardiovascular:     Rate and Rhythm: Normal rate and regular rhythm.     Heart sounds: Normal heart sounds. No murmur heard. Pulmonary:     Effort: Pulmonary effort is normal. No respiratory distress.     Breath sounds: Normal breath sounds. No wheezing.  Abdominal:     General: Bowel sounds are normal. There is no distension.     Palpations: Abdomen is soft.     Tenderness: There is no abdominal tenderness.  Musculoskeletal:        General: Tenderness present.     Cervical back: Normal range of motion  and neck supple.     Comments: Right ankle swelling, pain with flexion and extension  Skin:    General: Skin is warm and dry.  Neurological:     Mental Status: She is alert and oriented to person, place, and time.     Cranial Nerves: No cranial nerve deficit.     Deep Tendon Reflexes: Reflexes are normal and symmetric.  Psychiatric:        Behavior: Behavior normal.        Thought Content: Thought content normal.        Judgment: Judgment normal.       BP 128/72   Pulse 78   Temp 97.6 F (36.4 C) (Temporal)   Ht 5' 4 (1.626 m)   Wt 153 lb 3.2 oz (69.5 kg)   BMI 26.30 kg/m      Assessment & Plan:  Kissa A Ploch comes in today with chief complaint of Ankle Pain (Right )   Diagnosis and orders addressed:  1. Acute right ankle pain (Primary)  - DG Ankle Complete Right; Future - Uric acid  2. Right ankle swelling - DG Ankle Complete Right; Future - Uric acid  3. Elevated serum creatinine - CMP14+EGFR   Rest Ice  Compression Ankle brace  Tylenol  as needed If pain worsen or does not improve may need MRI, x-ray negative     Bari Learn, FNP

## 2024-02-21 LAB — CMP14+EGFR
ALT: 10 IU/L (ref 0–32)
AST: 14 IU/L (ref 0–40)
Albumin: 4 g/dL (ref 3.9–4.9)
Alkaline Phosphatase: 68 IU/L (ref 49–135)
BUN/Creatinine Ratio: 9 — AB (ref 12–28)
BUN: 15 mg/dL (ref 8–27)
Bilirubin Total: 0.7 mg/dL (ref 0.0–1.2)
CO2: 25 mmol/L (ref 20–29)
Calcium: 9.8 mg/dL (ref 8.7–10.3)
Chloride: 102 mmol/L (ref 96–106)
Creatinine, Ser: 1.69 mg/dL — AB (ref 0.57–1.00)
Globulin, Total: 2.2 g/dL (ref 1.5–4.5)
Glucose: 150 mg/dL — AB (ref 70–99)
Potassium: 3.5 mmol/L (ref 3.5–5.2)
Sodium: 142 mmol/L (ref 134–144)
Total Protein: 6.2 g/dL (ref 6.0–8.5)
eGFR: 33 mL/min/1.73 — AB (ref >59)

## 2024-02-22 LAB — SPECIMEN STATUS REPORT

## 2024-02-22 LAB — URIC ACID: Uric Acid: 2.5 mg/dL — AB (ref 3.0–7.2)

## 2024-02-26 ENCOUNTER — Ambulatory Visit (HOSPITAL_COMMUNITY)
Admission: RE | Admit: 2024-02-26 | Discharge: 2024-02-26 | Disposition: A | Discharge status: Home / Self Care | Source: Ambulatory Visit | Attending: Family | Admitting: Family

## 2024-02-26 DIAGNOSIS — R112 Nausea with vomiting, unspecified: Secondary | ICD-10-CM | POA: Insufficient documentation

## 2024-02-26 DIAGNOSIS — K8020 Calculus of gallbladder without cholecystitis without obstruction: Secondary | ICD-10-CM | POA: Diagnosis not present

## 2024-02-26 DIAGNOSIS — K824 Cholesterolosis of gallbladder: Secondary | ICD-10-CM | POA: Insufficient documentation

## 2024-02-26 DIAGNOSIS — K838 Other specified diseases of biliary tract: Secondary | ICD-10-CM | POA: Diagnosis not present

## 2024-02-27 ENCOUNTER — Telehealth: Payer: Self-pay | Admitting: Family

## 2024-02-27 NOTE — Telephone Encounter (Signed)
 Pt informed that US  has not resulted yet. LS

## 2024-02-27 NOTE — Telephone Encounter (Signed)
 Wants results from US  done 10-27.

## 2024-02-28 ENCOUNTER — Ambulatory Visit: Payer: Medicare HMO

## 2024-02-28 VITALS — BP 128/72 | HR 78 | Ht 64.0 in | Wt 153.0 lb

## 2024-02-28 DIAGNOSIS — Z0000 Encounter for general adult medical examination without abnormal findings: Secondary | ICD-10-CM | POA: Diagnosis not present

## 2024-02-28 NOTE — Patient Instructions (Signed)
 Ms. Kaczorowski,  Thank you for taking the time for your Medicare Wellness Visit. I appreciate your continued commitment to your health goals. Please review the care plan we discussed, and feel free to reach out if I can assist you further.  Medicare recommends these wellness visits once per year to help you and your care team stay ahead of potential health issues. These visits are designed to focus on prevention, allowing your provider to concentrate on managing your acute and chronic conditions during your regular appointments.  Please note that Annual Wellness Visits do not include a physical exam. Some assessments may be limited, especially if the visit was conducted virtually. If needed, we may recommend a separate in-person follow-up with your provider.  Ongoing Care Seeing your primary care provider every 3 to 6 months helps us  monitor your health and provide consistent, personalized care.  Referrals If a referral was made during today's visit and you haven't received any updates within two weeks, please contact the referred provider directly to check on the status.  Recommended Screenings:  Health Maintenance  Topic Date Due   Medicare Annual Wellness Visit  02/27/2024   Complete foot exam   02/03/2024   COVID-19 Vaccine (4 - 2025-26 season) 02/29/2024*   Flu Shot  07/30/2024*   Eye exam for diabetics  04/06/2024   Pneumococcal Vaccine for age over 40 (3 of 3 - PCV20 or PCV21) 06/06/2024   Breast Cancer Screening  08/13/2024   Hemoglobin A1C  08/13/2024   DEXA scan (bone density measurement)  02/02/2025   Yearly kidney health urinalysis for diabetes  02/12/2025   Yearly kidney function blood test for diabetes  02/19/2025   Colon Cancer Screening  12/06/2032   DTaP/Tdap/Td vaccine (3 - Td or Tdap) 02/02/2033   Hepatitis C Screening  Completed   Zoster (Shingles) Vaccine  Completed   Meningitis B Vaccine  Aged Out  *Topic was postponed. The date shown is not the original due date.        02/28/2024    8:49 AM  Advanced Directives  Does Patient Have a Medical Advance Directive? Yes  Type of Estate Agent of [place];Living will  Copy of Healthcare Power of Attorney in Chart? No - copy requested   Advance Care Planning is important because it: Ensures you receive medical care that aligns with your values, goals, and preferences. Provides guidance to your family and loved ones, reducing the emotional burden of decision-making during critical moments.  Vision: Annual vision screenings are recommended for early detection of glaucoma, cataracts, and diabetic retinopathy. These exams can also reveal signs of chronic conditions such as diabetes and high blood pressure.  Dental: Annual dental screenings help detect early signs of oral cancer, gum disease, and other conditions linked to overall health, including heart disease and diabetes.  Please see the attached documents for additional preventive care recommendations.

## 2024-02-28 NOTE — Progress Notes (Signed)
 Subjective:   Alexandria Tucker is a 68 y.o. who presents for a Medicare Wellness preventive visit.  As a reminder, Annual Wellness Visits don't include a physical exam, and some assessments may be limited, especially if this visit is performed virtually. We may recommend an in-person follow-up visit with your provider if needed.  Visit Complete: Virtual I connected with  Alexandria Tucker on 02/28/24 by a audio enabled telemedicine application and verified that I am speaking with the correct person using two identifiers.  Patient Location: Home  Provider Location: Home Office  I discussed the limitations of evaluation and management by telemedicine. The patient expressed understanding and agreed to proceed.  Vital Signs: Because this visit was a virtual/telehealth visit, some criteria may be missing or patient reported. Any vitals not documented were not able to be obtained and vitals that have been documented are patient reported.  VideoDeclined- This patient declined Librarian, academic. Therefore the visit was completed with audio only.  Persons Participating in Visit: Patient.  AWV Questionnaire: No: Patient Medicare AWV questionnaire was not completed prior to this visit.  Cardiac Risk Factors include: advanced age (>107men, >89 women);diabetes mellitus;dyslipidemia;hypertension     Objective:    Today's Vitals   02/28/24 0855  BP: 128/72  Pulse: 78  Weight: 153 lb (69.4 kg)  Height: 5' 4 (1.626 m)   Body mass index is 26.26 kg/m.     02/28/2024    8:49 AM 11/18/2022    3:40 PM 11/21/2016    2:05 AM 04/01/2016    6:33 AM 03/29/2016   12:39 PM  Advanced Directives  Does Patient Have a Medical Advance Directive? Yes Yes No  No  No   Type of Estate Agent of [place];Living will Healthcare Power of [place];Out of facility DNR (pink MOST or yellow form);Living will     Copy of Healthcare Power of Attorney in Chart? No -  copy requested      Would patient like information on creating a medical advance directive?    No - Patient declined       Data saved with a previous flowsheet row definition    Current Medications (verified) Outpatient Encounter Medications as of 02/28/2024  Medication Sig   aspirin EC 81 MG tablet Take 81 mg by mouth daily. (Patient not taking: Reported on 02/28/2024)   blood glucose meter kit and supplies Dispense based on patient and insurance preference. Use up to four times daily as directed. (FOR ICD-10 E10.9, E11.9). (Patient not taking: Reported on 02/28/2024)   fexofenadine  (ALLEGRA ) 180 MG tablet Take 1 tablet (180 mg total) by mouth daily. (Patient not taking: Reported on 02/28/2024)   fluticasone  (FLONASE ) 50 MCG/ACT nasal spray One to 2 sprays each nostril at bedtime (Patient not taking: Reported on 02/28/2024)   gabapentin  (NEURONTIN ) 300 MG capsule Take 1 capsule (300 mg total) by mouth at bedtime. (Patient not taking: Reported on 02/28/2024)   levothyroxine  (SYNTHROID ) 25 MCG tablet TAKE 1 TABLET BY MOUTH BEFORE BREAKFAST (Patient not taking: Reported on 02/28/2024)   metFORMIN  (GLUCOPHAGE ) 1000 MG tablet Take 1 tablet (1,000 mg total) by mouth 2 (two) times daily with a meal. (Patient not taking: Reported on 02/28/2024)   ondansetron  (ZOFRAN ) 4 MG tablet Take 1 tablet (4 mg total) by mouth every 8 (eight) hours as needed for nausea or vomiting. (Patient not taking: Reported on 02/28/2024)   OZEMPIC , 0.25 OR 0.5 MG/DOSE, 2 MG/3ML SOPN Inject into the skin. (Patient not  taking: Reported on 02/28/2024)   pantoprazole  (PROTONIX ) 20 MG tablet Take 1 tablet (20 mg total) by mouth daily. (Patient not taking: Reported on 02/28/2024)   rosuvastatin  (CRESTOR ) 20 MG tablet Take 1 tablet (20 mg total) by mouth daily. (Patient not taking: Reported on 02/28/2024)   No facility-administered encounter medications on file as of 02/28/2024.    Allergies (verified) Other, Atorvastatin ,  Augmentin [amoxicillin-pot clavulanate], Bactrim  [sulfamethoxazole -trimethoprim ], Lipitor [atorvastatin  calcium ], and Niaspan [niacin er (antihyperlipidemic)]   History: Past Medical History:  Diagnosis Date   Breast mass, right    Diabetes mellitus without complication (HCC)    History of kidney stones    Hyperlipidemia    Hypertension    Past Surgical History:  Procedure Laterality Date   ABDOMINAL HYSTERECTOMY     BREAST EXCISIONAL BIOPSY Right 2017   Excision for Rt. Infected Duct   BREAST LUMPECTOMY WITH RADIOACTIVE SEED LOCALIZATION Right 04/01/2016   Procedure: RIGHT BREAST LUMPECTOMY WITH RADIOACTIVE SEED LOCALIZATION;  Surgeon: Morene Olives, MD;  Location: [facility];  Service: General;  Laterality: Right;   CESAREAN SECTION     CYSTOSCOPY/RETROGRADE/URETEROSCOPY Right 11/23/2016   Procedure: CYSTOSCOPY/RIGHT RETROGRADE/URETEROSCOPY LASER LITHOTRIPSY AND STONE REMOVAL ;  Surgeon: Renda Glance, MD;  Location: [facility];  Service: Urology;  Laterality: Right;   Kidney stones     TUBAL LIGATION     Family History  Problem Relation Age of Onset   Hypertension Mother    Heart disease Father    Heart disease Brother    Hypertension Brother    Hypertension Brother    Social History   Socioeconomic History   Marital status: Married    Spouse name: Not on file   Number of children: Not on file   Years of education: Not on file   Highest education level: Not on file  Occupational History   Not on file  Tobacco Use   Smoking status: Never   Smokeless tobacco: Never  Vaping Use   Vaping status: Never Used  Substance and Sexual Activity   Alcohol use: Yes    Alcohol/week: 0.0 standard drinks of alcohol    Comment: social   Drug use: No   Sexual activity: Not on file  Other Topics Concern   Not on file  Social History Narrative   Not on file   Social Drivers of Health   Financial Resource Strain: Low Risk  (02/28/2024)   Overall Financial  Resource Strain (CARDIA)    Difficulty of Paying Living Expenses: Not hard at all  Food Insecurity: No Food Insecurity (02/28/2024)   Hunger Vital Sign    Worried About Running Out of Food in the Last Year: Never true    Ran Out of Food in the Last Year: Never true  Transportation Needs: No Transportation Needs (02/28/2024)   PRAPARE - Administrator, Civil Service (Medical): No    Lack of Transportation (Non-Medical): No  Physical Activity: Inactive (02/28/2024)   Exercise Vital Sign    Days of Exercise per Week: 0 days    Minutes of Exercise per Session: 0 min  Stress: No Stress Concern Present (02/28/2024)   [employer] of Occupational Health - Occupational Stress Questionnaire    Feeling of Stress: Not at all  Social Connections: Socially Isolated (02/28/2024)   Social Connection and Isolation Panel    Frequency of Communication with Friends and Family: Once a week    Frequency of Social Gatherings with Friends and Family: Once a week  Attends Religious Services: Never    Active Member of Clubs or Organizations: No    Attends Banker Meetings: Never    Marital Status: Married    Tobacco Counseling Counseling given: Yes    Clinical Intake:  Pre-visit preparation completed: Yes  Pain : No/denies pain     BMI - recorded: 26.26 Nutritional Status: BMI 25 -29 Overweight Nutritional Risks: None Diabetes: Yes  Lab Results  Component Value Date   HGBA1C 5.4 02/13/2024   HGBA1C 5.8 (H) 02/03/2023   HGBA1C 7.5 (H) 01/17/2022     How often do you need to have someone help you when you read instructions, pamphlets, or other written materials from your doctor or pharmacy?: 1 - Never  Interpreter Needed?: No  Information entered by :: alia t/cma   Activities of Daily Living     02/28/2024    8:49 AM  In your present state of health, do you have any difficulty performing the following activities:  Hearing? 0  Vision? 0  Difficulty  concentrating or making decisions? 0  Walking or climbing stairs? 0  Dressing or bathing? 0  Doing errands, shopping? 0  Preparing Food and eating ? N  Using the Toilet? N  In the past six months, have you accidently leaked urine? N  Do you have problems with loss of bowel control? N  Managing your Medications? N  Managing your Finances? N  Housekeeping or managing your Housekeeping? N    Patient Care Team: Lavell Bari LABOR, FNP as PCP - General (Nurse Practitioner) Odetta, A. Robynn, MD (Ophthalmology) Gretta Gums, MD as Consulting Physician (Obstetrics)  I have updated your Care Teams any recent Medical Services you may have received from other providers in the past year.     Assessment:   This is a routine wellness examination for [facility].  Hearing/Vision screen Hearing Screening - Comments:: Pt denies hearing dif Vision Screening - Comments:: [facility] [place] ov [facility]25   Goals Addressed             This Visit's Progress    Patient Stated       Like to get settle into her new home       Depression Screen     02/28/2024    8:51 AM 02/27/2023    8:57 AM 03/29/2022   11:02 AM 07/19/2021    7:59 AM 01/15/2021    8:27 AM 07/17/2020    8:08 AM 12/06/2019    8:18 AM  PHQ 2/9 Scores  PHQ - 2 Score 0 0 0 0 0 0 0  PHQ- 9 Score  0 0  0      Fall Risk     02/28/2024    8:55 AM 08/30/2022   11:12 AM 03/29/2022   11:02 AM 07/19/2021    7:59 AM 01/15/2021    8:28 AM  Fall Risk   Falls in the past year? 0 0 0 0 0  Number falls in past yr: 0 0     Injury with Fall? 0 0     Risk for fall due to : No Fall Risks No Fall Risks     Follow up Falls evaluation completed;Education provided Falls evaluation completed   Falls evaluation completed      Data saved with a previous flowsheet row definition    MEDICARE RISK AT HOME:  Medicare Risk at Home Any stairs in or around the home?: Yes If so, are there any without  handrails?: Yes Home free of  loose throw rugs in walkways, pet beds, electrical cords, etc?: Yes Adequate lighting in your home to reduce risk of falls?: Yes Life alert?: No Use of a cane, walker or w/c?: No Grab bars in the bathroom?: No Shower chair or bench in shower?: No Elevated toilet seat or a handicapped toilet?: No  TIMED UP AND GO:  Was the test performed?  no  Cognitive Function: 6CIT completed    02/27/2023    9:00 AM  MMSE - Mini Mental State Exam  Orientation to time 5  Orientation to Place 5  Registration 3  Attention/ Calculation 5  Recall 3  Language- name 2 objects 2  Language- repeat 1  Language- follow 3 step command 3  Language- read & follow direction 1  Write a sentence 1  Copy design 1  Total score 30        02/28/2024    8:50 AM  6CIT Screen  What Year? 0 points  What month? 0 points  What time? 0 points  Count back from 20 0 points  Months in reverse 0 points  Repeat phrase 0 points  Total Score 0 points    Immunizations Immunization History  Administered Date(s) Administered   Fluad Trivalent(High Dose 65+) 03/27/2023   Influenza Split 02/24/2013   Influenza,inj,Quad PF,6+ Mos 07/04/2014, 04/10/2015, 01/22/2016, 03/03/2017, 02/08/2018, 02/08/2019, 03/05/2020, 01/15/2021, 01/17/2022   Moderna SARS-COV2 Booster Vaccination 04/22/2020   Moderna Sars-Covid-2 Vaccination 07/05/2019, 08/06/2019, 12/23/2020   Pneumococcal Conjugate-13 07/04/2014   Pneumococcal Polysaccharide-23 06/07/2019   Tdap 01/14/2011, 02/03/2023   Zoster Recombinant(Shingrix ) 12/14/2018, 02/27/2023   Zoster, Live 12/06/2019    Screening Tests Health Maintenance  Topic Date Due   FOOT EXAM  02/03/2024   COVID-19 Vaccine (4 - 2025-26 season) 02/29/2024 (Originally 01/01/2024)   Influenza Vaccine  07/30/2024 (Originally 12/01/2023)   OPHTHALMOLOGY EXAM  04/06/2024   Pneumococcal Vaccine: 50+ Years (3 of 3 - PCV20 or PCV21) 06/06/2024   Mammogram  08/13/2024   HEMOGLOBIN A1C  08/13/2024    DEXA SCAN  02/02/2025   Diabetic kidney evaluation - Urine ACR  02/12/2025   Diabetic kidney evaluation - eGFR measurement  02/19/2025   Medicare Annual Wellness (AWV)  02/27/2025   Colonoscopy  12/06/2032   DTaP/Tdap/Td (3 - Td or Tdap) 02/02/2033   Hepatitis C Screening  Completed   Zoster Vaccines- Shingrix   Completed   Meningococcal B Vaccine  Aged Out    Health Maintenance Items Addressed: See Nurse Notes at the end of this note  Additional Screening:  Vision Screening: Recommended annual ophthalmology exams for early detection of glaucoma and other disorders of the eye. Is the patient up to date with their annual eye exam?  Yes  Who is the provider or what is the name of the office in which the patient attends annual eye exams? [facility] in [place], [place]  Dental Screening: Recommended annual dental exams for proper oral hygiene  Community Resource Referral / Chronic Care Management: CRR required this visit?  No   CCM required this visit?  No   Plan:    I have personally reviewed and noted the following in the patient's chart:   Medical and social history Use of alcohol, tobacco or illicit drugs  Current medications and supplements including opioid prescriptions. Patient is not currently taking opioid prescriptions. Functional ability and status Nutritional status Physical activity Advanced directives List of other physicians Hospitalizations, surgeries, and ER visits in previous 12 months Vitals Screenings to  include cognitive, depression, and falls Referrals and appointments  In addition, I have reviewed and discussed with patient certain preventive protocols, quality metrics, and best practice recommendations. A written personalized care plan for preventive services as well as general preventive health recommendations were provided to patient.   Alexandria Tucker, CMA   02/28/2024   After Visit Summary: (MyChart) Due to this being a telephonic visit, the  after visit summary with patients personalized plan was offered to patient via MyChart   Notes: Nothing significant to report at this time.

## 2024-03-21 ENCOUNTER — Ambulatory Visit: Admitting: Family

## 2024-03-26 ENCOUNTER — Ambulatory Visit (INDEPENDENT_AMBULATORY_CARE_PROVIDER_SITE_OTHER): Payer: Self-pay | Admitting: Family

## 2024-03-26 ENCOUNTER — Encounter: Payer: Self-pay | Admitting: Family

## 2024-03-26 VITALS — BP 139/74 | HR 73 | Temp 97.5°F | Ht 64.0 in | Wt 163.4 lb

## 2024-03-26 DIAGNOSIS — E1159 Type 2 diabetes mellitus with other circulatory complications: Secondary | ICD-10-CM | POA: Diagnosis not present

## 2024-03-26 DIAGNOSIS — Z23 Encounter for immunization: Secondary | ICD-10-CM

## 2024-03-26 DIAGNOSIS — E039 Hypothyroidism, unspecified: Secondary | ICD-10-CM

## 2024-03-26 DIAGNOSIS — E1169 Type 2 diabetes mellitus with other specified complication: Secondary | ICD-10-CM | POA: Diagnosis not present

## 2024-03-26 DIAGNOSIS — I152 Hypertension secondary to endocrine disorders: Secondary | ICD-10-CM | POA: Diagnosis not present

## 2024-03-26 DIAGNOSIS — E663 Overweight: Secondary | ICD-10-CM | POA: Diagnosis not present

## 2024-03-26 DIAGNOSIS — E785 Hyperlipidemia, unspecified: Secondary | ICD-10-CM | POA: Diagnosis not present

## 2024-03-26 DIAGNOSIS — Z7985 Long-term (current) use of injectable non-insulin antidiabetic drugs: Secondary | ICD-10-CM

## 2024-03-26 DIAGNOSIS — K219 Gastro-esophageal reflux disease without esophagitis: Secondary | ICD-10-CM

## 2024-03-26 LAB — CMP14+EGFR
ALT: 7 IU/L (ref 0–32)
AST: 11 IU/L (ref 0–40)
Albumin: 4.2 g/dL (ref 3.9–4.9)
Alkaline Phosphatase: 67 IU/L (ref 49–135)
BUN/Creatinine Ratio: 16 (ref 12–28)
BUN: 27 mg/dL (ref 8–27)
Bilirubin Total: 0.5 mg/dL (ref 0.0–1.2)
CO2: 22 mmol/L (ref 20–29)
Calcium: 9.9 mg/dL (ref 8.7–10.3)
Chloride: 104 mmol/L (ref 96–106)
Creatinine, Ser: 1.64 mg/dL — ABNORMAL HIGH (ref 0.57–1.00)
Globulin, Total: 2.4 g/dL (ref 1.5–4.5)
Glucose: 91 mg/dL (ref 70–99)
Potassium: 4.3 mmol/L (ref 3.5–5.2)
Sodium: 141 mmol/L (ref 134–144)
Total Protein: 6.6 g/dL (ref 6.0–8.5)
eGFR: 34 mL/min/1.73 — ABNORMAL LOW (ref >59)

## 2024-03-26 NOTE — Progress Notes (Signed)
 Subjective:    Patient ID: Alexandria Tucker, female    DOB: 12/06/1955, 68 y.o.   MRN: [id number]  Chief Complaint  Patient presents with   Follow-up   PT presents to the office today for to follow up on weight loss, N&V.   She is complaining of bilateral burning and tingling pain of feet of 5 out 10 that comes and goes. Has gabapentin  she can take as needed.   She is followed by Endocrinologists for DM.  She is followed by Dermatologists as needed.   She held her Ozempic  0.25 mg for a month. She has gained 10 lbs.  Her starting weight was 249 lb.      03/26/2024    9:00 AM 02/28/2024    8:55 AM 02/20/2024   10:21 AM  Last 3 Weights  Weight (lbs) 163 lb 6.4 oz 153 lb 153 lb 3.2 oz  Weight (kg) 74.118 kg 69.4 kg 69.491 kg     Hypertension This is a chronic problem. The current episode started more than 1 year ago. The problem has been waxing and waning since onset. The problem is uncontrolled. Associated symptoms include malaise/fatigue. Pertinent negatives include no blurred vision, peripheral edema or shortness of breath. Risk factors for coronary artery disease include dyslipidemia, diabetes mellitus and obesity. The current treatment provides moderate improvement. Identifiable causes of hypertension include a thyroid  problem.  Gastroesophageal Reflux She complains of belching and heartburn. She reports no hoarse voice or no nausea. This is a chronic problem. The current episode started more than 1 year ago. The problem occurs occasionally. The problem has been unchanged. The symptoms are aggravated by certain foods. Pertinent negatives include no fatigue. Risk factors include obesity. She has tried a PPI for the symptoms. The treatment provided moderate relief.  Hyperlipidemia This is a chronic problem. The current episode started more than 1 year ago. The problem is controlled. Recent lipid tests were reviewed and are normal. Exacerbating diseases include obesity. Pertinent  negatives include no shortness of breath. Current antihyperlipidemic treatment includes exercise. The current treatment provides moderate improvement of lipids. Risk factors for coronary artery disease include dyslipidemia, diabetes mellitus, hypertension, a sedentary lifestyle and post-menopausal.  Diabetes She presents for her follow-up diabetic visit. She has type 2 diabetes mellitus. Associated symptoms include foot paresthesias. Pertinent negatives for diabetes include no blurred vision and no fatigue. Symptoms are stable. Diabetic complications include peripheral neuropathy. Risk factors for coronary artery disease include dyslipidemia, diabetes mellitus, hypertension, sedentary lifestyle and post-menopausal. She is following a generally healthy diet. Her overall blood glucose range is 130-140 mg/dl. Eye exam is current.  Thyroid  Problem Presents for follow-up visit. Symptoms include dry skin. Patient reports no constipation, depressed mood, diarrhea, fatigue, hoarse voice or weight gain. The symptoms have been stable.      Review of Systems  Constitutional:  Positive for malaise/fatigue. Negative for fatigue and weight gain.  HENT:  Negative for hoarse voice.   Eyes:  Negative for blurred vision.  Respiratory:  Negative for shortness of breath.   Gastrointestinal:  Positive for heartburn. Negative for constipation, diarrhea and nausea.  All other systems reviewed and are negative.   Family History  Problem Relation Age of Onset   Hypertension Mother    Heart disease Father    Heart disease Brother    Hypertension Brother    Hypertension Brother    Social History   Socioeconomic History   Marital status: Married    Spouse name: Not on file  Number of children: Not on file   Years of education: Not on file   Highest education level: Not on file  Occupational History   Not on file  Tobacco Use   Smoking status: Never   Smokeless tobacco: Never  Vaping Use   Vaping status:  Never Used  Substance and Sexual Activity   Alcohol use: Yes    Alcohol/week: 0.0 standard drinks of alcohol    Comment: social   Drug use: No   Sexual activity: Not on file  Other Topics Concern   Not on file  Social History Narrative   Not on file   Social Drivers of Health   Financial Resource Strain: Low Risk  (02/28/2024)   Overall Financial Resource Strain (CARDIA)    Difficulty of Paying Living Expenses: Not hard at all  Food Insecurity: No Food Insecurity (02/28/2024)   Hunger Vital Sign    Worried About Running Out of Food in the Last Year: Never true    Ran Out of Food in the Last Year: Never true  Transportation Needs: No Transportation Needs (02/28/2024)   PRAPARE - Administrator, Civil Service (Medical): No    Lack of Transportation (Non-Medical): No  Physical Activity: Inactive (02/28/2024)   Exercise Vital Sign    Days of Exercise per Week: 0 days    Minutes of Exercise per Session: 0 min  Stress: No Stress Concern Present (02/28/2024)   [employer] of Occupational Health - Occupational Stress Questionnaire    Feeling of Stress: Not at all  Social Connections: Socially Isolated (02/28/2024)   Social Connection and Isolation Panel    Frequency of Communication with Friends and Family: Once a week    Frequency of Social Gatherings with Friends and Family: Once a week    Attends Religious Services: Never    Database Administrator or Organizations: No    Attends Banker Meetings: Never    Marital Status: Married       Objective:   Physical Exam Vitals reviewed.  Constitutional:      General: She is not in acute distress.    Appearance: She is well-developed. She is obese.  HENT:     Head: Normocephalic and atraumatic.     Right Ear: Tympanic membrane normal.     Left Ear: Tympanic membrane normal.  Eyes:     Pupils: Pupils are equal, round, and reactive to light.  Neck:     Thyroid : No thyromegaly.  Cardiovascular:      Rate and Rhythm: Normal rate and regular rhythm.     Heart sounds: Normal heart sounds. No murmur heard. Pulmonary:     Effort: Pulmonary effort is normal. No respiratory distress.     Breath sounds: Normal breath sounds. No wheezing.  Abdominal:     General: Bowel sounds are normal. There is no distension.     Palpations: Abdomen is soft.     Tenderness: There is no abdominal tenderness.  Musculoskeletal:        General: No tenderness. Normal range of motion.     Cervical back: Normal range of motion and neck supple.  Skin:    General: Skin is warm and dry.  Neurological:     Mental Status: She is alert and oriented to person, place, and time.     Cranial Nerves: No cranial nerve deficit.     Deep Tendon Reflexes: Reflexes are normal and symmetric.  Psychiatric:  Behavior: Behavior normal.        Thought Content: Thought content normal.        Judgment: Judgment normal.      Diabetic Foot Exam - Simple   Simple Foot Form Diabetic Foot exam was performed with the following findings: Yes 03/26/2024  9:35 AM  Visual Inspection No deformities, no ulcerations, no other skin breakdown bilaterally: Yes Sensation Testing Intact to touch and monofilament testing bilaterally: Yes Pulse Check Posterior Tibialis and Dorsalis pulse intact bilaterally: Yes Comments Dry skin      BP (!) 147/74   Pulse 73   Temp (!) 97.5 F (36.4 C) (Temporal)   Ht 5' 4 (1.626 m)   Wt 163 lb 6.4 oz (74.1 kg)   SpO2 98%   BMI 28.05 kg/m      Assessment & Plan:  Alexandria Tucker comes in today with chief complaint of Follow-up   Diagnosis and orders addressed:  1. Encounter for immunization - Flu vaccine HIGH DOSE PF(Fluzone Trivalent)  2. Hyperlipidemia associated with type 2 diabetes mellitus (HCC) - CMP14+EGFR  3. Type 2 diabetes mellitus with other specified complication, without long-term current use of insulin (HCC) (Primary) - CMP14+EGFR  4. Overweight (BMI  25.0-29.9) - CMP14+EGFR  5. Hypertension associated with diabetes (HCC) - CMP14+EGFR  6. Hypothyroidism, unspecified type - CMP14+EGFR  7. Gastroesophageal reflux disease, unspecified whether esophagitis present - CMP14+EGFR    Labs pending Continue Ozempic  0.25 mg  Health Maintenance reviewed Diet and exercise encouraged  Follow up plan: 3 months    Bari Learn, FNP

## 2024-03-26 NOTE — Patient Instructions (Signed)
 Health Maintenance After Age 66 After age 27, you are at a higher risk for certain long-term diseases and infections as well as injuries from falls. Falls are a major cause of broken bones and head injuries in people who are older than age 73. Getting regular preventive care can help to keep you healthy and well. Preventive care includes getting regular testing and making lifestyle changes as recommended by your health care provider. Talk with your health care provider about: Which screenings and tests you should have. A screening is a test that checks for a disease when you have no symptoms. A diet and exercise plan that is right for you. What should I know about screenings and tests to prevent falls? Screening and testing are the best ways to find a health problem early. Early diagnosis and treatment give you the best chance of managing medical conditions that are common after age 90. Certain conditions and lifestyle choices may make you more likely to have a fall. Your health care provider may recommend: Regular vision checks. Poor vision and conditions such as cataracts can make you more likely to have a fall. If you wear glasses, make sure to get your prescription updated if your vision changes. Medicine review. Work with your health care provider to regularly review all of the medicines you are taking, including over-the-counter medicines. Ask your health care provider about any side effects that may make you more likely to have a fall. Tell your health care provider if any medicines that you take make you feel dizzy or sleepy. Strength and balance checks. Your health care provider may recommend certain tests to check your strength and balance while standing, walking, or changing positions. Foot health exam. Foot pain and numbness, as well as not wearing proper footwear, can make you more likely to have a fall. Screenings, including: Osteoporosis screening. Osteoporosis is a condition that causes  the bones to get weaker and break more easily. Blood pressure screening. Blood pressure changes and medicines to control blood pressure can make you feel dizzy. Depression screening. You may be more likely to have a fall if you have a fear of falling, feel depressed, or feel unable to do activities that you used to do. Alcohol  use screening. Using too much alcohol  can affect your balance and may make you more likely to have a fall. Follow these instructions at home: Lifestyle Do not drink alcohol  if: Your health care provider tells you not to drink. If you drink alcohol : Limit how much you have to: 0-1 drink a day for women. 0-2 drinks a day for men. Know how much alcohol  is in your drink. In the [place] one drink equals one 12 oz bottle of beer (355 mL), one 5 oz glass of wine (148 mL), or one 1 oz glass of hard liquor (44 mL). Do not use any products that contain nicotine or tobacco. These products include cigarettes, chewing tobacco, and vaping devices, such as e-cigarettes. If you need help quitting, ask your health care provider. Activity  Follow a regular exercise program to stay fit. This will help you maintain your balance. Ask your health care provider what types of exercise are appropriate for you. If you need a cane or walker, use it as recommended by your health care provider. Wear supportive shoes that have nonskid soles. Safety  Remove any tripping hazards, such as rugs, cords, and clutter. Install safety equipment such as grab bars in bathrooms and safety rails on stairs. Keep rooms and walkways  well-lit. General instructions Talk with your health care provider about your risks for falling. Tell your health care provider if: You fall. Be sure to tell your health care provider about all falls, even ones that seem minor. You feel dizzy, tiredness (fatigue), or off-balance. Take over-the-counter and prescription medicines only as told by your health care provider. These include  supplements. Eat a healthy diet and maintain a healthy weight. A healthy diet includes low-fat dairy products, low-fat (lean) meats, and fiber from whole grains, beans, and lots of fruits and vegetables. Stay current with your vaccines. Schedule regular health, dental, and eye exams. Summary Having a healthy lifestyle and getting preventive care can help to protect your health and wellness after age 15. Screening and testing are the best way to find a health problem early and help you avoid having a fall. Early diagnosis and treatment give you the best chance for managing medical conditions that are more common for people who are older than age 42. Falls are a major cause of broken bones and head injuries in people who are older than age 64. Take precautions to prevent a fall at home. Work with your health care provider to learn what changes you can make to improve your health and wellness and to prevent falls. This information is not intended to replace advice given to you by your health care provider. Make sure you discuss any questions you have with your health care provider. Document Revised: 09/07/2020 Document Reviewed: 09/07/2020 Elsevier Patient Education  2024 [employer].

## 2024-04-01 ENCOUNTER — Ambulatory Visit: Payer: Self-pay | Admitting: Family

## 2024-04-01 DIAGNOSIS — N1832 Chronic kidney disease, stage 3b: Secondary | ICD-10-CM | POA: Insufficient documentation

## 2024-04-11 DIAGNOSIS — E119 Type 2 diabetes mellitus without complications: Secondary | ICD-10-CM | POA: Diagnosis not present

## 2024-04-11 LAB — OPHTHALMOLOGY REPORT-SCANNED

## 2024-04-14 ENCOUNTER — Other Ambulatory Visit: Payer: Self-pay | Admitting: Family

## 2024-04-14 DIAGNOSIS — M150 Primary generalized (osteo)arthritis: Secondary | ICD-10-CM

## 2024-04-15 ENCOUNTER — Telehealth: Payer: Self-pay | Admitting: Physical Medicine and Rehabilitation

## 2024-04-15 NOTE — Telephone Encounter (Signed)
 Pt called wanting to make an apt for an injection. Call back number is [phone]

## 2024-04-16 ENCOUNTER — Other Ambulatory Visit: Payer: Self-pay | Admitting: Physical Medicine and Rehabilitation

## 2024-04-16 ENCOUNTER — Telehealth: Payer: Self-pay | Admitting: Physical Medicine and Rehabilitation

## 2024-04-16 MED ORDER — METHOCARBAMOL 500 MG PO TABS: 500.0000 mg | ORAL_TABLET | Freq: Three times a day (TID) | ORAL | 0 refills | Status: DC

## 2024-04-16 NOTE — Telephone Encounter (Signed)
 Pt called to see if any updates on approval for another injection. Last injection was 08/01/22. Please call pt with an update at [phone].

## 2024-04-28 ENCOUNTER — Other Ambulatory Visit: Payer: Self-pay | Admitting: Family

## 2024-04-28 DIAGNOSIS — M150 Primary generalized (osteo)arthritis: Secondary | ICD-10-CM

## 2024-04-30 ENCOUNTER — Ambulatory Visit: Admitting: Physical Medicine and Rehabilitation

## 2024-04-30 ENCOUNTER — Encounter: Payer: Self-pay | Admitting: Physical Medicine and Rehabilitation

## 2024-04-30 DIAGNOSIS — G8929 Other chronic pain: Secondary | ICD-10-CM

## 2024-04-30 DIAGNOSIS — M5416 Radiculopathy, lumbar region: Secondary | ICD-10-CM | POA: Diagnosis not present

## 2024-04-30 DIAGNOSIS — M5441 Lumbago with sciatica, right side: Secondary | ICD-10-CM | POA: Diagnosis not present

## 2024-04-30 NOTE — Progress Notes (Unsigned)
 Pain Scale   Average Pain 5 Patient advising she has lower back pain that radiates bilaterally to legs at times.        +Driver, -BT, -Dye Allergies.

## 2024-04-30 NOTE — Progress Notes (Unsigned)
 "  Alexandria Tucker - 68 y.o. female MRN [id number]  Date of birth: November 25, 1955  Office Visit Note: Visit Date: 04/30/2024 PCP: Lavell Bari DELENA, FNP Referred by: Lavell Bari DELENA, FNP  Subjective: Chief Complaint  Patient presents with   Lower Back - Pain   HPI: Alexandria Tucker is a 68 y.o. female who comes in today for evaluation of chronic, worsening and severe right sided lower back pain radiating to buttock and down posterolateral leg to knee. She is previous patient of Dr. Barbarann. Pain ongoing for several years. Her pain worsens with standing and activity. She describes pain as burning and sore sensation, currently rates as 5 out of 10. Some relief of pain with home exercise regimen, heating pad, rest and medications. She continues with Gabapentin  and meloxicam . Lumbar MRI imaging from 2024 shows transitional L5 vertebrae, moderate to advanced bilateral facet hypertrophy at L4-L5, there is mild bilateral lateral recess and foraminal stenosis at this level. No high grade spinal canal stenosis. She underwent right L4-L5 interlaminar epidural steroid injection in our office on 08/01/2022. She reports greater than 80% relief of pain for over a year. Also reports increased functional ability post injection. History of right L3 and L4 transforaminal epidural steroid (2015) injection and multiple right L4-L5 and L5-S1 intra-articular facet joint injections (2016) in our office with good relief of pain.  Patient denies focal weakness, numbness and tingling. No recent trauma or falls.   Patients course is complicated by hypertension, diabetes mellitus, hypothyroidism and chronic kidney disease.      Review of Systems  Musculoskeletal:  Positive for back pain.  Neurological:  Negative for tingling, sensory change, focal weakness and weakness.  All other systems reviewed and are negative.  Otherwise per HPI.  Assessment & Plan: Visit Diagnoses:    ICD-10-CM   1. Chronic right-sided low back pain  with right-sided sciatica  M54.41 Ambulatory referral to Physical Medicine Rehab   G89.29     2. Lumbar radiculopathy  M54.16 Ambulatory referral to Physical Medicine Rehab       Plan: Findings:  Chronic, worsening and severe right sided lower back pain radiating to buttock and down posterolateral leg to knee. Patient continues to have pain despite good conservative therapies such as home exercise regimen, rest and use of medications. Patients clinical presentation and exam are consistent with lumbar radiculopathy, more of L5 nerve pattern. There is facet arthropathy, mild central canal stenosis, mild lateral recess and foraminal narrowing bilaterally at L4-L5. We discussed treatment plan in detail today. Next step is to perform diagnostic and hopefully therapeutic right L4-L5 interlaminar epidural steroid injection under fluoroscopic guidance. She is not currently taking anticoagulant medication. If good relief of pain we can repeat this procedure infrequently as needed. Should her pain present as more axial lower back we can look at repeating facet joint injections at some point. Would consider longer sustained pain relief with radiofrequency ablation. She has no questions at this time. Her exam today is non focal, good strength noted to bilateral lower extremities.     Meds & Orders: No orders of the defined types were placed in this encounter.   Orders Placed This Encounter  Procedures   Ambulatory referral to Physical Medicine Rehab    Follow-up: Return for Right L4-L5 interlaminar epidural steroid injection.   Procedures: No procedures performed      Clinical History: EXAM: MRI LUMBAR SPINE WITHOUT CONTRAST   TECHNIQUE: Multiplanar, multisequence MR imaging of the lumbar spine was performed. No  intravenous contrast was administered.   COMPARISON:  Lumbar spine radiographs 06/29/2022 and abdominopelvic CT 11/21/2016. Report only from lumbar MRI 07/05/2013.    FINDINGS: Segmentation: Transitional lumbosacral anatomy. In keeping with the numbering applied to the previous lumbar MRI, the transitional segment is assigned L5 and there are vestigial ribs at T12.   Alignment: Mild convex left scoliosis. There is 3 mm of degenerative anterolisthesis at L4-5.   Vertebrae: No worrisome osseous lesion, acute fracture or pars defect. There are mild sacroiliac degenerative changes bilaterally.   Conus medullaris: Extends to the L1 level and appears normal.   Paraspinal and other soft tissues: No significant paraspinal findings. Small renal sinus cysts bilaterally, similar to previous CT; no follow-up imaging recommended. Small dependent gallstones.   Disc levels:   T12-L1: Chronic spondylosis with loss of disc height and a calcified central disc protrusion. This appears unchanged from previous CT. There is chronic mild mass effect on the thecal sac, but no significant exiting T12 nerve root encroachment.   L1-2: Disc height and hydration are maintained. Minimal disc bulging. No spinal stenosis or nerve root encroachment.   L2-3: Preserved disc height with mild disc bulging and facet hypertrophy. No significant spinal stenosis or nerve root encroachment.   L3-4: Mild loss of disc height with disc bulging, facet and ligamentous hypertrophy. Resulting mild spinal stenosis with mild asymmetric right lateral recess and right foraminal narrowing, similar to previous abdominal CT.   L4-5: Mild loss of disc height with annular disc bulging and moderate to advanced bilateral facet hypertrophy. Resulting mild multifactorial spinal stenosis with mild lateral recess and foraminal narrowing bilaterally.   L5-S1: Disc height and hydration are maintained. Mild bilateral facet hypertrophy. No spinal stenosis or nerve root encroachment.   IMPRESSION: 1. Transitional lumbosacral anatomy. In keeping with the numbering applied to the previous lumbar MRI,  the transitional segment is assigned L5 and there are vestigial ribs at T12. 2. No acute findings or clear explanation for the patient's symptoms. 3. Mild multifactorial spinal stenosis at L3-4 and L4-5. At L3-4, there is mild asymmetric right lateral recess and right foraminal narrowing. At L4-5, there is mild lateral recess and foraminal narrowing bilaterally. 4. Chronic calcified central disc protrusion at T12-L1 without significant spinal stenosis or nerve root encroachment. 5. Cholelithiasis.     Electronically Signed   By: Elsie Perone M.D.   On: 07/11/2022 17:03   She reports that she has never smoked. She has never used smokeless tobacco.  Recent Labs    02/13/24 0851 02/20/24 1054  HGBA1C 5.4  --   LABURIC  --  2.5*    Objective:  VS:  HT:    WT:   BMI:     BP:   HR: bpm  TEMP: ( )  RESP:  Physical Exam Vitals and nursing note reviewed.  HENT:     Head: Normocephalic and atraumatic.     Right Ear: External ear normal.     Left Ear: External ear normal.     Nose: Nose normal.     Mouth/Throat:     Mouth: Mucous membranes are moist.  Eyes:     Extraocular Movements: Extraocular movements intact.  Cardiovascular:     Rate and Rhythm: Normal rate.     Pulses: Normal pulses.  Pulmonary:     Effort: Pulmonary effort is normal.  Abdominal:     General: Abdomen is flat. There is no distension.  Musculoskeletal:        General: Tenderness present.  Comments: Patient rises from seated position to standing without difficulty. Pain noted with facet loading and lumbar extension. 5/5 strength noted with bilateral hip flexion, knee flexion/extension, ankle dorsiflexion/plantarflexion and EHL. No clonus noted bilaterally. No pain upon palpation of greater trochanters. No pain with internal/external rotation of bilateral hips. Sensation intact bilaterally. Dysesthesias noted to right L5 dermatome. Negative slump test bilaterally. Ambulates without aid, gait steady.      Skin:    General: Skin is warm and dry.     Capillary Refill: Capillary refill takes less than 2 seconds.  Neurological:     General: No focal deficit present.     Mental Status: She is alert and oriented to person, place, and time.  Psychiatric:        Mood and Affect: Mood normal.        Behavior: Behavior normal.     Ortho Exam  Imaging: No results found.  Past Medical/Family/Surgical/Social History: Medications & Allergies reviewed per EMR, new medications updated. 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 obstruction 03/29/2022   Kidney mass 03/29/2022   Hepatic steatosis 03/29/2022   Gastroesophageal reflux disease 07/19/2021   Overweight (BMI 25.0-29.9) 01/22/2016   Allergic rhinitis 01/02/2015   Diabetes mellitus (HCC) 07/04/2014   Hyperlipidemia associated with type 2 diabetes mellitus (HCC) 06/21/2013   Hypertension associated with diabetes (HCC) 06/21/2013   Past Medical History:  Diagnosis Date   Breast mass, right    Diabetes mellitus without complication (HCC)    History of kidney stones    Hyperlipidemia    Hypertension    Family History  Problem Relation Age of Onset   Hypertension Mother    Heart disease Father    Heart disease Brother    Hypertension Brother    Hypertension Brother    Past Surgical History:  Procedure Laterality Date   ABDOMINAL HYSTERECTOMY     BREAST EXCISIONAL BIOPSY Right 2017   Excision for Rt. Infected Duct   BREAST LUMPECTOMY WITH RADIOACTIVE SEED LOCALIZATION Right 04/01/2016   Procedure: RIGHT BREAST LUMPECTOMY WITH RADIOACTIVE SEED LOCALIZATION;  Surgeon: Morene Olives, MD;  Location: [facility];  Service: General;  Laterality: Right;   CESAREAN SECTION     CYSTOSCOPY/RETROGRADE/URETEROSCOPY  Right 11/23/2016   Procedure: CYSTOSCOPY/RIGHT RETROGRADE/URETEROSCOPY LASER LITHOTRIPSY AND STONE REMOVAL ;  Surgeon: Renda Glance, MD;  Location: [facility];  Service: Urology;  Laterality: Right;   Kidney stones     TUBAL LIGATION     Social History   Occupational History   Not on file  Tobacco Use   Smoking status: Never   Smokeless tobacco: Never  Vaping Use   Vaping status: Never Used  Substance and Sexual Activity   Alcohol use: Yes    Alcohol/week: 0.0 standard drinks of alcohol    Comment: social   Drug use: No   Sexual activity: Not on file    "

## 2024-05-09 ENCOUNTER — Other Ambulatory Visit: Payer: Self-pay | Admitting: Family

## 2024-05-09 ENCOUNTER — Encounter: Payer: Self-pay | Admitting: Family

## 2024-05-09 ENCOUNTER — Ambulatory Visit: Payer: Self-pay | Admitting: Family

## 2024-05-09 ENCOUNTER — Ambulatory Visit: Admitting: Family

## 2024-05-09 ENCOUNTER — Ambulatory Visit (HOSPITAL_COMMUNITY)
Admission: RE | Admit: 2024-05-09 | Discharge: 2024-05-09 | Disposition: A | Discharge status: Home / Self Care | Source: Ambulatory Visit | Attending: Family | Admitting: Family

## 2024-05-09 VITALS — BP 169/81 | HR 87 | Temp 97.8°F | Ht 64.0 in | Wt 170.0 lb

## 2024-05-09 DIAGNOSIS — R1032 Left lower quadrant pain: Secondary | ICD-10-CM | POA: Diagnosis present

## 2024-05-09 DIAGNOSIS — R1012 Left upper quadrant pain: Secondary | ICD-10-CM | POA: Diagnosis present

## 2024-05-09 DIAGNOSIS — R1085 Abdominal pain of multiple sites: Secondary | ICD-10-CM

## 2024-05-09 DIAGNOSIS — M546 Pain in thoracic spine: Secondary | ICD-10-CM | POA: Insufficient documentation

## 2024-05-09 DIAGNOSIS — N200 Calculus of kidney: Secondary | ICD-10-CM

## 2024-05-09 LAB — URINALYSIS, COMPLETE
Bilirubin, UA: NEGATIVE
Ketones, UA: NEGATIVE
Leukocytes,UA: NEGATIVE
Nitrite, UA: NEGATIVE
RBC, UA: NEGATIVE
Specific Gravity, UA: 1.02 (ref 1.005–1.030)
Urobilinogen, Ur: 1 mg/dL (ref 0.2–1.0)
pH, UA: 7 (ref 5.0–7.5)

## 2024-05-09 LAB — MICROSCOPIC EXAMINATION
Epithelial Cells (non renal): NONE SEEN /HPF (ref 0–10)
RBC, Urine: NONE SEEN /HPF (ref 0–2)
Renal Epithel, UA: NONE SEEN /HPF
Yeast, UA: NONE SEEN

## 2024-05-09 LAB — POCT I-STAT CREATININE: Creatinine, Ser: 1.7 mg/dL — ABNORMAL HIGH (ref 0.44–1.00)

## 2024-05-09 MED ORDER — IOHEXOL 9 MG/ML PO SOLN
500.0000 mL | ORAL | Status: AC
  Administered 2024-05-09: 500 mL via ORAL

## 2024-05-09 MED ORDER — IOHEXOL 300 MG/ML  SOLN
100.0000 mL | Freq: Once | INTRAMUSCULAR | Status: AC | PRN
  Administered 2024-05-09: 100 mL via INTRAVENOUS

## 2024-05-09 MED ORDER — IOHEXOL 9 MG/ML PO SOLN
ORAL | Status: AC
  Filled 2024-05-09: qty 1000

## 2024-05-09 NOTE — Progress Notes (Signed)
 "  Subjective:    Patient ID: Alexandria Tucker, female    DOB: Nov 29, 1955, 69 y.o.   MRN: [id number]  Chief Complaint  Patient presents with   Back Pain    LOW   Abdominal Pain   SPOT IN MOUTH     ON THE ROOF    Pt presents to the office today with LLQ pain and left flank back pain that started 05/03/23 after moving heavy boxes.  Back Pain This is a new problem. The current episode started 1 to 4 weeks ago. The problem occurs constantly. The problem has been gradually worsening since onset. The pain is present in the lumbar spine. The quality of the pain is described as aching. The pain is at a severity of 8/10. The pain is moderate. The symptoms are aggravated by bending and twisting. Associated symptoms include abdominal pain. Pertinent negatives include no dysuria, fever or headaches.  Abdominal Pain This is a new problem. The current episode started 1 to 4 weeks ago. The pain is located in the LLQ. The pain is mild. Associated symptoms include nausea. Pertinent negatives include no arthralgias, belching, constipation, diarrhea, dysuria, fever, flatus, frequency, headaches, hematuria or vomiting. The treatment provided mild relief.       Review of Systems  Constitutional:  Negative for fever.  Gastrointestinal:  Positive for abdominal pain and nausea. Negative for constipation, diarrhea, flatus and vomiting.  Genitourinary:  Negative for dysuria, frequency and hematuria.  Musculoskeletal:  Positive for back pain. Negative for arthralgias.  Neurological:  Negative for headaches.  All other systems reviewed and are negative.   Social History   Socioeconomic History   Marital status: Married    Spouse name: Not on file   Number of children: Not on file   Years of education: Not on file   Highest education level: Not on file  Occupational History   Not on file  Tobacco Use   Smoking status: Never   Smokeless tobacco: Never  Vaping Use   Vaping status: Never Used  Substance  and Sexual Activity   Alcohol use: Yes    Alcohol/week: 0.0 standard drinks of alcohol    Comment: social   Drug use: No   Sexual activity: Not on file  Other Topics Concern   Not on file  Social History Narrative   Not on file   Social Drivers of Health   Tobacco Use: Low Risk (05/09/2024)   Patient History    Smoking Tobacco Use: Never    Smokeless Tobacco Use: Never    Passive Exposure: Not on file  Financial Resource Strain: Low Risk (02/28/2024)   Overall Financial Resource Strain (CARDIA)    Difficulty of Paying Living Expenses: Not hard at all  Food Insecurity: No Food Insecurity (02/28/2024)   Epic    Worried About Programme Researcher, Broadcasting/film/video in the Last Year: Never true    Ran Out of Food in the Last Year: Never true  Transportation Needs: No Transportation Needs (02/28/2024)   Epic    Lack of Transportation (Medical): No    Lack of Transportation (Non-Medical): No  Physical Activity: Inactive (02/28/2024)   Exercise Vital Sign    Days of Exercise per Week: 0 days    Minutes of Exercise per Session: 0 min  Stress: No Stress Concern Present (02/28/2024)   [employer] of Occupational Health - Occupational Stress Questionnaire    Feeling of Stress: Not at all  Social Connections: Socially Isolated (02/28/2024)   Social Connection  and Isolation Panel    Frequency of Communication with Friends and Family: Once a week    Frequency of Social Gatherings with Friends and Family: Once a week    Attends Religious Services: Never    Database Administrator or Organizations: No    Attends Banker Meetings: Never    Marital Status: Married  Depression (PHQ2-9): Low Risk (02/28/2024)   Depression (PHQ2-9)    PHQ-2 Score: 0  Alcohol Screen: Low Risk (02/28/2024)   Alcohol Screen    Last Alcohol Screening Score (AUDIT): 0  Housing: Unknown (02/28/2024)   Epic    Unable to Pay for Housing in the Last Year: No    Number of Times Moved in the Last Year: Not on file     Homeless in the Last Year: No  Utilities: Not At Risk (02/28/2024)   Epic    Threatened with loss of utilities: No  Health Literacy: Adequate Health Literacy (02/28/2024)   B1300 Health Literacy    Frequency of need for help with medical instructions: Never   Family History  Problem Relation Age of Onset   Hypertension Mother    Heart disease Father    Heart disease Brother    Hypertension Brother    Hypertension Brother         Objective:   Physical Exam Vitals reviewed.  Constitutional:      General: She is not in acute distress.    Appearance: She is well-developed.  HENT:     Head: Normocephalic and atraumatic.     Right Ear: Tympanic membrane normal.     Left Ear: Tympanic membrane normal.  Eyes:     Pupils: Pupils are equal, round, and reactive to light.  Neck:     Thyroid : No thyromegaly.  Cardiovascular:     Rate and Rhythm: Normal rate and regular rhythm.     Heart sounds: Normal heart sounds. No murmur heard. Pulmonary:     Effort: Pulmonary effort is normal. No respiratory distress.     Breath sounds: Normal breath sounds. No wheezing.  Abdominal:     General: Bowel sounds are normal. There is no distension.     Palpations: Abdomen is soft.     Tenderness: There is abdominal tenderness in the right upper quadrant.     Comments: Mild left CVA tenderness  Musculoskeletal:        General: No tenderness. Normal range of motion.     Cervical back: Normal range of motion and neck supple.  Skin:    General: Skin is warm and dry.  Neurological:     Mental Status: She is alert and oriented to person, place, and time.     Cranial Nerves: No cranial nerve deficit.     Deep Tendon Reflexes: Reflexes are normal and symmetric.  Psychiatric:        Behavior: Behavior normal.        Thought Content: Thought content normal.        Judgment: Judgment normal.       BP (!) 169/81   Pulse 87   Temp 97.8 F (36.6 C) (Temporal)   Ht 5' 4 (1.626 m)   Wt 170  lb (77.1 kg)   BMI 29.18 kg/m      Assessment & Plan:  Alexandria Tucker comes in today with chief complaint of Back Pain (LOW), Abdominal Pain, and SPOT IN MOUTH  (ON THE ROOF )   Diagnosis and orders addressed:  1. LLQ  abdominal pain (Primary) - Urinalysis, Complete - Urine Culture - CT ABDOMEN PELVIS W CONTRAST; Future - CBC with Differential/Platelet  2. Acute left-sided thoracic back pain - CT ABDOMEN PELVIS W CONTRAST; Future - CBC with Differential/Platelet  3. LUQ pain - CT ABDOMEN PELVIS W CONTRAST; Future - CBC with Differential/Platelet  4. Abdominal pain of multiple sites - CT ABDOMEN PELVIS W CONTRAST; Future - CBC with Differential/Platelet   Labs pending Stat CT pending  Avoid eating and drinking until cleared from scan  If negative, will treat as MSK Approx 55 mins spent with patient Return if symptoms worsen or fail to improve.    Bari Learn, FNP   "

## 2024-05-09 NOTE — Patient Instructions (Signed)
 Abdominal Pain, Adult  Pain in the abdomen (abdominal pain) can be caused by many things. In most cases, it gets better with no treatment or by being treated at home. But in some cases, it can be serious. Your health care provider will ask questions about your medical history and do a physical exam to try to figure out what is causing your pain. Follow these instructions at home: Medicines Take over-the-counter and prescription medicines only as told by your provider. Do not take medicines that help you poop (laxatives) unless told by your provider. General instructions Watch your condition for any changes. Drink enough fluid to keep your pee (urine) pale yellow. Contact a health care provider if: Your pain changes, gets worse, or lasts longer than expected. You have severe cramping or bloating in your abdomen, or you vomit. Your pain gets worse with meals, after eating, or with certain foods. You are constipated or have diarrhea for more than 2-3 days. You are not hungry, or you lose weight without trying. You have signs of dehydration. These may include: Dark pee, very little pee, or no pee. Cracked lips or dry mouth. Sleepiness or weakness. You have pain when you pee (urinate) or poop. Your abdominal pain wakes you up at night. You have blood in your pee. You have a fever. Get help right away if: You cannot stop vomiting. Your pain is only in one part of the abdomen. Pain on the right side could be caused by appendicitis. You have bloody or black poop (stool), or poop that looks like tar. You have trouble breathing. You have chest pain. These symptoms may be an emergency. Get help right away. Call 911. Do not wait to see if the symptoms will go away. Do not drive yourself to the hospital. This information is not intended to replace advice given to you by your health care provider. Make sure you discuss any questions you have with your health care provider. Document Revised:  02/02/2022 Document Reviewed: 02/02/2022 Elsevier Patient Education  2024 [employer].

## 2024-05-10 ENCOUNTER — Encounter (HOSPITAL_COMMUNITY): Payer: Self-pay | Admitting: Urology

## 2024-05-10 ENCOUNTER — Other Ambulatory Visit: Payer: Self-pay | Admitting: Urology

## 2024-05-10 LAB — CBC WITH DIFFERENTIAL/PLATELET
Basophils Absolute: 0.1 x10E3/uL (ref 0.0–0.2)
Basos: 1 %
EOS (ABSOLUTE): 0.2 x10E3/uL (ref 0.0–0.4)
Eos: 3 %
Hematocrit: 35.3 % (ref 34.0–46.6)
Hemoglobin: 11.4 g/dL (ref 11.1–15.9)
Immature Grans (Abs): 0 x10E3/uL (ref 0.0–0.1)
Immature Granulocytes: 0 %
Lymphocytes Absolute: 2 x10E3/uL (ref 0.7–3.1)
Lymphs: 25 %
MCH: 29.5 pg (ref 26.6–33.0)
MCHC: 32.3 g/dL (ref 31.5–35.7)
MCV: 92 fL (ref 79–97)
Monocytes Absolute: 0.6 x10E3/uL (ref 0.1–0.9)
Monocytes: 8 %
Neutrophils Absolute: 5.1 x10E3/uL (ref 1.4–7.0)
Neutrophils: 63 %
Platelets: 333 x10E3/uL (ref 150–450)
RBC: 3.86 x10E6/uL (ref 3.77–5.28)
RDW: 12.8 % (ref 11.7–15.4)
WBC: 8 x10E3/uL (ref 3.4–10.8)

## 2024-05-10 NOTE — Progress Notes (Signed)
 Attempted to obtain medical history for pre op call via telephone, unable to reach at this time. HIPAA compliant voicemail message left requesting return call to pre surgical testing department.

## 2024-05-10 NOTE — Progress Notes (Signed)
 LITHO PREOP PHONE CALL   ALLERGIES REVIEWED: YES  MEDICATION REVIEW DONE: YES MEDICATIONS THAT PT SHOULD HOLD (LIST): NSAIDS 48hr, Ozempic  hold 1 week last dose 1/4  CAN PT WALK UP STAIRS WITHOUT SHORTNESS OF BREATH: YES HOME O2: NO CPAP: NO  IF YES, INFORMED PT TO BRING CPAP WITH TUBING AND MASK:YES/NO   INFORMED DRIVER NEEDED FOR PROCEDURE: YES   PT WAS GIVEN BLUE FOLDER AT UROLOGY APPT: YES PT INFORMED TO BRING BLUE FOLDER WITH ALL CONTENTS: YES  REVIEWED ARRIVAL TIME AND LOCATION: YES  OTHER PERTINENT INFORMATION:

## 2024-05-11 LAB — URINE CULTURE

## 2024-05-13 ENCOUNTER — Telehealth: Payer: Self-pay | Admitting: Family Medicine

## 2024-05-13 ENCOUNTER — Ambulatory Visit (HOSPITAL_COMMUNITY)

## 2024-05-13 ENCOUNTER — Encounter (HOSPITAL_COMMUNITY): Payer: Self-pay | Admitting: Urology

## 2024-05-13 ENCOUNTER — Other Ambulatory Visit: Payer: Self-pay

## 2024-05-13 ENCOUNTER — Ambulatory Visit (HOSPITAL_COMMUNITY): Admission: RE | Admit: 2024-05-13 | Discharge: 2024-05-13 | Disposition: A | Attending: Urology | Admitting: Urology

## 2024-05-13 ENCOUNTER — Other Ambulatory Visit: Payer: Self-pay | Admitting: Urology

## 2024-05-13 ENCOUNTER — Encounter (HOSPITAL_COMMUNITY): Admission: RE | Disposition: A | Payer: Self-pay | Source: Home / Self Care | Attending: Urology

## 2024-05-13 DIAGNOSIS — N201 Calculus of ureter: Secondary | ICD-10-CM | POA: Insufficient documentation

## 2024-05-13 HISTORY — PX: EXTRACORPOREAL SHOCK WAVE LITHOTRIPSY: SHX1557

## 2024-05-13 MED ORDER — SODIUM CHLORIDE 0.9 % IV SOLN: INTRAVENOUS | Status: DC

## 2024-05-13 MED ORDER — DIAZEPAM 5 MG PO TABS
10.0000 mg | ORAL_TABLET | ORAL | Status: AC
  Administered 2024-05-13: 10 mg via ORAL
  Filled 2024-05-13: qty 2

## 2024-05-13 MED ORDER — DIPHENHYDRAMINE HCL 25 MG PO CAPS
25.0000 mg | ORAL_CAPSULE | ORAL | Status: AC
  Administered 2024-05-13: 25 mg via ORAL
  Filled 2024-05-13: qty 1

## 2024-05-13 MED ORDER — CIPROFLOXACIN HCL 500 MG PO TABS
500.0000 mg | ORAL_TABLET | ORAL | Status: AC
  Administered 2024-05-13: 500 mg via ORAL
  Filled 2024-05-13: qty 1

## 2024-05-13 NOTE — H&P (View-Only) (Signed)
 H&P  Chief Complaint: left ureteral stone   History of Present Illness:  69 yo with 11 mm and 5 mm left left proximal stone on CT 05/09/2024 Hu 200-300. UA with few bac. KUB today difficult to see stones. She denies dysuria, fever, cough or congestion. She has hiccups.     Past Medical History:  Diagnosis Date   Breast mass, right    Diabetes mellitus without complication (HCC)    History of kidney stones    Hyperlipidemia    Hypertension    Past Surgical History:  Procedure Laterality Date   ABDOMINAL HYSTERECTOMY     BREAST EXCISIONAL BIOPSY Right 2017   Excision for Rt. Infected Duct   BREAST LUMPECTOMY WITH RADIOACTIVE SEED LOCALIZATION Right 04/01/2016   Procedure: RIGHT BREAST LUMPECTOMY WITH RADIOACTIVE SEED LOCALIZATION;  Surgeon: Morene Olives, MD;  Location: [facility];  Service: General;  Laterality: Right;   CESAREAN SECTION     CYSTOSCOPY/RETROGRADE/URETEROSCOPY Right 11/23/2016   Procedure: CYSTOSCOPY/RIGHT RETROGRADE/URETEROSCOPY LASER LITHOTRIPSY AND STONE REMOVAL ;  Surgeon: Renda Glance, MD;  Location: [facility];  Service: Urology;  Laterality: Right;   Kidney stones     TUBAL LIGATION      Home Medications:  Medications Prior to Admission  Medication Sig Dispense Refill Last Dose/Taking   blood glucose meter kit and supplies Dispense based on patient and insurance preference. Use up to four times daily as directed. (FOR ICD-10 E10.9, E11.9). 1 each 0    fexofenadine  (ALLEGRA ) 180 MG tablet Take 1 tablet (180 mg total) by mouth daily. 90 tablet 1    gabapentin  (NEURONTIN ) 300 MG capsule Take 1 capsule (300 mg total) by mouth at bedtime. 90 capsule 0    levothyroxine  (SYNTHROID ) 25 MCG tablet TAKE 1 TABLET BY MOUTH BEFORE BREAKFAST (Patient not taking: Reported on 05/09/2024) 90 tablet 3    methocarbamol  (ROBAXIN ) 500 MG tablet Take 1 tablet (500 mg total) by mouth 3 (three) times daily. 90 tablet 0    OZEMPIC , 0.25 OR 0.5 MG/DOSE, 2 MG/3ML SOPN  Inject into the skin.      rosuvastatin  (CRESTOR ) 20 MG tablet Take 1 tablet (20 mg total) by mouth daily. 90 tablet 0    Allergies: Allergies[1]  Family History  Problem Relation Age of Onset   Hypertension Mother    Heart disease Father    Heart disease Brother    Hypertension Brother    Hypertension Brother    Social History:  reports that she has never smoked. She has never used smokeless tobacco. She reports current alcohol use. She reports that she does not use drugs.  ROS: A complete review of systems was performed.  All systems are negative except for pertinent findings as noted. Review of Systems  All other systems reviewed and are negative.    Physical Exam:  Vital signs in last 24 hours: Temp:  [97.5 F (36.4 C)] 97.5 F (36.4 C) (01/12 0815) Pulse Rate:  [82] 82 (01/12 0815) Resp:  [16] 16 (01/12 0815) BP: (172)/(79) 172/79 (01/12 0815) SpO2:  [100 %] 100 % (01/12 0815) Weight:  [74.8 kg] 74.8 kg (01/12 0815) General:  Alert and oriented, No acute distress HEENT: Normocephalic, atraumatic Cardiovascular: Regular rate and rhythm Lungs: Regular rate and effort Abdomen: Soft, nontender, nondistended, no abdominal masses Back: No CVA tenderness Extremities: No edema Neurologic: Grossly intact  Laboratory Data:  No results found for this or any previous visit (from the past 24 hours). Recent Results (from the past 240  hours)  Urine Culture     Status: None   Collection Time: 05/09/24 11:26 AM   Specimen: Urine   UR  Result Value Ref Range Status   Urine Culture, Routine Final report  Final   Organism ID, Bacteria Comment  Final    Comment: Mixed urogenital flora Less than 10,000 colonies/mL   Microscopic Examination     Status: Abnormal   Collection Time: 05/09/24 11:26 AM   Urine  Result Value Ref Range Status   WBC, UA 0-5 0 - 5 /hpf Final   RBC, Urine None seen 0 - 2 /hpf Final   Epithelial Cells (non renal) None seen 0 - 10 /hpf Final   Renal  Epithel, UA None seen None seen /hpf Final   Bacteria, UA Few (A) None seen/Few Final   Yeast, UA None seen None seen Final   Creatinine: Recent Labs    05/09/24 1510  CREATININE 1.70*    Impression/Assessment:  Left ureteral stone -   Plan:  Ivionna has presented today for surgery, with the diagnosis of LEFT URETERAL STONEs. The various methods of treatment have been discussed with the patient and family. After consideration of risks, benefits and other options for treatment, the patient has consented to  Procedures with comments: LITHOTRIPSY, ESWL (Left) - LEFT EXTRACORPOREAL SHOCKWAVE LITHOTRIPSY as a surgical intervention.     Donnice Brooks 05/13/2024     [1]  Allergies Allergen Reactions   Other Itching and Other (See Comments)   Atorvastatin  Other (See Comments)   Augmentin [Amoxicillin-Pot Clavulanate] Other (See Comments)    Thrush   Bactrim  [Sulfamethoxazole -Trimethoprim ]     rash with itching   Lipitor [Atorvastatin  Calcium ] Other (See Comments)    Leg pain   Niaspan [Niacin Er (Antihyperlipidemic)] Other (See Comments)    Burning, itching skin.

## 2024-05-13 NOTE — Telephone Encounter (Signed)
 Copied from CRM [id number]. Topic: Clinical - Medical Advice >> May 13, 2024 11:49 AM Alexandria Tucker wrote: Reason for CRM: The patient has been told by their urologist that they are unable to have their previously discussed kidney stone removed due to the fact that it was unable to be located on imaging. The patient would like to discuss what steps to take next. Please contact if/when possible

## 2024-05-13 NOTE — Telephone Encounter (Signed)
 I will have to let the Urologists decide the next steps. Are you still having pain? Any possible way you have passed part of the stone?

## 2024-05-13 NOTE — Op Note (Addendum)
 11 mm and 5 mm left proximal stones   Procedure: discontinued ESWL - see [facility] full op note   Findings: stones were not visible. HU 200-300 and leftover bowel gas, contrast, etc made visual more difficult.  No shockwave energy given as the stones were not localized after IV contrast (60 ml) isovue.

## 2024-05-13 NOTE — Progress Notes (Addendum)
 Date of COVID positive in last 90 days:  No  PCP - Bari Learn, FNP Cardiologist - Dorn Ross, MD (last OV 2017)  Chest x-ray - N/A EKG - DOS Stress Test - 09-11-15 Epic ECHO - 09-18-15 Epic Cardiac Cath - N/A Pacemaker/ICD device last checked:N/A Spinal Cord Stimulator:N/A  Bowel Prep - N/A  Sleep Study - N/A CPAP -   Fasting Blood Sugar - 120s or lower Checks Blood Sugar - 2 times a day  Ozempic  Last dose of GLP1 agonist-  05-05-24 GLP1 instructions:  Hold until after surgery   Last dose of SGLT-2 inhibitors-  N/A SGLT-2 instructions:  Do not take after    Blood Thinner Instructions: N/A Last dose:   Time: Aspirin Instructions:N/A Last Dose:  Activity level:  Can go up a flight of stairs and perform activities of daily living without stopping and without symptoms of chest pain or shortness of breath. Able to exercise without symptoms  Anesthesia review: N/A  Patient denies shortness of breath, fever, cough and chest pain at PAT appointment  Patient verbalized understanding of instructions that were given to them at the PAT appointment. Patient was also instructed that they will need to review over the PAT instructions again at home before surgery.

## 2024-05-13 NOTE — Telephone Encounter (Signed)
 Called and spoke with patient today and she states they decided to do surgery tomorrow at 12:30. FYI

## 2024-05-13 NOTE — H&P (Signed)
 H&P  Chief Complaint: left ureteral stone   History of Present Illness:  69 yo with 11 mm and 5 mm left left proximal stone on CT 05/09/2024 Hu 200-300. UA with few bac. KUB today difficult to see stones. She denies dysuria, fever, cough or congestion. She has hiccups.     Past Medical History:  Diagnosis Date   Breast mass, right    Diabetes mellitus without complication (HCC)    History of kidney stones    Hyperlipidemia    Hypertension    Past Surgical History:  Procedure Laterality Date   ABDOMINAL HYSTERECTOMY     BREAST EXCISIONAL BIOPSY Right 2017   Excision for Rt. Infected Duct   BREAST LUMPECTOMY WITH RADIOACTIVE SEED LOCALIZATION Right 04/01/2016   Procedure: RIGHT BREAST LUMPECTOMY WITH RADIOACTIVE SEED LOCALIZATION;  Surgeon: Morene Olives, MD;  Location: [facility];  Service: General;  Laterality: Right;   CESAREAN SECTION     CYSTOSCOPY/RETROGRADE/URETEROSCOPY Right 11/23/2016   Procedure: CYSTOSCOPY/RIGHT RETROGRADE/URETEROSCOPY LASER LITHOTRIPSY AND STONE REMOVAL ;  Surgeon: Renda Glance, MD;  Location: [facility];  Service: Urology;  Laterality: Right;   Kidney stones     TUBAL LIGATION      Home Medications:  Medications Prior to Admission  Medication Sig Dispense Refill Last Dose/Taking   blood glucose meter kit and supplies Dispense based on patient and insurance preference. Use up to four times daily as directed. (FOR ICD-10 E10.9, E11.9). 1 each 0    fexofenadine  (ALLEGRA ) 180 MG tablet Take 1 tablet (180 mg total) by mouth daily. 90 tablet 1    gabapentin  (NEURONTIN ) 300 MG capsule Take 1 capsule (300 mg total) by mouth at bedtime. 90 capsule 0    levothyroxine  (SYNTHROID ) 25 MCG tablet TAKE 1 TABLET BY MOUTH BEFORE BREAKFAST (Patient not taking: Reported on 05/09/2024) 90 tablet 3    methocarbamol  (ROBAXIN ) 500 MG tablet Take 1 tablet (500 mg total) by mouth 3 (three) times daily. 90 tablet 0    OZEMPIC , 0.25 OR 0.5 MG/DOSE, 2 MG/3ML SOPN  Inject into the skin.      rosuvastatin  (CRESTOR ) 20 MG tablet Take 1 tablet (20 mg total) by mouth daily. 90 tablet 0    Allergies: Allergies[1]  Family History  Problem Relation Age of Onset   Hypertension Mother    Heart disease Father    Heart disease Brother    Hypertension Brother    Hypertension Brother    Social History:  reports that she has never smoked. She has never used smokeless tobacco. She reports current alcohol use. She reports that she does not use drugs.  ROS: A complete review of systems was performed.  All systems are negative except for pertinent findings as noted. Review of Systems  All other systems reviewed and are negative.    Physical Exam:  Vital signs in last 24 hours: Temp:  [97.5 F (36.4 C)] 97.5 F (36.4 C) (01/12 0815) Pulse Rate:  [82] 82 (01/12 0815) Resp:  [16] 16 (01/12 0815) BP: (172)/(79) 172/79 (01/12 0815) SpO2:  [100 %] 100 % (01/12 0815) Weight:  [74.8 kg] 74.8 kg (01/12 0815) General:  Alert and oriented, No acute distress HEENT: Normocephalic, atraumatic Cardiovascular: Regular rate and rhythm Lungs: Regular rate and effort Abdomen: Soft, nontender, nondistended, no abdominal masses Back: No CVA tenderness Extremities: No edema Neurologic: Grossly intact  Laboratory Data:  No results found for this or any previous visit (from the past 24 hours). Recent Results (from the past 240  hours)  Urine Culture     Status: None   Collection Time: 05/09/24 11:26 AM   Specimen: Urine   UR  Result Value Ref Range Status   Urine Culture, Routine Final report  Final   Organism ID, Bacteria Comment  Final    Comment: Mixed urogenital flora Less than 10,000 colonies/mL   Microscopic Examination     Status: Abnormal   Collection Time: 05/09/24 11:26 AM   Urine  Result Value Ref Range Status   WBC, UA 0-5 0 - 5 /hpf Final   RBC, Urine None seen 0 - 2 /hpf Final   Epithelial Cells (non renal) None seen 0 - 10 /hpf Final   Renal  Epithel, UA None seen None seen /hpf Final   Bacteria, UA Few (A) None seen/Few Final   Yeast, UA None seen None seen Final   Creatinine: Recent Labs    05/09/24 1510  CREATININE 1.70*    Impression/Assessment:  Left ureteral stone -   Plan:  Ivionna has presented today for surgery, with the diagnosis of LEFT URETERAL STONEs. The various methods of treatment have been discussed with the patient and family. After consideration of risks, benefits and other options for treatment, the patient has consented to  Procedures with comments: LITHOTRIPSY, ESWL (Left) - LEFT EXTRACORPOREAL SHOCKWAVE LITHOTRIPSY as a surgical intervention.     Donnice Brooks 05/13/2024     [1]  Allergies Allergen Reactions   Other Itching and Other (See Comments)   Atorvastatin  Other (See Comments)   Augmentin [Amoxicillin-Pot Clavulanate] Other (See Comments)    Thrush   Bactrim  [Sulfamethoxazole -Trimethoprim ]     rash with itching   Lipitor [Atorvastatin  Calcium ] Other (See Comments)    Leg pain   Niaspan [Niacin Er (Antihyperlipidemic)] Other (See Comments)    Burning, itching skin.

## 2024-05-14 ENCOUNTER — Encounter (HOSPITAL_COMMUNITY): Admission: RE | Disposition: A | Payer: Self-pay | Source: Home / Self Care | Attending: Urology

## 2024-05-14 ENCOUNTER — Ambulatory Visit (HOSPITAL_COMMUNITY): Payer: Self-pay | Admitting: Anesthesiology

## 2024-05-14 ENCOUNTER — Ambulatory Visit (HOSPITAL_COMMUNITY)

## 2024-05-14 ENCOUNTER — Encounter (HOSPITAL_COMMUNITY): Payer: Self-pay | Admitting: Urology

## 2024-05-14 ENCOUNTER — Ambulatory Visit (HOSPITAL_COMMUNITY): Admission: RE | Admit: 2024-05-14 | Discharge: 2024-05-14 | Disposition: A | Attending: Urology | Admitting: Urology

## 2024-05-14 ENCOUNTER — Encounter (HOSPITAL_COMMUNITY): Payer: Self-pay | Admitting: Anesthesiology

## 2024-05-14 DIAGNOSIS — I10 Essential (primary) hypertension: Secondary | ICD-10-CM | POA: Insufficient documentation

## 2024-05-14 DIAGNOSIS — E119 Type 2 diabetes mellitus without complications: Secondary | ICD-10-CM | POA: Insufficient documentation

## 2024-05-14 DIAGNOSIS — Z79890 Hormone replacement therapy (postmenopausal): Secondary | ICD-10-CM | POA: Insufficient documentation

## 2024-05-14 DIAGNOSIS — N202 Calculus of kidney with calculus of ureter: Secondary | ICD-10-CM | POA: Insufficient documentation

## 2024-05-14 DIAGNOSIS — E039 Hypothyroidism, unspecified: Secondary | ICD-10-CM | POA: Diagnosis not present

## 2024-05-14 DIAGNOSIS — N201 Calculus of ureter: Secondary | ICD-10-CM | POA: Diagnosis present

## 2024-05-14 HISTORY — PX: CYSTOSCOPY/URETEROSCOPY/HOLMIUM LASER/STENT PLACEMENT: SHX6546

## 2024-05-14 LAB — BASIC METABOLIC PANEL WITH GFR
Anion gap: 10 (ref 5–15)
BUN: 27 mg/dL — ABNORMAL HIGH (ref 8–23)
CO2: 24 mmol/L (ref 22–32)
Calcium: 9.4 mg/dL (ref 8.9–10.3)
Chloride: 105 mmol/L (ref 98–111)
Creatinine, Ser: 1.61 mg/dL — ABNORMAL HIGH (ref 0.44–1.00)
GFR, Estimated: 35 mL/min — ABNORMAL LOW
Glucose, Bld: 106 mg/dL — ABNORMAL HIGH (ref 70–99)
Potassium: 3.8 mmol/L (ref 3.5–5.1)
Sodium: 139 mmol/L (ref 135–145)

## 2024-05-14 LAB — GLUCOSE, CAPILLARY
Glucose-Capillary: 110 mg/dL — ABNORMAL HIGH (ref 70–99)
Glucose-Capillary: 96 mg/dL (ref 70–99)

## 2024-05-14 LAB — HEMOGLOBIN A1C
Hgb A1c MFr Bld: 5.4 % (ref 4.8–5.6)
Mean Plasma Glucose: 108.28 mg/dL

## 2024-05-14 MED ORDER — TAMSULOSIN HCL 0.4 MG PO CAPS: 0.4000 mg | ORAL_CAPSULE | Freq: Every day | ORAL | 0 refills | Status: AC

## 2024-05-14 MED ORDER — CIPROFLOXACIN IN D5W 400 MG/200ML IV SOLN
400.0000 mg | Freq: Once | INTRAVENOUS | Status: AC
  Administered 2024-05-14: 400 mg via INTRAVENOUS
  Filled 2024-05-14: qty 200

## 2024-05-14 MED ORDER — CHLORHEXIDINE GLUCONATE 0.12 % MT SOLN
15.0000 mL | Freq: Once | OROMUCOSAL | Status: AC
  Administered 2024-05-14: 15 mL via OROMUCOSAL

## 2024-05-14 MED ORDER — ONDANSETRON HCL 4 MG/2ML IJ SOLN
INTRAMUSCULAR | Status: AC
  Filled 2024-05-14: qty 2

## 2024-05-14 MED ORDER — FENTANYL CITRATE (PF) 50 MCG/ML IJ SOSY: 25.0000 ug | PREFILLED_SYRINGE | INTRAMUSCULAR | Status: DC | PRN

## 2024-05-14 MED ORDER — MIDAZOLAM HCL 2 MG/2ML IJ SOLN
INTRAMUSCULAR | Status: AC
  Filled 2024-05-14: qty 2

## 2024-05-14 MED ORDER — PROPOFOL 10 MG/ML IV BOLUS
INTRAVENOUS | Status: AC
  Filled 2024-05-14: qty 20

## 2024-05-14 MED ORDER — ONDANSETRON HCL 4 MG/2ML IJ SOLN: 4.0000 mg | Freq: Once | INTRAMUSCULAR | Status: DC | PRN

## 2024-05-14 MED ORDER — FENTANYL CITRATE (PF) 100 MCG/2ML IJ SOLN
INTRAMUSCULAR | Status: DC | PRN
  Administered 2024-05-14: 50 ug via INTRAVENOUS

## 2024-05-14 MED ORDER — INSULIN ASPART 100 UNIT/ML IJ SOLN: 0.0000 [IU] | INTRAMUSCULAR | Status: DC | PRN

## 2024-05-14 MED ORDER — IOHEXOL 300 MG/ML  SOLN
INTRAMUSCULAR | Status: DC | PRN
  Administered 2024-05-14: 10 mL

## 2024-05-14 MED ORDER — LIDOCAINE HCL (CARDIAC) PF 100 MG/5ML IV SOSY
PREFILLED_SYRINGE | INTRAVENOUS | Status: DC | PRN
  Administered 2024-05-14: 80 mg via INTRAVENOUS

## 2024-05-14 MED ORDER — AMISULPRIDE (ANTIEMETIC) 5 MG/2ML IV SOLN: 10.0000 mg | Freq: Once | INTRAVENOUS | Status: DC | PRN

## 2024-05-14 MED ORDER — NITROFURANTOIN MONOHYD MACRO 100 MG PO CAPS: 100.0000 mg | ORAL_CAPSULE | Freq: Two times a day (BID) | ORAL | 0 refills | Status: AC

## 2024-05-14 MED ORDER — CELECOXIB 200 MG PO CAPS: 200.0000 mg | ORAL_CAPSULE | Freq: Two times a day (BID) | ORAL | 1 refills | Status: AC

## 2024-05-14 MED ORDER — SODIUM CHLORIDE 0.9 % IR SOLN
Status: DC | PRN
  Administered 2024-05-14: 3000 mL via INTRAVESICAL

## 2024-05-14 MED ORDER — ONDANSETRON HCL 4 MG/2ML IJ SOLN
INTRAMUSCULAR | Status: DC | PRN
  Administered 2024-05-14: 4 mg via INTRAVENOUS

## 2024-05-14 MED ORDER — PROPOFOL 10 MG/ML IV BOLUS
INTRAVENOUS | Status: DC | PRN
  Administered 2024-05-14: 150 mg via INTRAVENOUS

## 2024-05-14 MED ORDER — MIDAZOLAM HCL 5 MG/5ML IJ SOLN
INTRAMUSCULAR | Status: DC | PRN
  Administered 2024-05-14 (×2): 1 mg via INTRAVENOUS

## 2024-05-14 MED ORDER — ACETAMINOPHEN 500 MG PO TABS
1000.0000 mg | ORAL_TABLET | Freq: Once | ORAL | Status: AC
  Administered 2024-05-14: 1000 mg via ORAL
  Filled 2024-05-14: qty 2

## 2024-05-14 MED ORDER — TRAMADOL HCL 50 MG PO TABS: 50.0000 mg | ORAL_TABLET | Freq: Four times a day (QID) | ORAL | 0 refills | Status: AC | PRN

## 2024-05-14 MED ORDER — ORAL CARE MOUTH RINSE: 15.0000 mL | Freq: Once | OROMUCOSAL | Status: AC

## 2024-05-14 MED ORDER — LIDOCAINE HCL (PF) 2 % IJ SOLN
INTRAMUSCULAR | Status: AC
  Filled 2024-05-14: qty 5

## 2024-05-14 MED ORDER — LACTATED RINGERS IV SOLN: INTRAVENOUS | Status: DC

## 2024-05-14 MED ORDER — FENTANYL CITRATE (PF) 100 MCG/2ML IJ SOLN
INTRAMUSCULAR | Status: AC
  Filled 2024-05-14: qty 2

## 2024-05-14 MED ORDER — DEXAMETHASONE SOD PHOSPHATE PF 10 MG/ML IJ SOLN
INTRAMUSCULAR | Status: DC | PRN
  Administered 2024-05-14: 10 mg via INTRAVENOUS

## 2024-05-14 MED ORDER — DEXAMETHASONE SOD PHOSPHATE PF 10 MG/ML IJ SOLN
INTRAMUSCULAR | Status: AC
  Filled 2024-05-14: qty 1

## 2024-05-14 NOTE — Op Note (Signed)
 Operative Note  Preoperative diagnosis:  1.  Left ureteral and renal stone  Postoperative diagnosis: 1.  same  Procedure(s): 1.  Left ureteroscopy with laser lithotripsy of stones 2. Left retrograde pyelogram 3. Left ureteral stent placement  Surgeon: Herlene Foot, MD  Assistants:  None  Anesthesia:  General  Complications:  None  EBL: Minimal  Specimens: 1. none  Drains/Catheters: 1.  Left 6Fr x 24cm ureteral stent with string  Intraoperative findings:   Cystoscopy demonstrated unremarkable bladder Left Ureteroscopy demonstrated stone had been pushed into kidney. There was another stone in the lower pole. Both were completely dusted Successful stent placement.  Left Retrograde Pyelogram: filling defect in proximal ureter from stone. Mild hydronephrosis   Description of procedure: After informed consent was obtained from the patient, the patient was identified and taken to the operating room and placed in the supine position.  General anesthesia was administered as well as perioperative IV antibiotics.  At the beginning of the case, a time-out was performed to properly identify the patient, the surgery to be performed, and the surgical site.  Sequential compression devices were applied to the lower extremities at the beginning of the case for DVT prophylaxis.  The patient was then placed in the dorsal lithotomy supine position, prepped and draped in sterile fashion.  We then passed the 21-French rigid cystoscope through the urethra and into the bladder under vision without any difficulty.  A systematic evaluation of the bladder revealed no evidence of any suspicious bladder lesions.  Ureteral orifices were in normal position.     The left UO was identified and cannulated with a sensor wire, which was passed up to the level of the renal pelvis.   The cystoscope was withdrawn, and a dual lumen catheter was inserted over the glide wire into the distal ureter. A gentle  retrograde pyelogram was performed, findings as above. A 0.035 zip wire was then passed up to the level of the renal pelvis and secured to the drape as a safety wire. The dual lumen was removed.  The flexible ureteroscope was advanced into the collecting system via the sensor wire. The collecting system was inspected. The calculi were identified as detailed above. Using the 242 micron holmium laser fiber, the stone was dusted completely. These were sent for chemical analysis. With the ureteroscope in the kidney, a gentle pyelogram was performed to delineate the calyceal system and we evaluated the calyces systematically. We encountered no further stones. The rest of the stone fragments were very tiny and these were  irrigated away gently. The calyces were re-inspected and there were no significant stone fragment residual.   We then withdrew the ureteroscope back down the ureter along with the access sheath, noting no evidence of any stones along the course of the ureter.  Prior to removing the ureteroscope, we did pass the Glidewire back up to the ureter to the renal pelvis.  Once the ureteroscope was removed, we then used the Glidewire under fluoroscopic guidance and passed up a 6-French x 24 cm double-pigtail ureteral stent up the ureter, making sure that the proximal and distal ends coiled within the kidney and bladder respectively.  Note that we left a tether attached to the distal end of the ureteral stent.    The patient tolerated the procedure well and there was no complication. Patient was awoken from anesthesia and taken to the recovery room in stable condition. I was present and scrubbed for the entirety of the case.  Plan:  Patient  will be discharged home and may remove stent on Monday given extent of debris   G. Herlene Foot MD [facility]  Pager: [phone]

## 2024-05-14 NOTE — Anesthesia Procedure Notes (Signed)
 Procedure Name: LMA Insertion Date/Time: 05/14/2024 2:05 PM  Performed by: Kathern Rollene LABOR, CRNAPre-anesthesia Checklist: Patient identified, Emergency Drugs available, Suction available and Patient being monitored Patient Re-evaluated:Patient Re-evaluated prior to induction Oxygen Delivery Method: Circle system utilized Preoxygenation: Pre-oxygenation with 100% oxygen Induction Type: IV induction Ventilation: Mask ventilation without difficulty LMA: LMA inserted LMA Size: 4.0 Tube type: Oral Number of attempts: 1 Placement Confirmation: positive ETCO2 and breath sounds checked- equal and bilateral Tube secured with: Tape Dental Injury: Teeth and Oropharynx as per pre-operative assessment

## 2024-05-14 NOTE — Interval H&P Note (Signed)
 History and Physical Interval Note:  05/14/2024 2:15 PM  Alexandria Tucker  has presented today for surgery, with the diagnosis of LEFT URETERAL STONE.  The various methods of treatment have been discussed with the patient and family. After consideration of risks, benefits and other options for treatment, the patient has consented to  Procedures: CYSTOSCOPY/URETEROSCOPY/HOLMIUM LASER/STENT PLACEMENT (Left) as a surgical intervention.  The patient's history has been reviewed, patient examined, no change in status, stable for surgery.  I have reviewed the patient's chart and labs.  Questions were answered to the patient's satisfaction.     Righteous Claiborne L Kalyse Meharg

## 2024-05-14 NOTE — Anesthesia Preprocedure Evaluation (Addendum)
"                                    Anesthesia Evaluation  Patient identified by MRN, date of birth, ID band Patient awake    Reviewed: Allergy & Precautions, NPO status , Patient's Chart, lab work & pertinent test results  Airway Mallampati: II  TM Distance: >3 FB Neck ROM: Full    Dental  (+) Dental Advisory Given, Missing   Pulmonary neg pulmonary ROS   Pulmonary exam normal breath sounds clear to auscultation       Cardiovascular hypertension, Normal cardiovascular exam Rhythm:Regular Rate:Normal     Neuro/Psych negative neurological ROS     GI/Hepatic Neg liver ROS,GERD  ,,  Endo/Other  diabetes, Type 2Hypothyroidism    Renal/GU Renal disease (LEFT URETERAL STONE)     Musculoskeletal negative musculoskeletal ROS (+)    Abdominal   Peds  Hematology negative hematology ROS (+)   Anesthesia Other Findings Day of surgery medications reviewed with the patient.  Reproductive/Obstetrics                              Anesthesia Physical Anesthesia Plan  ASA: 2  Anesthesia Plan: General   Post-op Pain Management: Tylenol  PO (pre-op)* and Toradol  IV (intra-op)*   Induction: Intravenous  PONV Risk Score and Plan: 4 or greater and Midazolam , Dexamethasone  and Ondansetron   Airway Management Planned: LMA  Additional Equipment:   Intra-op Plan:   Post-operative Plan: Extubation in OR  Informed Consent: I have reviewed the patients History and Physical, chart, labs and discussed the procedure including the risks, benefits and alternatives for the proposed anesthesia with the patient or authorized representative who has indicated his/her understanding and acceptance.     Dental advisory given  Plan Discussed with: CRNA  Anesthesia Plan Comments:          Anesthesia Quick Evaluation  "

## 2024-05-14 NOTE — Transfer of Care (Signed)
 Immediate Anesthesia Transfer of Care Note  Patient: Alexandria Tucker  Procedure(s) Performed: CYSTOSCOPY/URETEROSCOPY/HOLMIUM LASER/STENT PLACEMENT (Left)  Patient Location: PACU  Anesthesia Type:General  Level of Consciousness: awake, alert , and oriented  Airway & Oxygen Therapy: Patient Spontanous Breathing  Post-op Assessment: Report given to RN and Post -op Vital signs reviewed and stable  Post vital signs: Reviewed and stable  Last Vitals:  Vitals Value Taken Time  BP 144/78 05/14/24 15:30  Temp    Pulse 88 05/14/24 15:37  Resp 15 05/14/24 15:37  SpO2 100 % 05/14/24 15:37  Vitals shown include unfiled device data.  Last Pain:  Vitals:   05/14/24 1241  TempSrc: Oral  PainSc: 5       Patients Stated Pain Goal: 4 (05/14/24 1241)  Complications: No notable events documented.

## 2024-05-14 NOTE — Anesthesia Postprocedure Evaluation (Signed)
"   Anesthesia Post Note  Patient: Alexandria Tucker  Procedure(s) Performed: CYSTOSCOPY/URETEROSCOPY/HOLMIUM LASER/STENT PLACEMENT (Left)     Patient location during evaluation: PACU Anesthesia Type: General Level of consciousness: awake and alert Pain management: pain level controlled Vital Signs Assessment: post-procedure vital signs reviewed and stable Respiratory status: spontaneous breathing, nonlabored ventilation and respiratory function stable Cardiovascular status: blood pressure returned to baseline and stable Postop Assessment: no apparent nausea or vomiting Anesthetic complications: no   No notable events documented.  Last Vitals:  Vitals:   05/14/24 1600 05/14/24 1625  BP: (!) 147/74 (!) 143/78  Pulse:  73  Resp:  15  Temp:  36.5 C  SpO2:  99%    Last Pain:  Vitals:   05/14/24 1625  TempSrc:   PainSc: 0-No pain                 Garnette FORBES Skillern      "

## 2024-05-15 ENCOUNTER — Encounter (HOSPITAL_COMMUNITY): Payer: Self-pay | Admitting: Urology

## 2024-05-20 ENCOUNTER — Telehealth: Payer: Self-pay

## 2024-05-20 NOTE — Telephone Encounter (Signed)
 Patient states she had a lithotripsy on 1/13 and wanted to make sure it was okay to proceed with injection on 1/22. I spoke with Duwaine Pouch and she stated there should be no problem with proceeding but can contact the nephrologist just to make sure.

## 2024-05-23 ENCOUNTER — Other Ambulatory Visit: Payer: Self-pay

## 2024-05-23 ENCOUNTER — Ambulatory Visit: Admitting: Physical Medicine and Rehabilitation

## 2024-05-23 VITALS — BP 154/84 | HR 74

## 2024-05-23 DIAGNOSIS — M5416 Radiculopathy, lumbar region: Secondary | ICD-10-CM

## 2024-05-23 MED ORDER — METHYLPREDNISOLONE ACETATE 40 MG/ML IJ SUSP
40.0000 mg | Freq: Once | INTRAMUSCULAR | Status: AC
  Administered 2024-05-23: 40 mg

## 2024-05-23 NOTE — Progress Notes (Signed)
 "  Alexandria Tucker - 69 y.o. female MRN [id number]  Date of birth: [birth year]  Office Visit Note: Visit Date: 05/23/2024 PCP: Lavell Bari DELENA, FNP Referred by: Lavell Bari DELENA, FNP  Subjective: Chief Complaint  Patient presents with   Lower Back - Pain   HPI:  Alexandria Tucker is a 69 y.o. female who comes in today at the request of Duwaine Pouch, FNP for planned Right L4-5 Lumbar Interlaminar epidural steroid injection with fluoroscopic guidance.  The patient has failed conservative care including home exercise, medications, time and activity modification.  This injection will be diagnostic and hopefully therapeutic.  Please see requesting physician notes for further details and justification.   ROS Otherwise per HPI.  Assessment & Plan: Visit Diagnoses:    ICD-10-CM   1. Lumbar radiculopathy  M54.16 XR C-ARM NO REPORT    Epidural Steroid injection    methylPREDNISolone  acetate (DEPO-MEDROL ) injection 40 mg      Plan: No additional findings.   Meds & Orders:  Meds ordered this encounter  Medications   methylPREDNISolone  acetate (DEPO-MEDROL ) injection 40 mg    Orders Placed This Encounter  Procedures   XR C-ARM NO REPORT   Epidural Steroid injection    Follow-up: Return for visit to requesting provider as needed.   Procedures: No procedures performed  Lumbar Epidural Steroid Injection - Interlaminar Approach with Fluoroscopic Guidance  Patient: Alexandria Tucker      Date of Birth: [birth year] MRN: [id number] PCP: Lavell Bari DELENA, FNP      Visit Date: 05/23/2024   Universal Protocol:     Consent Given By: the patient  Position: PRONE  Additional Comments: Vital signs were monitored before and after the procedure. Patient was prepped and draped in the usual sterile fashion. The correct patient, procedure, and site was verified.   Injection Procedure Details:   Procedure diagnoses: Lumbar radiculopathy [M54.16]   Meds Administered:  Meds ordered this  encounter  Medications   methylPREDNISolone  acetate (DEPO-MEDROL ) injection 40 mg     Laterality: Right  Location/Site:  L4-5  Needle: 3.5 in., 20 ga. Tuohy  Needle Placement: Paramedian epidural  Findings:   -Comments: Excellent flow of contrast into the epidural space.  Procedure Details: Using a paramedian approach from the side mentioned above, the region overlying the inferior lamina was localized under fluoroscopic visualization and the soft tissues overlying this structure were infiltrated with 4 ml. of 1% Lidocaine  without Epinephrine . The Tuohy needle was inserted into the epidural space using a paramedian approach.   The epidural space was localized using loss of resistance along with counter oblique bi-planar fluoroscopic views.  After negative aspirate for air, blood, and CSF, a 2 ml. volume of Isovue-250 was injected into the epidural space and the flow of contrast was observed. Radiographs were obtained for documentation purposes.    The injectate was administered into the level noted above.   Additional Comments:  The patient tolerated the procedure well Dressing: 2 x 2 sterile gauze and Band-Aid    Post-procedure details: Patient was observed during the procedure. Post-procedure instructions were reviewed.  Patient left the clinic in stable condition.   Clinical History: EXAM: MRI LUMBAR SPINE WITHOUT CONTRAST   TECHNIQUE: Multiplanar, multisequence MR imaging of the lumbar spine was performed. No intravenous contrast was administered.   COMPARISON:  Lumbar spine radiographs 06/29/2022 and abdominopelvic CT 11/21/2016. Report only from lumbar MRI 07/05/2013.   FINDINGS: Segmentation: Transitional lumbosacral anatomy. In keeping with the numbering applied to the  previous lumbar MRI, the transitional segment is assigned L5 and there are vestigial ribs at T12.   Alignment: Mild convex left scoliosis. There is 3 mm of degenerative anterolisthesis at  L4-5.   Vertebrae: No worrisome osseous lesion, acute fracture or pars defect. There are mild sacroiliac degenerative changes bilaterally.   Conus medullaris: Extends to the L1 level and appears normal.   Paraspinal and other soft tissues: No significant paraspinal findings. Small renal sinus cysts bilaterally, similar to previous CT; no follow-up imaging recommended. Small dependent gallstones.   Disc levels:   T12-L1: Chronic spondylosis with loss of disc height and a calcified central disc protrusion. This appears unchanged from previous CT. There is chronic mild mass effect on the thecal sac, but no significant exiting T12 nerve root encroachment.   L1-2: Disc height and hydration are maintained. Minimal disc bulging. No spinal stenosis or nerve root encroachment.   L2-3: Preserved disc height with mild disc bulging and facet hypertrophy. No significant spinal stenosis or nerve root encroachment.   L3-4: Mild loss of disc height with disc bulging, facet and ligamentous hypertrophy. Resulting mild spinal stenosis with mild asymmetric right lateral recess and right foraminal narrowing, similar to previous abdominal CT.   L4-5: Mild loss of disc height with annular disc bulging and moderate to advanced bilateral facet hypertrophy. Resulting mild multifactorial spinal stenosis with mild lateral recess and foraminal narrowing bilaterally.   L5-S1: Disc height and hydration are maintained. Mild bilateral facet hypertrophy. No spinal stenosis or nerve root encroachment.   IMPRESSION: 1. Transitional lumbosacral anatomy. In keeping with the numbering applied to the previous lumbar MRI, the transitional segment is assigned L5 and there are vestigial ribs at T12. 2. No acute findings or clear explanation for the patient's symptoms. 3. Mild multifactorial spinal stenosis at L3-4 and L4-5. At L3-4, there is mild asymmetric right lateral recess and right foraminal narrowing. At  L4-5, there is mild lateral recess and foraminal narrowing bilaterally. 4. Chronic calcified central disc protrusion at T12-L1 without significant spinal stenosis or nerve root encroachment. 5. Cholelithiasis.     Electronically Signed   By: Elsie Perone M.D.   On: 07/11/2022 17:03     Objective:  VS:  HT:    WT:   BMI:     BP:(!) 154/84  HR:74bpm  TEMP: ( )  RESP:  Physical Exam Vitals and nursing note reviewed.  Constitutional:      General: She is not in acute distress.    Appearance: Normal appearance. She is not ill-appearing.  HENT:     Head: Normocephalic and atraumatic.     Right Ear: External ear normal.     Left Ear: External ear normal.  Eyes:     Extraocular Movements: Extraocular movements intact.  Cardiovascular:     Rate and Rhythm: Normal rate.     Pulses: Normal pulses.  Pulmonary:     Effort: Pulmonary effort is normal. No respiratory distress.  Abdominal:     General: There is no distension.     Palpations: Abdomen is soft.  Musculoskeletal:        General: Tenderness present.     Cervical back: Neck supple.     Right lower leg: No edema.     Left lower leg: No edema.     Comments: Patient has good distal strength with no pain over the greater trochanters.  No clonus or focal weakness.  Skin:    Findings: No erythema, lesion or rash.  Neurological:  General: No focal deficit present.     Mental Status: She is alert and oriented to person, place, and time.     Sensory: No sensory deficit.     Motor: No weakness or abnormal muscle tone.     Coordination: Coordination normal.  Psychiatric:        Mood and Affect: Mood normal.        Behavior: Behavior normal.      Imaging: No results found. "

## 2024-05-23 NOTE — Progress Notes (Signed)
 Pain Scale   Average Pain 4 Patient advising she has chronic lower back pain that radiates to Right leg.  Patient advising her pain increases when walking and decreases when resting.        +Driver, -BT, -Dye Allergies.

## 2024-05-28 NOTE — Procedures (Signed)
 Lumbar Epidural Steroid Injection - Interlaminar Approach with Fluoroscopic Guidance  Patient: Alexandria Tucker      Date of Birth: 1955/09/19 MRN: [id number] PCP: Lavell Bari DELENA, FNP      Visit Date: 05/23/2024   Universal Protocol:     Consent Given By: the patient  Position: PRONE  Additional Comments: Vital signs were monitored before and after the procedure. Patient was prepped and draped in the usual sterile fashion. The correct patient, procedure, and site was verified.   Injection Procedure Details:   Procedure diagnoses: Lumbar radiculopathy [M54.16]   Meds Administered:  Meds ordered this encounter  Medications   methylPREDNISolone  acetate (DEPO-MEDROL ) injection 40 mg     Laterality: Right  Location/Site:  L4-5  Needle: 3.5 in., 20 ga. Tuohy  Needle Placement: Paramedian epidural  Findings:   -Comments: Excellent flow of contrast into the epidural space.  Procedure Details: Using a paramedian approach from the side mentioned above, the region overlying the inferior lamina was localized under fluoroscopic visualization and the soft tissues overlying this structure were infiltrated with 4 ml. of 1% Lidocaine  without Epinephrine . The Tuohy needle was inserted into the epidural space using a paramedian approach.   The epidural space was localized using loss of resistance along with counter oblique bi-planar fluoroscopic views.  After negative aspirate for air, blood, and CSF, a 2 ml. volume of Isovue-250 was injected into the epidural space and the flow of contrast was observed. Radiographs were obtained for documentation purposes.    The injectate was administered into the level noted above.   Additional Comments:  The patient tolerated the procedure well Dressing: 2 x 2 sterile gauze and Band-Aid    Post-procedure details: Patient was observed during the procedure. Post-procedure instructions were reviewed.  Patient left the clinic in stable  condition.

## 2024-07-01 ENCOUNTER — Ambulatory Visit: Admitting: Family

## 2024-09-26 ENCOUNTER — Ambulatory Visit: Admitting: Family
